# Patient Record
Sex: Female | Born: 1975
Health system: Southern US, Community
[De-identification: ages and names within clinical notes are randomized; demographics above are authoritative.]

## PROBLEM LIST (undated history)

## (undated) DIAGNOSIS — G7100 Muscular dystrophy, unspecified: Secondary | ICD-10-CM

## (undated) DIAGNOSIS — F329 Major depressive disorder, single episode, unspecified: Secondary | ICD-10-CM

## (undated) DIAGNOSIS — F419 Anxiety disorder, unspecified: Secondary | ICD-10-CM

## (undated) DIAGNOSIS — S82899A Other fracture of unspecified lower leg, initial encounter for closed fracture: Secondary | ICD-10-CM

## (undated) DIAGNOSIS — E559 Vitamin D deficiency, unspecified: Secondary | ICD-10-CM

## (undated) DIAGNOSIS — R079 Chest pain, unspecified: Secondary | ICD-10-CM

## (undated) DIAGNOSIS — K589 Irritable bowel syndrome without diarrhea: Secondary | ICD-10-CM

## (undated) DIAGNOSIS — E7800 Pure hypercholesterolemia, unspecified: Secondary | ICD-10-CM

## (undated) DIAGNOSIS — F32A Depression, unspecified: Secondary | ICD-10-CM

## (undated) DIAGNOSIS — D219 Benign neoplasm of connective and other soft tissue, unspecified: Secondary | ICD-10-CM

## (undated) HISTORY — DX: Pure hypercholesterolemia, unspecified: E78.00

## (undated) HISTORY — DX: Chest pain, unspecified: R07.9

## (undated) HISTORY — PX: OTHER SURGICAL HISTORY: SHX169

## (undated) HISTORY — DX: Vitamin D deficiency, unspecified: E55.9

## (undated) HISTORY — DX: Muscular dystrophy, unspecified: G71.00

## (undated) HISTORY — DX: Anxiety disorder, unspecified: F41.9

## (undated) HISTORY — DX: Other fracture of unspecified lower leg, initial encounter for closed fracture: S82.899A

## (undated) HISTORY — DX: Irritable bowel syndrome, unspecified: K58.9

## (undated) HISTORY — DX: Benign neoplasm of connective and other soft tissue, unspecified: D21.9

## (undated) HISTORY — PX: TUBAL LIGATION: SHX77

## (undated) HISTORY — DX: Depression, unspecified: F32.A

## (undated) HISTORY — DX: Major depressive disorder, single episode, unspecified: F32.9

---

## 2012-04-20 LAB — HM PAP SMEAR

## 2012-10-14 ENCOUNTER — Ambulatory Visit: Payer: Self-pay | Admitting: Family Medicine

## 2012-10-16 ENCOUNTER — Encounter: Payer: Self-pay | Admitting: Neurology

## 2012-11-03 ENCOUNTER — Encounter: Payer: Self-pay | Admitting: Neurology

## 2013-01-24 ENCOUNTER — Ambulatory Visit: Payer: Self-pay | Admitting: Unknown Physician Specialty

## 2013-05-27 DIAGNOSIS — G7111 Myotonic muscular dystrophy: Secondary | ICD-10-CM | POA: Insufficient documentation

## 2013-06-17 ENCOUNTER — Ambulatory Visit: Payer: Self-pay | Admitting: Pain Medicine

## 2013-09-17 ENCOUNTER — Emergency Department: Payer: Self-pay | Admitting: Emergency Medicine

## 2013-09-17 LAB — BASIC METABOLIC PANEL
ANION GAP: 5 — AB (ref 7–16)
BUN: 12 mg/dL (ref 7–18)
CALCIUM: 9.4 mg/dL (ref 8.5–10.1)
Chloride: 103 mmol/L (ref 98–107)
Co2: 31 mmol/L (ref 21–32)
Creatinine: 0.63 mg/dL (ref 0.60–1.30)
Glucose: 108 mg/dL — ABNORMAL HIGH (ref 65–99)
Osmolality: 278 (ref 275–301)
Potassium: 3.9 mmol/L (ref 3.5–5.1)
SODIUM: 139 mmol/L (ref 136–145)

## 2013-09-17 LAB — CBC
HCT: 42.6 % (ref 35.0–47.0)
HGB: 14.3 g/dL (ref 12.0–16.0)
MCH: 31 pg (ref 26.0–34.0)
MCHC: 33.5 g/dL (ref 32.0–36.0)
MCV: 92 fL (ref 80–100)
PLATELETS: 218 10*3/uL (ref 150–440)
RBC: 4.61 10*6/uL (ref 3.80–5.20)
RDW: 13.5 % (ref 11.5–14.5)
WBC: 9.2 10*3/uL (ref 3.6–11.0)

## 2013-09-17 LAB — TROPONIN I

## 2013-09-17 LAB — PRO B NATRIURETIC PEPTIDE: B-TYPE NATIURETIC PEPTID: 59 pg/mL (ref 0–125)

## 2013-09-18 LAB — TROPONIN I

## 2013-09-18 LAB — URINALYSIS, COMPLETE
Bilirubin,UR: NEGATIVE
GLUCOSE, UR: NEGATIVE mg/dL (ref 0–75)
KETONE: NEGATIVE
NITRITE: NEGATIVE
PROTEIN: NEGATIVE
Ph: 6 (ref 4.5–8.0)
RBC,UR: 14 /HPF (ref 0–5)
SPECIFIC GRAVITY: 1.02 (ref 1.003–1.030)
Squamous Epithelial: 9
WBC UR: 4 /HPF (ref 0–5)

## 2013-10-16 ENCOUNTER — Emergency Department: Payer: Self-pay | Admitting: Emergency Medicine

## 2013-11-10 ENCOUNTER — Emergency Department: Payer: Self-pay | Admitting: Internal Medicine

## 2013-11-10 LAB — COMPREHENSIVE METABOLIC PANEL
ANION GAP: 4 — AB (ref 7–16)
Albumin: 2.8 g/dL — ABNORMAL LOW (ref 3.4–5.0)
Alkaline Phosphatase: 133 U/L — ABNORMAL HIGH
BILIRUBIN TOTAL: 0.4 mg/dL (ref 0.2–1.0)
BUN: 9 mg/dL (ref 7–18)
CO2: 30 mmol/L (ref 21–32)
Calcium, Total: 9.2 mg/dL (ref 8.5–10.1)
Chloride: 108 mmol/L — ABNORMAL HIGH (ref 98–107)
Creatinine: 0.85 mg/dL (ref 0.60–1.30)
EGFR (African American): >60
GLUCOSE: 108 mg/dL — AB (ref 65–99)
Osmolality: 282 (ref 275–301)
POTASSIUM: 4.2 mmol/L (ref 3.5–5.1)
SGOT(AST): 77 U/L — ABNORMAL HIGH (ref 15–37)
SGPT (ALT): 50 U/L
Sodium: 142 mmol/L (ref 136–145)
Total Protein: 6.4 g/dL (ref 6.4–8.2)

## 2013-11-10 LAB — CBC
HCT: 53.5 % — ABNORMAL HIGH (ref 35.0–47.0)
HGB: 17 g/dL — ABNORMAL HIGH (ref 12.0–16.0)
MCH: 29.4 pg (ref 26.0–34.0)
MCHC: 31.8 g/dL — AB (ref 32.0–36.0)
MCV: 93 fL (ref 80–100)
Platelet: 209 10*3/uL (ref 150–440)
RBC: 5.78 10*6/uL — AB (ref 3.80–5.20)
RDW: 13.5 % (ref 11.5–14.5)
WBC: 12 10*3/uL — ABNORMAL HIGH (ref 3.6–11.0)

## 2013-11-24 ENCOUNTER — Emergency Department: Payer: Self-pay | Admitting: Student

## 2013-11-24 LAB — COMPREHENSIVE METABOLIC PANEL
Albumin: 3 g/dL — ABNORMAL LOW (ref 3.4–5.0)
Alkaline Phosphatase: 98 U/L
Anion Gap: 4 — ABNORMAL LOW (ref 7–16)
BUN: 6 mg/dL — ABNORMAL LOW (ref 7–18)
Bilirubin,Total: 0.5 mg/dL (ref 0.2–1.0)
CO2: 30 mmol/L (ref 21–32)
Calcium, Total: 8.9 mg/dL (ref 8.5–10.1)
Chloride: 108 mmol/L — ABNORMAL HIGH (ref 98–107)
Creatinine: 0.7 mg/dL (ref 0.60–1.30)
Glucose: 88 mg/dL (ref 65–99)
Osmolality: 280 (ref 275–301)
Potassium: 3.9 mmol/L (ref 3.5–5.1)
SGOT(AST): 35 U/L (ref 15–37)
SGPT (ALT): 32 U/L
Sodium: 142 mmol/L (ref 136–145)
TOTAL PROTEIN: 6.3 g/dL — AB (ref 6.4–8.2)

## 2013-11-24 LAB — URINALYSIS, COMPLETE
BILIRUBIN, UR: NEGATIVE
Bacteria: NONE SEEN
Glucose,UR: NEGATIVE mg/dL (ref 0–75)
KETONE: NEGATIVE
NITRITE: NEGATIVE
Ph: 7 (ref 4.5–8.0)
Protein: NEGATIVE
RBC,UR: 1 /HPF (ref 0–5)
Specific Gravity: 1.01 (ref 1.003–1.030)

## 2013-11-24 LAB — CBC
HCT: 43.6 % (ref 35.0–47.0)
HGB: 13.9 g/dL (ref 12.0–16.0)
MCH: 29.2 pg (ref 26.0–34.0)
MCHC: 31.9 g/dL — ABNORMAL LOW (ref 32.0–36.0)
MCV: 92 fL (ref 80–100)
Platelet: 213 10*3/uL (ref 150–440)
RBC: 4.76 10*6/uL (ref 3.80–5.20)
RDW: 13.5 % (ref 11.5–14.5)
WBC: 8 10*3/uL (ref 3.6–11.0)

## 2013-11-24 LAB — LIPASE, BLOOD: Lipase: 93 U/L (ref 73–393)

## 2013-11-24 LAB — HCG, QUANTITATIVE, PREGNANCY: Beta Hcg, Quant.: <1 m[IU]/mL — ABNORMAL LOW

## 2013-11-25 LAB — URINE CULTURE

## 2013-12-20 ENCOUNTER — Emergency Department (HOSPITAL_COMMUNITY)
Admission: EM | Admit: 2013-12-20 | Discharge: 2013-12-20 | Disposition: A | Discharge status: Home / Self Care | Payer: Medicare Other | Attending: Emergency Medicine | Admitting: Emergency Medicine

## 2013-12-20 ENCOUNTER — Encounter (HOSPITAL_COMMUNITY): Payer: Self-pay | Admitting: Emergency Medicine

## 2013-12-20 DIAGNOSIS — Z9851 Tubal ligation status: Secondary | ICD-10-CM | POA: Insufficient documentation

## 2013-12-20 DIAGNOSIS — Z3202 Encounter for pregnancy test, result negative: Secondary | ICD-10-CM | POA: Insufficient documentation

## 2013-12-20 DIAGNOSIS — G8929 Other chronic pain: Secondary | ICD-10-CM | POA: Insufficient documentation

## 2013-12-20 DIAGNOSIS — N390 Urinary tract infection, site not specified: Secondary | ICD-10-CM

## 2013-12-20 DIAGNOSIS — R197 Diarrhea, unspecified: Secondary | ICD-10-CM | POA: Insufficient documentation

## 2013-12-20 HISTORY — DX: Muscular dystrophy, unspecified: G71.00

## 2013-12-20 LAB — CBC WITH DIFFERENTIAL/PLATELET
BASOS ABS: 0 10*3/uL (ref 0.0–0.1)
Basophils Relative: 0 % (ref 0–1)
EOS ABS: 0.1 10*3/uL (ref 0.0–0.7)
EOS PCT: 1 % (ref 0–5)
HCT: 40.7 % (ref 36.0–46.0)
Hemoglobin: 13.6 g/dL (ref 12.0–15.0)
Lymphocytes Relative: 37 % (ref 12–46)
Lymphs Abs: 3.3 10*3/uL (ref 0.7–4.0)
MCH: 30 pg (ref 26.0–34.0)
MCHC: 33.4 g/dL (ref 30.0–36.0)
MCV: 89.8 fL (ref 78.0–100.0)
MONOS PCT: 6 % (ref 3–12)
Monocytes Absolute: 0.5 10*3/uL (ref 0.1–1.0)
NEUTROS PCT: 56 % (ref 43–77)
Neutro Abs: 4.9 10*3/uL (ref 1.7–7.7)
PLATELETS: 215 10*3/uL (ref 150–400)
RBC: 4.53 MIL/uL (ref 3.87–5.11)
RDW: 13.4 % (ref 11.5–15.5)
WBC: 8.7 10*3/uL (ref 4.0–10.5)

## 2013-12-20 LAB — URINALYSIS, ROUTINE W REFLEX MICROSCOPIC
BILIRUBIN URINE: NEGATIVE
GLUCOSE, UA: NEGATIVE mg/dL
KETONES UR: NEGATIVE mg/dL
Leukocytes, UA: NEGATIVE
Nitrite: NEGATIVE
Protein, ur: NEGATIVE mg/dL
Specific Gravity, Urine: 1.025 (ref 1.005–1.030)
Urobilinogen, UA: 0.2 mg/dL (ref 0.0–1.0)
pH: 6 (ref 5.0–8.0)

## 2013-12-20 LAB — URINE MICROSCOPIC-ADD ON

## 2013-12-20 LAB — COMPREHENSIVE METABOLIC PANEL
ALT: 17 U/L (ref 0–35)
AST: 18 U/L (ref 0–37)
Albumin: 3 g/dL — ABNORMAL LOW (ref 3.5–5.2)
Alkaline Phosphatase: 76 U/L (ref 39–117)
Anion gap: 5 (ref 5–15)
BILIRUBIN TOTAL: 0.2 mg/dL — AB (ref 0.3–1.2)
BUN: 8 mg/dL (ref 6–23)
CO2: 31 mEq/L (ref 19–32)
CREATININE: 0.77 mg/dL (ref 0.50–1.10)
Calcium: 8.8 mg/dL (ref 8.4–10.5)
Chloride: 105 mEq/L (ref 96–112)
GFR calc Af Amer: >90 mL/min (ref >90)
Glucose, Bld: 96 mg/dL (ref 70–99)
Potassium: 3.4 mEq/L — ABNORMAL LOW (ref 3.7–5.3)
Sodium: 141 mEq/L (ref 137–147)
Total Protein: 5.6 g/dL — ABNORMAL LOW (ref 6.0–8.3)

## 2013-12-20 LAB — LIPASE, BLOOD: Lipase: 29 U/L (ref 11–59)

## 2013-12-20 LAB — POC URINE PREG, ED: PREG TEST UR: NEGATIVE

## 2013-12-20 MED ORDER — ONDANSETRON HCL 4 MG/2ML IJ SOLN
4.0000 mg | Freq: Once | INTRAMUSCULAR | Status: AC
  Administered 2013-12-20: 4 mg via INTRAVENOUS
  Filled 2013-12-20: qty 2

## 2013-12-20 MED ORDER — SODIUM CHLORIDE 0.9 % IV SOLN
Freq: Once | INTRAVENOUS | Status: AC
  Administered 2013-12-20: 13:00:00 via INTRAVENOUS

## 2013-12-20 MED ORDER — HYDROCODONE-ACETAMINOPHEN 5-325 MG PO TABS: ORAL_TABLET | ORAL | Status: DC

## 2013-12-20 MED ORDER — MORPHINE SULFATE 4 MG/ML IJ SOLN
4.0000 mg | Freq: Once | INTRAMUSCULAR | Status: AC
  Administered 2013-12-20: 4 mg via INTRAVENOUS
  Filled 2013-12-20: qty 1

## 2013-12-20 MED ORDER — KETOROLAC TROMETHAMINE 30 MG/ML IJ SOLN
30.0000 mg | Freq: Once | INTRAMUSCULAR | Status: AC
  Administered 2013-12-20: 30 mg via INTRAVENOUS
  Filled 2013-12-20: qty 1

## 2013-12-20 MED ORDER — CEPHALEXIN 500 MG PO CAPS: 500.0000 mg | ORAL_CAPSULE | Freq: Four times a day (QID) | ORAL | Status: DC

## 2013-12-20 NOTE — Discharge Instructions (Signed)
Chronic Pain Chronic pain can be defined as pain that is off and on and lasts for 3-6 months or longer. Many things cause chronic pain, which can make it difficult to make a diagnosis. There are many treatment options available for chronic pain. However, finding a treatment that works well for you may require trying various approaches until the right one is found. Many people benefit from a combination of two or more types of treatment to control their pain. SYMPTOMS  Chronic pain can occur anywhere in the body and can range from mild to very severe. Some types of chronic pain include:  Headache.  Low back pain.  Cancer pain.  Arthritis pain.  Neurogenic pain. This is pain resulting from damage to nerves. People with chronic pain may also have other symptoms such as:  Depression.  Anger.  Insomnia.  Anxiety. DIAGNOSIS  Your health care provider will help diagnose your condition over time. In many cases, the initial focus will be on excluding possible conditions that could be causing the pain. Depending on your symptoms, your health care provider may order tests to diagnose your condition. Some of these tests may include:   Blood tests.   CT scan.   MRI.   X-rays.   Ultrasounds.   Nerve conduction studies.  You may need to see a specialist.  TREATMENT  Finding treatment that works well may take time. You may be referred to a pain specialist. He or she may prescribe medicine or therapies, such as:   Mindful meditation or yoga.  Shots (injections) of numbing or pain-relieving medicines into the spine or area of pain.  Local electrical stimulation.  Acupuncture.   Massage therapy.   Aroma, color, light, or sound therapy.   Biofeedback.   Working with a physical therapist to keep from getting stiff.   Regular, gentle exercise.   Cognitive or behavioral therapy.   Group support.  Sometimes, surgery may be recommended.  HOME CARE INSTRUCTIONS    Take all medicines as directed by your health care provider.   Lessen stress in your life by relaxing and doing things such as listening to calming music.   Exercise or be active as directed by your health care provider.   Eat a healthy diet and include things such as vegetables, fruits, fish, and lean meats in your diet.   Keep all follow-up appointments with your health care provider.   Attend a support group with others suffering from chronic pain. SEEK MEDICAL CARE IF:   Your pain gets worse.   You develop a new pain that was not there before.   You cannot tolerate medicines given to you by your health care provider.   You have new symptoms since your last visit with your health care provider.  SEEK IMMEDIATE MEDICAL CARE IF:   You feel weak.   You have decreased sensation or numbness.   You lose control of bowel or bladder function.   Your pain suddenly gets much worse.   You develop shaking.  You develop chills.  You develop confusion.  You develop chest pain.  You develop shortness of breath.  MAKE SURE YOU:  Understand these instructions.  Will watch your condition.  Will get help right away if you are not doing well or get worse. Document Released: 11/11/2001 Document Revised: 10/22/2012 Document Reviewed: 08/15/2012 [facility] Patient Information 2015 [place], [place]. This information is not intended to replace advice given to you by your health care provider. Make sure you discuss any  questions you have with your health care provider.  [facility] Primary Care Doctor List    Sinda Du MD. Specialty: Pulmonary Disease Contact information: [place]  [place] [address]  [phone]   Tula Nakayama, MD. Specialty: [facility] Medicine Contact information: [address] [place] [address]  [phone]   Sallee Lange, MD. Specialty: Family Medicine Contact information: [address]   [place] [address]  [phone]   Rosita Fire, MD Specialty: Internal Medicine Contact information: [place] [address]  [phone]   Delphina Cahill, MD. Specialty: Internal Medicine Contact information: [place] [address]  [phone]   Marjean Donna, MD. Specialty: Family Medicine Contact information: [place] [address]  [phone]   Leslie Andrea, MD. Specialty: Family Medicine Contact information: [address]  [place] [address]  [phone]   Asencion Noble, MD. Specialty: Internal Medicine Contact information: [address]  [place] [address]  [phone]    Emergency Department Resource Guide 1) Find a Doctor and Pay Out of Pocket Although you won't have to find out who is covered by your insurance plan, it is a good idea to ask around and get recommendations. You will then need to call the office and see if the doctor you have chosen will accept you as a new patient and what types of options they offer for patients who are self-pay. Some doctors offer discounts or will set up payment plans for their patients who do not have insurance, but you will need to ask so you aren't surprised when you get to your appointment.  2) Contact Your Local Health Department Not all health departments have doctors that can see patients for sick visits, but many do, so it is worth a call to see if yours does. If you don't know where your local health department is, you can check in your phone book. The CDC also has a tool to help you locate your state's health department, and many state websites also have listings of all of their local health departments.  3) Find a [facility] If your illness is not likely to be very severe or complicated, you may want to try a walk in clinic. These are popping up all over the country in pharmacies, drugstores, and shopping centers.  They're usually staffed by nurse practitioners or physician assistants that have been trained to treat common illnesses and complaints. They're usually fairly quick and inexpensive. However, if you have serious medical issues or chronic medical problems, these are probably not your best option.  No Primary Care Doctor: - Call Health Connect at  [phone] - they can help you locate a primary care doctor that  accepts your insurance, provides certain services, etc. - Physician Referral Service- [phone]  Chronic Pain Problems: Organization         Address  Phone   Notes  [facility]  [phone] Patients need to be referred by their primary care doctor.   Medication Assistance: Organization         Address  Phone   Notes  [facility] Medication [facility] [address] [place], [place] [address] [phone] --Must be a resident of [facility] -- Must have NO insurance coverage whatsoever (no Medicaid/ Medicare, etc.) -- The pt. MUST have a primary  care doctor that directs their care regularly and follows them in the community   MedAssist  [phone]   [employer]  [phone]    Agencies that provide inexpensive medical care: Organization         Address  Phone   Notes  [place]  [phone]   [facility]    [phone]   [facility] [place], [place] [address] [phone]   [place] [address] [place] [phone]   Planned Parenthood    [phone]   [facility]    [phone]   [place] and [place] [address], [place] Phone:  [phone], Fax:  [phone] Hours of Operation:  9 am - 6 pm, M-F.  Also accepts Medicaid/Medicare and self-pay.  [facility] for [place], [address], [place] Phone: [phone], Fax: [phone]. Hours of Operation:  8:30 am - 5:30 pm, M-F.  Also accepts Medicaid and self-pay.  [facility] [place] [address] [place] Phone: [phone]   [place], [place], [place] [phone], Ext. [phone] Mondays & Thursdays: 7-9 AM.  First 15 patients are seen on a first come, first serve basis.    [place] Providers:  Organization         Address  Phone   Notes  [facility] [address], [place] [phone] Also accepts self-pay patients.  [facility] [address], [place]  [phone]   [place], [address], [place] [phone]   [facility] [address] [place] [phone]   Lucianne Lei [address], [place]   [phone] Only accepts [place] Access [place] patients after they have their name applied to their card.   Self-Pay (no insurance) in [facility]:  Organization         Address  Phone   Notes  Sickle Cell Patients, [facility] [place] [phone]   [facility] [place] [phone]   [facility] [place]  [place], [place], [place] [phone]   [facility]/Dr. Osei-Bonsu  [address], [place] or [address], [place] [phone] Phone number for both [place] and [place] locations is the same.  Urgent Medical and [facility] [address] [place] [phone]   [facility] [address] [address], [place] or [address] [phone]   [facility] [address] [place] [phone], phone; [phone], fax Sees patients 1st and 3rd Saturday of every month.  Must not qualify for public or private insurance (i.e.  Medicaid, Medicare, [place] Health Choice, Veterans' Benefits)  Household income should be no more than 200% of the poverty level The clinic cannot treat you if you are pregnant or think you are pregnant  Sexually transmitted diseases are not treated at the clinic.    Dental Care: Organization         Address  Phone  Notes  [facility] Department of [facility] [address] [place], [place] [phone]  Accepts children up to age 22 who are enrolled in Medicaid or [place] Health Choice; pregnant women with a Medicaid card; and children who have applied for Medicaid or [place] Health Choice, but were declined, whose parents can pay a reduced fee at time of service.  [facility] Department of [facility]  [address], [place] [phone] Accepts children up to age 73 who are enrolled in [place] or [place]; pregnant women with a Medicaid card; and children who have applied for Medicaid or  Health Choice, but were declined, whose parents can pay a reduced fee at time of service.  [place] Adult Dental Access PROGRAM  [place] [phone] Patients are seen by appointment only. Walk-ins are not accepted. [place] will see patients 55 years of age and older. Monday - Tuesday (8am-5pm) Most Wednesdays (8:30-5pm) $30 per visit, cash only  [facility] Adult Dental Access PROGRAM  [address], [facility] [phone] Patients are seen by appointment only. Walk-ins are not accepted. [facility] will see patients 43 years of age and older. One Wednesday Evening (Monthly: Volunteer Based).  $30 per visit, cash only  [place]  [phone] for adults; Children under age 64, call Graduate Pediatric Dentistry at [phone]. Children aged 39-14, please call [phone] to request a pediatric application.  Dental services are provided in all areas of dental care including fillings,  crowns and bridges, complete and partial dentures, implants, gum treatment, root canals, and extractions. Preventive care is also provided. Treatment is provided to both adults and children. Patients are selected via a lottery and there is often a waiting list.   [facility] [address] [place]  [phone] [email or web address]   [facility] [address] [place], [place] [phone], Ext. [phone] Second and Fourth Thursday of each month, opens at 6:30 AM; Clinic ends at 9 AM.  Patients are seen on a first-come first-served basis, and a limited number are seen during each clinic.   [facility]  [address] Hillard Danker [place], [place] [phone]   Eligibility Requirements You must have lived in [place], [place], or [place] counties for at least the last three months.   You cannot be eligible for state or federal sponsored [employer], including [employer], [place], or [employer].   You generally cannot be eligible for healthcare insurance through your employer.    How to apply: Eligibility screenings are held every Tuesday and Wednesday afternoon from 1:00 pm until 4:00 pm. You do not need an appointment for the interview!  [facility] [address] [place], [place]   [place]  [place] Department  [place]  [phone]    Behavioral Health Resources in the Community: Intensive Outpatient Programs Organization         Address  Phone  Notes  [place]. [address] [place], [place] [phone]   [facility] Outpatient [address] [place], [place]   ADS: Alcohol & Drug Svcs [address], [place], [place]   [place] [address],  [place], [place] or [phone]   Substance Abuse  Resources Organization         Address  Phone  Notes  Alcohol and Drug Services  [phone]  [place]  [phone]   The [place]   Chinita Pester  [phone]   Residential & Outpatient Substance Abuse Program  [phone]   Psychological Services Organization         Address  Phone  Notes  [facility] [place]  [place]  [phone]   [place] [address], [place] or [phone]    Mobile Crisis Teams Organization         Address  Phone  Notes  [facility], Mobile Crisis Care Unit  [phone]   [facility]  [address] [place], [place]   [place] Levels [address], [place] [phone]    Self-Help/Support Groups Organization         Address  Phone             Notes  [place]. of [place] - variety of support groups  [place] Call for more information  Narcotics Anonymous (NA), [facility] [address], [employer] [place]  2 meetings at this location   Special educational needs teacher         Address  Phone  Notes  [facility] Residential Treatment [place],    [place]  [phone]   [facility]  [address], [place] [phone], [place], [place]   [place], [place] [phone] Admissions: 8am-3pm M-F  Incentives Substance [place] [address]    [place], [place] [phone]   [facility] [address] [place], [place], [place]   [facility] [address],  [place], [place]   Insight Programs - Intensive Outpatient [address], [place], [place]   [facility]edic Surgery ([address]ng[address]  [place], [place] [phone] or [phone]   [facility]idential Treatment Services (RTS) [address], [place], [place] Accepts Medicaid  [facility] [place] [address],  [place] [phone] Substance Abuse/Addiction Treatment   [facility] Organization         Address  Phone  Notes  [facility]  [phone]   Domenic Schwab, PhD [address] [place], [place]   [phone] or [phone]   [place], [place] [phone]   [facility] [address], [place], [place] [phone] Insurance/Medicaid/sponsorship through [facility] Families [address], [address] [place]                                    [place], [place] [phone] [place] [address], [place] [phone]    Dr. Adele Schilder  [phone]   [facility] Dept. 1) [address] [place] 2) [place] 3)  [place], [place] [phone]  [phone]   [place] [phone] or [phone] (After Hours)

## 2013-12-20 NOTE — ED Notes (Signed)
Pt reports has muscular dystrophy and has had worsening pain this past year.  Reports in the past pain has been controlled by ibuprofen 800mg  but says it is not helping.

## 2013-12-20 NOTE — ED Provider Notes (Signed)
CSN: [id number]     Arrival date & time 12/20/13  1140 History  This chart was scribed for non-physician practitioner, Kem Parkinson, PA-C,working with Nat Christen, MD by Marlowe Kays, ED Scribe. This patient was seen in room APA11/APA11 and the patient's care was started at 12:40 PM.  Chief Complaint  Patient presents with  . generalized pain    The history is provided by the patient. No language interpreter was used.   HPI Comments:  Sheryl Barker is a 38 y.o. female with PMHx of Muscular Dystrophy and hyperlipidemia who presents to the Emergency Department complaining of generalized body aches onset one week. Pt states she also has had intermittent lower abdominal pain  for the past two weeks and left-sided otalgia onset one month ago. She reports having her ears flushed at that time and has had the ear ache since. She reports associated intermittent diarrhea. She has taken Ibuprofen 800 mg with no significant relief of the pain. She denies vomiting, HA, numbness or weakness to the extremities,fever or chest pain,  frequency in urination or malodorous urine. Pt states she does not have a PCP at this time secondary to insurance issues. Pt reports she is ambulatory without assistance for short periods of time. She is currently menstruating.   Past Medical History  Diagnosis Date  . Muscular dystrophy    Past Surgical History  Procedure Laterality Date  . Tubal ligation     No family history on file. History  Substance Use Topics  . Smoking status: Never Smoker   . Smokeless tobacco: Not on file  . Alcohol Use: No   OB History   Grav Para Term Preterm Abortions TAB SAB Ect Mult Living                 Review of Systems  Constitutional: Negative for fever, chills and fatigue.  HENT: Positive for ear pain. Negative for sore throat and trouble swallowing.   Respiratory: Negative for cough, chest tightness and shortness of breath.   Cardiovascular: Negative for chest pain and  palpitations.  Gastrointestinal: Positive for abdominal pain and diarrhea. Negative for nausea, vomiting, blood in stool and abdominal distention.  Genitourinary: Negative for dysuria, frequency, hematuria, flank pain, vaginal discharge and menstrual problem.  Musculoskeletal: Positive for myalgias (generalized). Negative for arthralgias, back pain, neck pain and neck stiffness.  Skin: Negative for rash.  Neurological: Negative for dizziness, syncope, weakness, numbness and headaches.  Hematological: Does not bruise/bleed easily.  All other systems reviewed and are negative.   Allergies  Ultram  Home Medications   Prior to Admission medications   Medication Sig Start Date End Date Taking? Authorizing Provider  ibuprofen (ADVIL,MOTRIN) 200 MG tablet Take 800 mg by mouth every 8 (eight) hours as needed for moderate pain.   Yes Historical Provider, MD   Triage Vitals: BP 105/60  Pulse 69  Temp(Src) 98.4 F (36.9 C) (Oral)  Resp 18  Ht 5\' 6"  (1.676 m)  Wt 194 lb (87.998 kg)  BMI 31.33 kg/m2  SpO2 98%  LMP 12/15/2013 Physical Exam  Nursing note and vitals reviewed. Constitutional: She is oriented to person, place, and time. She appears well-developed and well-nourished.  HENT:  Head: Normocephalic and atraumatic.  Right Ear: Tympanic membrane normal.  Left Ear: Tympanic membrane normal.  Mouth/Throat: Uvula is midline, oropharynx is clear and moist and mucous membranes are normal.  Eyes: Conjunctivae and EOM are normal. Pupils are equal, round, and reactive to light.  Neck: Normal range of  motion.  Cardiovascular: Normal rate, regular rhythm, normal heart sounds and intact distal pulses.  Exam reveals no gallop and no friction rub.   No murmur heard. DP pulses equal and bilateral.  Pulmonary/Chest: Effort normal and breath sounds normal. No respiratory distress. She has no wheezes. She has no rales. She exhibits no tenderness.  Abdominal: Soft. She exhibits no distension. There  is tenderness. There is no rebound and no guarding.  Suprapubic tenderness. Mild RLQ tenderness. No guarding or rebound tenderness.    Musculoskeletal: Normal range of motion. She exhibits no edema.  Pt has full ROM of all extremities, mild tenderness to palpation of the upper arms and thighs.  No erythema or edema.  Compartments of the extremities are soft.    Lymphadenopathy:    She has no cervical adenopathy.  Neurological: She is alert and oriented to person, place, and time. She exhibits normal muscle tone. Coordination normal.  5/5 strength against resistance.  Skin: Skin is warm and dry.  Psychiatric: She has a normal mood and affect. Her behavior is normal.    ED Course  Procedures (including critical care time) DIAGNOSTIC STUDIES: Oxygen Saturation is 98% on RA, normal by my interpretation.   COORDINATION OF CARE: 12:50 PM- Will order lab work, IV and urinalysis. Pt verbalizes understanding and agrees to plan.  Medications  0.9 %  sodium chloride infusion ( Intravenous New Bag/Given 12/20/13 1307)  ketorolac (TORADOL) 30 MG/ML injection 30 mg (30 mg Intravenous Given 12/20/13 1308)  ondansetron (ZOFRAN) injection 4 mg (4 mg Intravenous Given 12/20/13 1308)  morphine 4 MG/ML injection 4 mg (4 mg Intravenous Given 12/20/13 1355)    Labs Review Labs Reviewed  COMPREHENSIVE METABOLIC PANEL - Abnormal; Notable for the following:    Potassium 3.4 (*)    Total Protein 5.6 (*)    Albumin 3.0 (*)    Total Bilirubin 0.2 (*)    All other components within normal limits  URINALYSIS, ROUTINE W REFLEX MICROSCOPIC - Abnormal; Notable for the following:    APPearance HAZY (*)    Hgb urine dipstick LARGE (*)    All other components within normal limits  URINE MICROSCOPIC-ADD ON - Abnormal; Notable for the following:    Squamous Epithelial / LPF FEW (*)    Bacteria, UA MANY (*)    All other components within normal limits  CBC WITH DIFFERENTIAL  LIPASE, BLOOD  POC URINE PREG, ED     Imaging Review No results found.   EKG Interpretation None     Urine culture pending  MDM   Final diagnoses:  Chronic pain  Urinary tract infection without complication    Pt is well appearing, non-toxic.  Labs are unremarkable except likely UTI. No concerning sx's for acute abdomen.   Pt has diffuse pain which is chronic and reported to be related to muscular dystrophy.  Pt has recently moved to area so referral for PMD was given.  I have discussed pt hx and exam findings with Dr. Lacinda Axon.  Pt is feeling better and requesting d/c.  She agrees to arrange f/u with a PMD.  Rx for vicodin and keflex.  I personally performed the services described in this documentation, which was scribed in my presence. The recorded information has been reviewed and is accurate.    Koltin Wehmeyer L. Vanessa Price, PA-C 12/22/13 2159

## 2013-12-20 NOTE — ED Notes (Signed)
Discharge instructions and prescriptions given and reviewed with patient.  Patient verbalized understanding to complete all antibiotic and sedating effects of pain medicine.  Patient ambulatory; discharged home in good condition.  Patient's significant other accompanied to drive home.

## 2013-12-22 LAB — URINE CULTURE

## 2013-12-25 NOTE — ED Provider Notes (Signed)
Medical screening examination/treatment/procedure(s) were performed by non-physician practitioner and as supervising physician I was immediately available for consultation/collaboration.   EKG Interpretation None       Nat Christen, MD 12/25/13 1844

## 2014-05-04 DIAGNOSIS — F329 Major depressive disorder, single episode, unspecified: Secondary | ICD-10-CM | POA: Insufficient documentation

## 2014-05-04 DIAGNOSIS — F32A Depression, unspecified: Secondary | ICD-10-CM | POA: Insufficient documentation

## 2014-05-04 DIAGNOSIS — Z8659 Personal history of other mental and behavioral disorders: Secondary | ICD-10-CM | POA: Insufficient documentation

## 2014-05-04 DIAGNOSIS — R0681 Apnea, not elsewhere classified: Secondary | ICD-10-CM | POA: Insufficient documentation

## 2014-05-04 DIAGNOSIS — E785 Hyperlipidemia, unspecified: Secondary | ICD-10-CM | POA: Insufficient documentation

## 2014-05-04 DIAGNOSIS — R079 Chest pain, unspecified: Secondary | ICD-10-CM

## 2014-05-04 DIAGNOSIS — E782 Mixed hyperlipidemia: Secondary | ICD-10-CM | POA: Insufficient documentation

## 2014-05-04 HISTORY — DX: Chest pain, unspecified: R07.9

## 2014-05-05 DIAGNOSIS — E669 Obesity, unspecified: Secondary | ICD-10-CM | POA: Insufficient documentation

## 2014-05-05 DIAGNOSIS — E538 Deficiency of other specified B group vitamins: Secondary | ICD-10-CM | POA: Insufficient documentation

## 2014-05-05 DIAGNOSIS — G723 Periodic paralysis: Secondary | ICD-10-CM | POA: Insufficient documentation

## 2014-06-19 ENCOUNTER — Emergency Department
Admit: 2014-06-19 | Disposition: A | Discharge status: Home / Self Care | Payer: Self-pay | Admitting: Emergency Medicine

## 2014-06-19 LAB — URINALYSIS, COMPLETE
BILIRUBIN, UR: NEGATIVE
GLUCOSE, UR: NEGATIVE mg/dL (ref 0–75)
Ketone: NEGATIVE
Leukocyte Esterase: NEGATIVE
NITRITE: NEGATIVE
PH: 6 (ref 4.5–8.0)
PROTEIN: NEGATIVE
SPECIFIC GRAVITY: 1.021 (ref 1.003–1.030)

## 2014-06-19 LAB — COMPREHENSIVE METABOLIC PANEL
ALBUMIN: 3.7 g/dL
Alkaline Phosphatase: 108 U/L
Anion Gap: 5 — ABNORMAL LOW (ref 7–16)
BUN: 20 mg/dL
Bilirubin,Total: 0.5 mg/dL
Calcium, Total: 9.2 mg/dL
Chloride: 107 mmol/L
Co2: 26 mmol/L
Creatinine: 0.63 mg/dL
Glucose: 108 mg/dL — ABNORMAL HIGH
Potassium: 3.9 mmol/L
SGOT(AST): 53 U/L — ABNORMAL HIGH
SGPT (ALT): 46 U/L
SODIUM: 138 mmol/L
TOTAL PROTEIN: 7.1 g/dL

## 2014-06-19 LAB — CBC
HCT: 42.6 % (ref 35.0–47.0)
HGB: 14.1 g/dL (ref 12.0–16.0)
MCH: 29.3 pg (ref 26.0–34.0)
MCHC: 33.1 g/dL (ref 32.0–36.0)
MCV: 89 fL (ref 80–100)
Platelet: 214 10*3/uL (ref 150–440)
RBC: 4.81 10*6/uL (ref 3.80–5.20)
RDW: 14.3 % (ref 11.5–14.5)
WBC: 11.7 10*3/uL — ABNORMAL HIGH (ref 3.6–11.0)

## 2014-06-19 LAB — TROPONIN I: Troponin-I: <0.03 ng/mL

## 2014-08-10 ENCOUNTER — Other Ambulatory Visit: Payer: Self-pay | Admitting: Neurology

## 2014-08-10 DIAGNOSIS — M79605 Pain in left leg: Secondary | ICD-10-CM

## 2014-08-13 ENCOUNTER — Ambulatory Visit
Admission: RE | Admit: 2014-08-13 | Discharge: 2014-08-13 | Disposition: A | Discharge status: Home / Self Care | Payer: BLUE CROSS/BLUE SHIELD | Source: Ambulatory Visit | Attending: Neurology | Admitting: Neurology

## 2014-08-13 DIAGNOSIS — M5126 Other intervertebral disc displacement, lumbar region: Secondary | ICD-10-CM | POA: Insufficient documentation

## 2014-08-13 DIAGNOSIS — M5136 Other intervertebral disc degeneration, lumbar region: Secondary | ICD-10-CM | POA: Insufficient documentation

## 2014-08-13 DIAGNOSIS — M79605 Pain in left leg: Secondary | ICD-10-CM

## 2014-08-26 ENCOUNTER — Telehealth: Payer: Self-pay | Admitting: Family Medicine

## 2014-08-26 NOTE — Telephone Encounter (Signed)
Please ask her to try Metamucil, Miralax, Docusate Sodium (one of these not all at once) or Enema kit all found OTC. Can try each of these remedies but not all in one day.

## 2014-08-26 NOTE — Telephone Encounter (Signed)
PT SAID THAT SHE IS TAKING AN ANTI INFLAMMATORY  DRUG( [place]) AND WANTS TO KNOW IF SHE CAN TAKE A OVER THE COUNTER DRUG FOR CONSTIPATION. SAID THAT SHE IS REALLY BAD.

## 2014-08-27 NOTE — Telephone Encounter (Signed)
I tried to contact this patient to discuss Dr. Allie Dimmer recommendations but there was no answer. A basic message was left encouraging her to try the otc meds listed below and to increase her water intake, but that she could give [place] a call if she had any additional questions.

## 2014-09-15 ENCOUNTER — Encounter: Payer: Self-pay | Admitting: Family Medicine

## 2014-09-28 ENCOUNTER — Ambulatory Visit: Payer: Self-pay | Admitting: Family Medicine

## 2014-09-29 ENCOUNTER — Encounter: Payer: Self-pay | Admitting: Family Medicine

## 2014-11-10 DIAGNOSIS — D259 Leiomyoma of uterus, unspecified: Secondary | ICD-10-CM | POA: Insufficient documentation

## 2015-01-06 ENCOUNTER — Ambulatory Visit: Payer: BLUE CROSS/BLUE SHIELD | Admitting: Pain Medicine

## 2015-01-07 ENCOUNTER — Ambulatory Visit: Payer: BLUE CROSS/BLUE SHIELD | Attending: Pain Medicine | Admitting: Pain Medicine

## 2015-01-07 ENCOUNTER — Other Ambulatory Visit: Payer: Self-pay | Admitting: Pain Medicine

## 2015-01-07 ENCOUNTER — Other Ambulatory Visit
Admission: RE | Admit: 2015-01-07 | Discharge: 2015-01-07 | Disposition: A | Discharge status: Home / Self Care | Payer: Medicare Other | Source: Ambulatory Visit | Attending: Pain Medicine | Admitting: Pain Medicine

## 2015-01-07 ENCOUNTER — Encounter: Payer: Self-pay | Admitting: Pain Medicine

## 2015-01-07 VITALS — BP 104/73 | HR 71 | Temp 98.0°F | Resp 18 | Ht 66.0 in | Wt 190.0 lb

## 2015-01-07 DIAGNOSIS — M549 Dorsalgia, unspecified: Secondary | ICD-10-CM | POA: Insufficient documentation

## 2015-01-07 DIAGNOSIS — G7111 Myotonic muscular dystrophy: Secondary | ICD-10-CM | POA: Insufficient documentation

## 2015-01-07 DIAGNOSIS — M545 Low back pain, unspecified: Secondary | ICD-10-CM

## 2015-01-07 DIAGNOSIS — M797 Fibromyalgia: Secondary | ICD-10-CM

## 2015-01-07 DIAGNOSIS — M79603 Pain in arm, unspecified: Secondary | ICD-10-CM

## 2015-01-07 DIAGNOSIS — E669 Obesity, unspecified: Secondary | ICD-10-CM | POA: Insufficient documentation

## 2015-01-07 DIAGNOSIS — Z87440 Personal history of urinary (tract) infections: Secondary | ICD-10-CM | POA: Insufficient documentation

## 2015-01-07 DIAGNOSIS — E781 Pure hyperglyceridemia: Secondary | ICD-10-CM | POA: Insufficient documentation

## 2015-01-07 DIAGNOSIS — M7918 Myalgia, other site: Secondary | ICD-10-CM | POA: Insufficient documentation

## 2015-01-07 DIAGNOSIS — F1190 Opioid use, unspecified, uncomplicated: Secondary | ICD-10-CM | POA: Diagnosis not present

## 2015-01-07 DIAGNOSIS — F1120 Opioid dependence, uncomplicated: Secondary | ICD-10-CM

## 2015-01-07 DIAGNOSIS — Z79891 Long term (current) use of opiate analgesic: Secondary | ICD-10-CM | POA: Insufficient documentation

## 2015-01-07 DIAGNOSIS — E559 Vitamin D deficiency, unspecified: Secondary | ICD-10-CM

## 2015-01-07 DIAGNOSIS — M47896 Other spondylosis, lumbar region: Secondary | ICD-10-CM

## 2015-01-07 DIAGNOSIS — M5416 Radiculopathy, lumbar region: Secondary | ICD-10-CM

## 2015-01-07 DIAGNOSIS — E538 Deficiency of other specified B group vitamins: Secondary | ICD-10-CM | POA: Insufficient documentation

## 2015-01-07 DIAGNOSIS — Z79899 Other long term (current) drug therapy: Secondary | ICD-10-CM | POA: Diagnosis not present

## 2015-01-07 DIAGNOSIS — M4316 Spondylolisthesis, lumbar region: Secondary | ICD-10-CM | POA: Insufficient documentation

## 2015-01-07 DIAGNOSIS — M47816 Spondylosis without myelopathy or radiculopathy, lumbar region: Secondary | ICD-10-CM | POA: Insufficient documentation

## 2015-01-07 DIAGNOSIS — M79605 Pain in left leg: Secondary | ICD-10-CM | POA: Insufficient documentation

## 2015-01-07 DIAGNOSIS — M79602 Pain in left arm: Secondary | ICD-10-CM

## 2015-01-07 DIAGNOSIS — M4726 Other spondylosis with radiculopathy, lumbar region: Secondary | ICD-10-CM

## 2015-01-07 DIAGNOSIS — G8929 Other chronic pain: Secondary | ICD-10-CM | POA: Diagnosis not present

## 2015-01-07 DIAGNOSIS — F419 Anxiety disorder, unspecified: Secondary | ICD-10-CM | POA: Insufficient documentation

## 2015-01-07 DIAGNOSIS — M4310 Spondylolisthesis, site unspecified: Secondary | ICD-10-CM | POA: Insufficient documentation

## 2015-01-07 DIAGNOSIS — M489 Spondylopathy, unspecified: Secondary | ICD-10-CM

## 2015-01-07 DIAGNOSIS — Z5181 Encounter for therapeutic drug level monitoring: Secondary | ICD-10-CM

## 2015-01-07 DIAGNOSIS — M79601 Pain in right arm: Secondary | ICD-10-CM

## 2015-01-07 DIAGNOSIS — E785 Hyperlipidemia, unspecified: Secondary | ICD-10-CM | POA: Insufficient documentation

## 2015-01-07 DIAGNOSIS — F411 Generalized anxiety disorder: Secondary | ICD-10-CM | POA: Insufficient documentation

## 2015-01-07 HISTORY — DX: Vitamin D deficiency, unspecified: E55.9

## 2015-01-07 LAB — C-REACTIVE PROTEIN: CRP: 0.7 mg/dL (ref <1.0)

## 2015-01-07 LAB — COMPREHENSIVE METABOLIC PANEL
ALBUMIN: 3.7 g/dL (ref 3.5–5.0)
ALT: 35 U/L (ref 14–54)
ANION GAP: 3 — AB (ref 5–15)
AST: 27 U/L (ref 15–41)
Alkaline Phosphatase: 103 U/L (ref 38–126)
BILIRUBIN TOTAL: 0.6 mg/dL (ref 0.3–1.2)
BUN: 10 mg/dL (ref 6–20)
CO2: 28 mmol/L (ref 22–32)
Calcium: 9.4 mg/dL (ref 8.9–10.3)
Chloride: 108 mmol/L (ref 101–111)
Creatinine, Ser: 0.67 mg/dL (ref 0.44–1.00)
GFR calc Af Amer: >60 mL/min (ref >60)
GFR calc non Af Amer: >60 mL/min (ref >60)
GLUCOSE: 90 mg/dL (ref 65–99)
POTASSIUM: 3.7 mmol/L (ref 3.5–5.1)
SODIUM: 139 mmol/L (ref 135–145)
TOTAL PROTEIN: 7 g/dL (ref 6.5–8.1)

## 2015-01-07 LAB — MAGNESIUM: Magnesium: 2 mg/dL (ref 1.7–2.4)

## 2015-01-07 LAB — SEDIMENTATION RATE: Sed Rate: 12 mm/hr (ref 0–20)

## 2015-01-07 MED ORDER — OXYCODONE HCL 5 MG PO CAPS: 5.0000 mg | ORAL_CAPSULE | Freq: Three times a day (TID) | ORAL | Status: DC | PRN

## 2015-01-07 MED ORDER — GABAPENTIN 100 MG PO CAPS: 300.0000 mg | ORAL_CAPSULE | Freq: Every day | ORAL | Status: DC

## 2015-01-07 NOTE — Progress Notes (Signed)
Ran out of oxycodone 5-6 days ago and ran out of gabapentin a week ago

## 2015-01-07 NOTE — Patient Instructions (Signed)
GENERAL RISKS AND COMPLICATIONS  What are the risk, side effects and possible complications? Generally speaking, most procedures are safe.  However, with any procedure there are risks, side effects, and the possibility of complications.  The risks and complications are dependent upon the sites that are lesioned, or the type of nerve Mcman[place] to be performed.  The closer the procedure is to the spine, the more serious the risks are.  Great care is taken when placing the radio frequency needles, [place]aelsen needles or lesioning probes, but sometimes complications can occur. 1. Infection: Any time there is an injection through the skin, there is a risk of infection.  This is why sterile conditions are used for these blocks.  There are four possible types of infection. 1. Localized skin infection. 2. Central Nervous System Infection-This can be in the form of Meningitis, which can be deadly. 3. Epidural Infections-This can be in the form of an epidural abscess, which can cause pressure inside of the spine, causing compression of the spinal cord with subsequent paralysis. This would require an emergency surgery to decompress, and there are no guarantees that the patient would recover from the paralysis. 4. Discitis-This is an infection of the intervertebral discs.  It occurs in about 1% of discography procedures.  It is difficult to treat and it may lead to surgery.        2. Pain: the needles have to go through skin and soft tissues, will cause soreness.       3. Damage to internal structures:  The nerves to be lesioned may be near blood vessels or    other nerves which can be potentially damaged.       4. Bleeding: Bleeding is more common if the patient is taking blood thinners such as  aspirin, Coumadin, Ticiid, Plavix, etc., or if he/she have some genetic predisposition  such as hemophilia. Bleeding into the spinal canal can cause compression of the spinal  cord with subsequent paralysis.  This would require an  emergency surgery to  decompress and there are no guarantees that the patient would recover from the  paralysis.       5. Pneumothorax:  Puncturing of a lung is a possibility, every time a needle is introduced in  the area of the chest or upper back.  Pneumothorax refers to free air around the  collapsed lung(s), inside of the thoracic cavity (chest cavity).  Another two possible  complications related to a similar event would include: Hemothorax and Chylothorax.   These are variations of the Pneumothorax, where instead of air around the collapsed  lung(s), you may have blood or chyle, respectively.       6. Spinal headaches: They may occur with any procedures in the area of the spine.       7. Persistent CSF (Cerebro-Spinal Fluid) leakage: This is a rare problem, but may occur  with prolonged intrathecal or epidural catheters either due to the formation of a fistulous  track or a dural tear.       8. Nerve damage: By working so close to the spinal cord, there is always a possibility of  nerve damage, which could be as serious as a permanent spinal cord injury with  paralysis.       9. Death:  Although rare, severe deadly allergic reactions known as "Anaphylactic  reaction" can occur to any of the medications used.      10. Worsening of the symptoms:  We can always make thing worse.    What are the chances of something like this happening? Chances of any of this occuring are extremely low.  By statistics, you have more of a chance of getting killed in a motor vehicle accident: while driving to the hospital than any of the above occurring .  Nevertheless, you should be aware that they are possibilities.  In general, it is similar to taking a shower.  Everybody knows that you can slip, hit your head and get killed.  Does that mean that you should not shower again?  Nevertheless always keep in mind that statistics do not mean anything if you happen to be on the wrong side of them.  Even if a procedure has a 1  (one) in a 1,000,000 (million) chance of going wrong, it you happen to be that one..Also, keep in mind that by statistics, you have more of a chance of having something go wrong when taking medications.  Who should not have this procedure? If you are on a blood thinning medication (e.g. Coumadin, Plavix, see list of "Blood Thinners"), or if you have an active infection going on, you should not have the procedure.  If you are taking any blood thinners, please inform your physician.  How should I prepare for this procedure?  Do not eat or drink anything at least six hours prior to the procedure.  Bring a driver with you .  It cannot be a taxi.  Come accompanied by an adult that can drive you back, and that is strong enough to help you if your legs get weak or numb from the local anesthetic.  Take all of your medicines the morning of the procedure with just enough water to swallow them.  If you have diabetes, make sure that you are scheduled to have your procedure done first thing in the morning, whenever possible.  If you have diabetes, take only half of your insulin dose and notify our nurse that you have done so as soon as you arrive at the clinic.  If you are diabetic, but only take blood sugar pills (oral hypoglycemic), then do not take them on the morning of your procedure.  You may take them after you have had the procedure.  Do not take aspirin or any aspirin-containing medications, at least eleven (11) days prior to the procedure.  They may prolong bleeding.  Wear loose fitting clothing that may be easy to take off and that you would not mind if it got stained with Betadine or blood.  Do not wear any jewelry or perfume  Remove any nail coloring.  It will interfere with some of our monitoring equipment.  NOTE: Remember that this is not meant to be interpreted as a complete list of all possible complications.  Unforeseen problems may occur.  BLOOD THINNERS The following drugs  contain aspirin or other products, which can cause increased bleeding during surgery and should not be taken for 2 weeks prior to and 1 week after surgery.  If you should need take something for relief of minor pain, you may take acetaminophen which is found in Tylenol,m Datril, Anacin-3 and Panadol. It is not blood thinner. The products listed below are.  Do not take any of the products listed below in addition to any listed on your instruction sheet.  A.P.C or A.P.C with Codeine Codeine Phosphate Capsules #3 Ibuprofen Ridaura  ABC compound Congesprin Imuran rimadil  Advil Cope Indocin Robaxisal  Alka-Seltzer Effervescent Pain Reliever and Antacid Coricidin or Coricidin-D  Indomethacin Rufen    Alka-Seltzer plus Cold Medicine Cosprin Ketoprofen S-A-C Tablets  Anacin Analgesic Tablets or Capsules Coumadin Korlgesic Salflex  Anacin Extra Strength Analgesic tablets or capsules CP-2 Tablets Lanoril Salicylate  Anaprox Cuprimine Capsules Levenox Salocol  Anexsia-D Dalteparin Magan Salsalate  Anodynos Darvon compound Magnesium Salicylate Sine-off  Ansaid Dasin Capsules Magsal Sodium Salicylate  Anturane Depen Capsules Marnal Soma  APF Arthritis pain formula Dewitt's Pills Measurin Stanback  Argesic Dia-Gesic Meclofenamic Sulfinpyrazone  Arthritis Bayer Timed Release Aspirin Diclofenac Meclomen Sulindac  Arthritis pain formula Anacin Dicumarol Medipren Supac  Analgesic (Safety coated) Arthralgen Diffunasal Mefanamic Suprofen  Arthritis Strength Bufferin Dihydrocodeine Mepro Compound Suprol  Arthropan liquid Dopirydamole Methcarbomol with Aspirin Synalgos  ASA tablets/Enseals Disalcid Micrainin Tagament  Ascriptin Doan's Midol Talwin  Ascriptin A/D Dolene Mobidin Tanderil  Ascriptin Extra Strength Dolobid Moblgesic Ticlid  Ascriptin with Codeine Doloprin or Doloprin with Codeine Momentum Tolectin  Asperbuf Duoprin Mono-gesic Trendar  Aspergum Duradyne Motrin or Motrin IB Triminicin  Aspirin  plain, buffered or enteric coated Durasal Myochrisine Trigesic  Aspirin Suppositories Easprin Nalfon Trillsate  Aspirin with Codeine Ecotrin Regular or Extra Strength Naprosyn Uracel  Atromid-S Efficin Naproxen Ursinus  Auranofin Capsules Elmiron Neocylate Vanquish  Axotal Emagrin Norgesic Verin  Azathioprine Empirin or Empirin with Codeine Normiflo Vitamin E  Azolid Emprazil Nuprin Voltaren  Bayer Aspirin plain, buffered or children's or timed BC Tablets or powders Encaprin Orgaran Warfarin Sodium  Buff-a-Comp Enoxaparin Orudis Zorpin  Buff-a-Comp with Codeine Equegesic Os-Cal-Gesic   Buffaprin Excedrin plain, buffered or Extra Strength Oxalid   Bufferin Arthritis Strength Feldene Oxphenbutazone   Bufferin plain or Extra Strength Feldene Capsules Oxycodone with Aspirin   Bufferin with Codeine Fenoprofen Fenoprofen Pabalate or Pabalate-SF   Buffets II Flogesic Panagesic   Buffinol plain or Extra Strength Florinal or Florinal with Codeine Panwarfarin   Buf-Tabs Flurbiprofen Penicillamine   Butalbital Compound Four-way cold tablets Penicillin   Butazolidin Fragmin Pepto-Bismol   Carbenicillin Geminisyn Percodan   Carna Arthritis Reliever Geopen Persantine   Carprofen Gold's salt Persistin   Chloramphenicol Goody's Phenylbutazone   Chloromycetin Haltrain Piroxlcam   Clmetidine heparin Plaquenil   Cllnoril Hyco-pap Ponstel   Clofibrate Hydroxy chloroquine Propoxyphen         Before stopping any of these medications, be sure to consult the physician who ordered them.  Some, such as Coumadin (Warfarin) are ordered to prevent or treat serious conditions such as "deep thrombosis", "pumonary embolisms", and other heart problems.  The amount of time that you may need off of the medication may also vary with the medication and the reason for which you were taking it.  If you are taking any of these medications, please make sure you notify your pain physician before you undergo any  procedures.         Epidural Steroid Injection Patient Information  Description: The epidural space surrounds the nerves as they exit the spinal cord.  In some patients, the nerves can be compressed and inflamed by a bulging disc or a tight spinal canal (spinal stenosis).  By injecting steroids into the epidural space, we can bring irritated nerves into direct contact with a potentially helpful medication.  These steroids act directly on the irritated nerves and can reduce swelling and inflammation which often leads to decreased pain.  Epidural steroids may be injected anywhere along the spine and from the neck to the low back depending upon the location of your pain.   After numbing the skin with local anesthetic (like Novocaine), a small needle is passed   into the epidural space slowly.  You may experience a sensation of pressure while this is being done.  The entire Spade usually last less than 10 minutes.  Conditions which may be treated by epidural steroids:   Low back and leg pain  Neck and arm pain  Spinal stenosis  Post-laminectomy syndrome  Herpes zoster (shingles) pain  Pain from compression fractures  Preparation for the injection:  1. Do not eat any solid food or dairy products within 6 hours of your appointment.  2. You may drink clear liquids up to 2 hours before appointment.  Clear liquids include water, black coffee, juice or soda.  No milk or cream please. 3. You may take your regular medication, including pain medications, with a sip of water before your appointment  Diabetics should hold regular insulin (if taken separately) and take 1/2 normal NPH dos the morning of the procedure.  Carry some sugar containing items with you to your appointment. 4. A driver must accompany you and be prepared to drive you home after your procedure.  5. Bring all your current medications with your. 6. An IV may be inserted and sedation may be given at the discretion of the  physician.   7. A blood pressure cuff, EKG and other monitors will often be applied during the procedure.  Some patients may need to have extra oxygen administered for a short period. 8. You will be asked to provide medical information, including your allergies, prior to the procedure.  We must know immediately if you are taking blood thinners (like Coumadin/Warfarin)  Or if you are allergic to IV iodine contrast (dye). We must know if you could possible be pregnant.  Possible side-effects:  Bleeding from needle site  Infection (rare, may require surgery)  Nerve injury (rare)  Numbness & tingling (temporary)  Difficulty urinating (rare, temporary)  Spinal headache ( a headache worse with upright posture)  Light -headedness (temporary)  Pain at injection site (several days)  Decreased blood pressure (temporary)  Weakness in arm/leg (temporary)  Pressure sensation in back/neck (temporary)  Call if you experience:  Fever/chills associated with headache or increased back/neck pain.  Headache worsened by an upright position.  New onset weakness or numbness of an extremity below the injection site  Hives or difficulty breathing (go to the emergency room)  Inflammation or drainage at the infection site  Severe back/neck pain  Any new symptoms which are concerning to you  Please note:  Although the local anesthetic injected can often make your back or neck feel good for several hours after the injection, the pain will likely return.  It takes 3-7 days for steroids to work in the epidural space.  You may not notice any pain relief for at least that one week.  If effective, we will often do a series of three injections spaced 3-6 weeks apart to maximally decrease your pain.  After the initial series, we generally will wait several months before considering a repeat injection of the same type.  If you have any questions, please call [phone] [facility] 

## 2015-01-07 NOTE — Progress Notes (Signed)
Safety precautions to be maintained throughout the outpatient stay will include: orient to surroundings, keep bed in low position, maintain call bell within reach at all times, provide assistance with transfer out of bed and ambulation. Oxycodone pill count # 0

## 2015-01-07 NOTE — Progress Notes (Signed)
Patient's Name: Sheryl Barker MRN: [id number] DOB: [birth year] DOS: 01/07/2015  Primary Reason(s) for Visit: Encounter for Medication Management. CC: Back Pain and Leg Pain   HPI:   Sheryl Barker is a 39 y.o. year old, female patient, who returns today as an established patient. 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[place]); Avitaminosis D; Adynamia; Chest pain; Long term current use of opiate analgesic; Long term prescription opiate use; Opiate use; Opiate dependence ([place]); Encounter for therapeutic drug level monitoring; Chronic low back pain (L>R); Lumbar spondylosis (L>R); Chronic pain of left lower extremity; Chronic lumbar radicular pain (left side) (L5 Dermatome); Myotonic dystrophy, 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 Her primarily concern today is the Back Pain and Leg Pain    Today we have taken the time to review her complaints and condition. The patient indicates that her primary pain is the lower extremity pain with the left side being worse than the right. In the case of the right lower extremity the pain goes to the anterior portion of her thigh but it does not go any further down from there. Case of the left lower extremity the pain also goes to the frontal aspect of the thigh and groin area but it extends down in a dermatomal distribution to the top of the foot in what seems to be an L5 dermatomal pain. Her second pain is the lower back pain where she describes it to be worse on the right than the left. Complicating matters is  the fact that the patient has a myotonic dystrophy type II which also causes musculoskeletal pain and this part is being managed by Sheryl Barker [facility]). Today's Pain Score: 8  Pain Type: Chronic pain Pain Location: Back (left leg and left buttock) Pain Orientation: Left Pain Descriptors / Indicators: Sharp Pain Frequency: Intermittent  Date of Last Visit: Date of Last Visit: 09/16/14 Service Provided on Last Visit: Service Provided on Last Visit: Med Refill  Pharmacotherapy Review:   Side-effects or Adverse reactions: None reported. Effectiveness: Described as relatively effective, allowing for increase in activities of daily living (ADL). Onset of action: Within expected pharmacological parameters. Duration of action: Within normal limits for medication. Peak effect: Timing and results are as within normal expected parameters. [place] PMP: Compliant with practice rules and regulations. DST: Compliant with practice rules and regulations. Lab work: No new labs ordered by our practice. Treatment compliance: Compliant. Substance Use Disorder (SUD) Risk Level: Low Planned course of action: Continue therapy as is.  Post-Procedure Assessment:  Procedure done on last visit: Left L4-5 lumbar epidural steroid injection #1 under fluoroscopic guidance, without sedation on 09/08/2014. Side-effects or Adverse reactions: No significant issues reported. Sedation: No sedation used during procedure.  Results: Ultra-Short Term Relief (First 1 hour after procedure): 80 % Short Term Relief (Initial 4-6 hrs after procedure): 90 % Long Term Relief : 60 %  Current Relief (Now):  90 % Interpretation of Results: Short-term relief confirms injected site as etiology of pain. Long term relief is possibly due to sympathetic blockade, or the effects of steroids, if administered during procedure. Persistent relief would suggest effective anti-inflammatory effects from steroids. Based on the  response of  this patient to the lumbar epidural steroid injection and after having reviewed her MRI, I would agree that she had a multilevel lumbar radiculitis on the left side. Because she did so well with the lumbar epidural steroid injection, we have given her the option of repeating this injection when necessary.  Allergies: Sheryl Barker is allergic to ultram.  Meds: The patient has a current medication list which includes the following prescription(s): vitamin d3, diclofenac, duloxetine, fenofibrate, gabapentin, mexiletine, oxycodone, cephalexin, oxycodone, and oxycodone. Requested Prescriptions   Signed Prescriptions Disp Refills  . oxycodone (OXY-IR) 5 MG capsule 90 capsule 0    Sig: Take 1 capsule (5 mg total) by mouth every 8 (eight) hours as needed.  . gabapentin (NEURONTIN) 100 MG capsule 90 capsule 2    Sig: Take 3 capsules (300 mg total) by mouth at bedtime.  Marland Kitchen oxycodone (OXY-IR) 5 MG capsule 90 capsule 0    Sig: Take 1 capsule (5 mg total) by mouth every 8 (eight) hours as needed.  Marland Kitchen oxycodone (OXY-IR) 5 MG capsule 90 capsule 0    Sig: Take 1 capsule (5 mg total) by mouth every 8 (eight) hours as needed.    ROS: Constitutional: Afebrile, no chills, well hydrated and well nourished Gastrointestinal: negative Musculoskeletal:negative Neurological: negative Behavioral/Psych: negative  PFSH: Medical:  Sheryl Barker  has a past medical history of Muscular dystrophy ([place]); MD (muscular dystrophy) ([place]); Hypercholesteremia; IBS (irritable bowel syndrome); Depression; Anxiety; and Chest pain (05/04/2014). Family: family history includes Hypertension in her father; Muscular dystrophy in her mother; Stroke in her mother. Surgical:  has past surgical history that includes Tubal ligation. Tobacco:  reports that she has never smoked. She does not have any smokeless tobacco history on file. Alcohol:  reports that she does not drink alcohol. Drug:  reports that she does not use illicit  drugs.  Physical Exam: Vitals:  Today's Vitals   01/07/15 0811 01/07/15 0813  BP:  104/73  Pulse: 71   Temp: 98 F (36.7 C)   Resp: 18   Height: 5\' 6"  (1.676 m)   Weight: 190 lb (86.183 kg)   SpO2: 100%   PainSc: 8  8   PainLoc: Back   Calculated BMI: Body mass index is 30.68 kg/(m^2). General appearance: alert, cooperative, appears stated age, no distress and moderately obese Eyes: conjunctivae/corneas clear. PERRL, EOM's intact. Fundi benign. Lungs: No evidence respiratory distress, no audible rales or ronchi and no use of accessory muscles of respiration Neck: no adenopathy, no carotid bruit, no JVD, supple, symmetrical, trachea midline and thyroid not enlarged, symmetric, no tenderness/mass/nodules Back: symmetric, no curvature. ROM normal. No CVA tenderness. Extremities: extremities normal, atraumatic, no cyanosis or edema Pulses: 2+ and symmetric Skin: Skin color, texture, turgor normal. No rashes or lesions Neurologic: Grossly normal    Assessment: Encounter Diagnosis:  Primary Diagnosis: Chronic pain [G89.29]  Plan: Aubrianna was seen today for back pain and leg pain.  Diagnoses and all orders for this visit:  Chronic pain -     COMPLETE METABOLIC PANEL WITH GFR; Future -     C-reactive protein; Future -     Magnesium; Future -     Sedimentation rate; Future -     Vitamin D2,D3 Panel; Future -     oxycodone (OXY-IR) 5 MG capsule; Take 1 capsule (5 mg total) by mouth every 8 (eight) hours as needed. -     gabapentin (NEURONTIN) 100 MG capsule; Take 3 capsules (300 mg total) by mouth at bedtime. -  oxycodone (OXY-IR) 5 MG capsule; Take 1 capsule (5 mg total) by mouth every 8 (eight) hours as needed. -     oxycodone (OXY-IR) 5 MG capsule; Take 1 capsule (5 mg total) by mouth every 8 (eight) hours as needed.  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 Generally speaking, most procedures are safe.  However, with any procedure there are risks, side effects, and the possibility of complications.  The risks and complications are dependent upon the sites that are lesioned, or the type of nerve Loyal to be performed.  The closer the procedure is to the spine, the more serious the risks are.  Great care is taken when placing the radio frequency needles, Aker needles or lesioning probes, but sometimes complications can occur. 1. Infection: Any time there is an injection through the skin, there is a risk of infection.  This is why sterile conditions are used for these blocks.  There are four possible types of infection. 1. Localized skin infection. 2. Central Nervous System Infection-This can be in the form of Meningitis, which can be deadly. 3. Epidural Infections-This can be in the form of an epidural abscess, which can cause pressure inside of the spine, causing compression of the spinal cord with subsequent paralysis. This would require an emergency surgery to decompress, and there are no guarantees that the patient would recover from the paralysis. 4. Discitis-This is an infection of the intervertebral discs.  It occurs in  about 1% of discography procedures.  It is difficult to treat and it may lead to surgery.        2. Pain: the needles have to go through skin and soft tissues, will cause soreness.       3. Damage to internal structures:  The nerves to be lesioned may be near blood vessels or    other nerves which can be potentially damaged.       4. Bleeding: Bleeding is more common if the patient is taking blood thinners such as  aspirin, Coumadin, Ticiid, Plavix, etc., or if he/she have some genetic predisposition  such as hemophilia. Bleeding into the spinal canal can cause compression of the spinal  cord with subsequent paralysis.  This would require an emergency surgery to  decompress and there are no guarantees that the patient would recover from the  paralysis.       5. Pneumothorax:  Puncturing of a lung is a possibility, every time a needle is introduced in  the area of the chest or upper back.  Pneumothorax refers to free air around the  collapsed lung(s), inside of the thoracic cavity (chest cavity).  Another two possible  complications related to a similar  event would include: Hemothorax and Chylothorax.   These are variations of the Pneumothorax, where instead of air around the collapsed  lung(s), you may have blood or chyle, respectively.       6. Spinal headaches: They may occur with any procedures in the area of the spine.       7. Persistent CSF (Cerebro-Spinal Fluid) leakage: This is a rare problem, but may occur  with prolonged intrathecal or epidural catheters either due to the formation of a fistulous  track or a dural tear.       8. Nerve damage: By working so close to the spinal cord, there is always a possibility of  nerve damage, which could be as serious as a permanent spinal cord injury with  paralysis.       9. Death:  Although rare, severe deadly allergic reactions known as "Anaphylactic  reaction" can occur to any of the medications used.      10. Worsening of the symptoms:  We can always  make thing worse.  What are the chances of something like this happening? Chances of any of this occuring are extremely low.  By statistics, you have more of a chance of getting killed in a motor vehicle accident: while driving to the hospital than any of the above occurring .  Nevertheless, you should be aware that they are possibilities.  In general, it is similar to taking a shower.  Everybody knows that you can slip, hit your head and get killed.  Does that mean that you should not shower again?  Nevertheless always keep in mind that statistics do not mean anything if you happen to be on the wrong side of them.  Even if a procedure has a 1 (one) in a 1,000,000 (million) chance of going wrong, it you happen to be that one..Also, keep in mind that by statistics, you have more of a chance of having something go wrong when taking medications.  Who should not have this procedure? If you are on a blood thinning medication (e.g. Coumadin, Plavix, see list of "Blood Thinners"), or if you have an active infection going on, you should not have the procedure.  If you are taking any blood thinners, please inform your physician.  How should I prepare for this procedure?  Do not eat or drink anything at least six hours prior to the procedure.  Bring a driver with you .  It cannot be a taxi.  Come accompanied by an adult that can drive you back, and that is strong enough to help you if your legs get weak or numb from the local anesthetic.  Take all of your medicines the morning of the procedure with just enough water to swallow them.  If you have diabetes, make sure that you are scheduled to have your procedure done first thing in the morning, whenever possible.  If you have diabetes, take only half of your insulin dose and notify our nurse that you have done so as soon as you arrive at the clinic.  If you are diabetic, but only take blood sugar pills (oral hypoglycemic), then do not take them on the  morning of your procedure.  You may take them after you have had the procedure.  Do not take aspirin or any aspirin-containing medications, at least eleven (11) days prior to the procedure.  They may prolong bleeding.  Wear loose fitting clothing that may be easy to take off and that you would not mind if  it got stained with Betadine or blood.  Do not wear any jewelry or perfume  Remove any nail coloring.  It will interfere with some of our monitoring equipment.  NOTE: Remember that this is not meant to be interpreted as a complete list of all possible complications.  Unforeseen problems may occur.  BLOOD THINNERS The following drugs contain aspirin or other products, which can cause increased bleeding during surgery and should not be taken for 2 weeks prior to and 1 week after surgery.  If you should need take something for relief of minor pain, you may take acetaminophen which is found in Tylenol,m Datril, Anacin-3 and Panadol. It is not blood thinner. The products listed below are.  Do not take any of the products listed below in addition to any listed on your instruction sheet.  A.P.C or A.P.C with Codeine Codeine Phosphate Capsules #3 Ibuprofen Ridaura  ABC compound Congesprin Imuran rimadil  Advil Cope Indocin Robaxisal  Alka-Seltzer Effervescent Pain Reliever and Antacid Coricidin or Coricidin-D  Indomethacin Rufen  Alka-Seltzer plus Cold Medicine Cosprin Ketoprofen S-A-C Tablets  Anacin Analgesic Tablets or Capsules Coumadin Korlgesic Salflex  Anacin Extra Strength Analgesic tablets or capsules CP-2 Tablets Lanoril Salicylate  Anaprox Cuprimine Capsules Levenox Salocol  Anexsia-D Dalteparin Magan Salsalate  Anodynos Darvon compound Magnesium Salicylate Sine-off  Ansaid Dasin Capsules Magsal Sodium Salicylate  Anturane Depen Capsules Marnal Soma  APF Arthritis pain formula Dewitt's Pills Measurin Stanback  Argesic Dia-Gesic Meclofenamic Sulfinpyrazone  Arthritis Bayer Timed  Release Aspirin Diclofenac Meclomen Sulindac  Arthritis pain formula Anacin Dicumarol Medipren Supac  Analgesic (Safety coated) Arthralgen Diffunasal Mefanamic Suprofen  Arthritis Strength Bufferin Dihydrocodeine Mepro Compound Suprol  Arthropan liquid Dopirydamole Methcarbomol with Aspirin Synalgos  ASA tablets/Enseals Disalcid Micrainin Tagament  Ascriptin Doan's Midol Talwin  Ascriptin A/D Dolene Mobidin Tanderil  Ascriptin Extra Strength Dolobid Moblgesic Ticlid  Ascriptin with Codeine Doloprin or Doloprin with Codeine Momentum Tolectin  Asperbuf Duoprin Mono-gesic Trendar  Aspergum Duradyne Motrin or Motrin IB Triminicin  Aspirin plain, buffered or enteric coated Durasal Myochrisine Trigesic  Aspirin Suppositories Easprin Nalfon Trillsate  Aspirin with Codeine Ecotrin Regular or Extra Strength Naprosyn Uracel  Atromid-S Efficin Naproxen Ursinus  Auranofin Capsules Elmiron Neocylate Vanquish  Axotal Emagrin Norgesic Verin  Azathioprine Empirin or Empirin with Codeine Normiflo Vitamin E  Azolid Emprazil Nuprin Voltaren  Bayer Aspirin plain, buffered or children's or timed BC Tablets or powders Encaprin Orgaran Warfarin Sodium  Buff-a-Comp Enoxaparin Orudis Zorpin  Buff-a-Comp with Codeine Equegesic Os-Cal-Gesic   Buffaprin Excedrin plain, buffered or Extra Strength Oxalid   Bufferin Arthritis Strength Feldene Oxphenbutazone   Bufferin plain or Extra Strength Feldene Capsules Oxycodone with Aspirin   Bufferin with Codeine Fenoprofen Fenoprofen Pabalate or Pabalate-SF   Buffets II Flogesic Panagesic   Buffinol plain or Extra Strength Florinal or Florinal with Codeine Panwarfarin   Buf-Tabs Flurbiprofen Penicillamine   Butalbital Compound Four-way cold tablets Penicillin   Butazolidin Fragmin Pepto-Bismol   Carbenicillin Geminisyn Percodan   Carna Arthritis Reliever Geopen Persantine   Carprofen Gold's salt Persistin   Chloramphenicol Goody's Phenylbutazone   Chloromycetin  [place] Piroxlcam   Clmetidine heparin Plaquenil   Cllnoril Hyco-pap Ponstel   Clofibrate Hydroxy chloroquine Propoxyphen         Before stopping any of these medications, be sure to consult the physician who ordered them.  Some, such as Coumadin (Warfarin) are ordered to prevent or treat serious conditions such as "deep thrombosis", "pumonary embolisms", and other heart problems.  The amount of  time that you may need off of the medication may also vary with the medication and the reason for which you were taking it.  If you are taking any of these medications, please make sure you notify your pain physician before you undergo any procedures.         Epidural Steroid Injection Patient Information  Description: The epidural space surrounds the nerves as they exit the spinal cord.  In some patients, the nerves can be compressed and inflamed by a bulging disc or a tight spinal canal (spinal stenosis).  By injecting steroids into the epidural space, we can bring irritated nerves into direct contact with a potentially helpful medication.  These steroids act directly on the irritated nerves and can reduce swelling and inflammation which often leads to decreased pain.  Epidural steroids may be injected anywhere along the spine and from the neck to the low back depending upon the location of your pain.   After numbing the skin with local anesthetic (like Novocaine), a small needle is passed into the epidural space slowly.  You may experience a sensation of pressure while this is being done.  The entire [place] usually last less than 10 minutes.  Conditions which may be treated by epidural steroids:   Low back and leg pain  Neck and arm pain  Spinal stenosis  Post-laminectomy syndrome  Herpes zoster (shingles) pain  Pain from compression fractures  Preparation for the injection:  1. Do not eat any solid food or dairy products within 6 hours of your appointment.  2. You may drink clear  liquids up to 2 hours before appointment.  Clear liquids include water, black coffee, juice or soda.  No milk or cream please. 3. You may take your regular medication, including pain medications, with a sip of water before your appointment  Diabetics should hold regular insulin (if taken separately) and take 1/2 normal NPH dos the morning of the procedure.  Carry some sugar containing items with you to your appointment. 4. A driver must accompany you and be prepared to drive you home after your procedure.  5. Bring all your current medications with your. 6. An IV may be inserted and sedation may be given at the discretion of the physician.   7. A blood pressure cuff, EKG and other monitors will often be applied during the procedure.  Some patients may need to have extra oxygen administered for a short period. 8. You will be asked to provide medical information, including your allergies, prior to the procedure.  We must know immediately if you are taking blood thinners (like Coumadin/Warfarin)  Or if you are allergic to IV iodine contrast (dye). We must know if you could possible be pregnant.  Possible side-effects:  Bleeding from needle site  Infection (rare, may require surgery)  Nerve injury (rare)  Numbness & tingling (temporary)  Difficulty urinating (rare, temporary)  Spinal headache ( a headache worse with upright posture)  Light -headedness (temporary)  Pain at injection site (several days)  Decreased blood pressure (temporary)  Weakness in arm/leg (temporary)  Pressure sensation in back/neck (temporary)  Call if you experience:  Fever/chills associated with headache or increased back/neck pain.  Headache worsened by an upright position.  New onset weakness or numbness of an extremity below the injection site  Hives or difficulty breathing (go to the emergency room)  Inflammation or drainage at the infection site  Severe back/neck pain  Any new symptoms which are  concerning to you  Please  note:  Although the local anesthetic injected can often make your back or neck feel good for several hours after the injection, the pain will likely return.  It takes 3-7 days for steroids to work in the epidural space.  You may not notice any pain relief for at least that one week.  If effective, we will often do a series of three injections spaced 3-6 weeks apart to maximally decrease your pain.  After the initial series, we generally will wait several months before considering a repeat injection of the same type.  If you have any questions, please call [phone] [facility]   Medications discontinued today:  Medications Discontinued During This Encounter  Medication Reason  . HYDROcodone-acetaminophen (NORCO/VICODIN) 5-325 MG per tablet Error  . ibuprofen (ADVIL,MOTRIN) 200 MG tablet Error  . oxycodone (OXY-IR) 5 MG capsule Reorder  . gabapentin (NEURONTIN) 100 MG capsule Reorder   Medications administered today:  Ms. Friley had no medications administered during this visit.  Primary Care Physician: Bobetta Lime, MD Location: [facility] Note by: Kathlen Brunswick. Dossie Arbour, M.D, DABA, DABAPM, DABPM, DABIPP, FIPP

## 2015-01-13 LAB — 25-HYDROXYVITAMIN D LCMS D2+D3
25-HYDROXY, VITAMIN D-2: 3.2 ng/mL
25-HYDROXY, VITAMIN D-3: 5 ng/mL
25-HYDROXY, VITAMIN D: 8.2 ng/mL — AB

## 2015-01-15 LAB — TOXASSURE SELECT 13 (MW), URINE: PDF: 0

## 2015-01-17 ENCOUNTER — Encounter: Payer: Self-pay | Admitting: Pain Medicine

## 2015-01-17 ENCOUNTER — Other Ambulatory Visit: Payer: Self-pay | Admitting: Pain Medicine

## 2015-01-17 DIAGNOSIS — E559 Vitamin D deficiency, unspecified: Secondary | ICD-10-CM | POA: Insufficient documentation

## 2015-01-17 MED ORDER — VITAMIN D (ERGOCALCIFEROL) 1.25 MG (50000 UNIT) PO CAPS: 50000.0000 [IU] | ORAL_CAPSULE | ORAL | Status: DC

## 2015-01-17 MED ORDER — VITAMIN D3 50 MCG (2000 UT) PO CAPS: 2000.0000 [IU] | ORAL_CAPSULE | Freq: Every day | ORAL | Status: DC

## 2015-01-17 NOTE — Progress Notes (Signed)
Quick Note:  Normal levels of Vitamin D for our Lab are between 29 and 80 ng/mL. The results of this test indicate that this patient has low levels of Vitamin D, compatible with a deficiency (<20 ng/ml), and/or insufficiency (20-30 ng/ml). Common causes include: dietary insufficiency; inadequate sun exposure; inability to absorb vitamin D from the intestines; or inability to process it due to kidney or liver disease. Associated complications may include hypocalcemia, hypophosphatemia, and reduced bone density. In addition, it is associated with fatigue, weakness, bone pain, joint pain, and muscle pain. ______

## 2015-01-17 NOTE — Progress Notes (Signed)
Quick Note:  The results of this test were unexpected. No therapeutic levels of the prescribed medication were found. Results will need to be reviewed with patient. Possible causes include: PRN use; increased intake with early depletion of medication; or opioid deveation. We will be paying attention to pill counts to assist with diferential. ______ 

## 2015-02-08 ENCOUNTER — Other Ambulatory Visit: Payer: Self-pay | Admitting: Pain Medicine

## 2015-04-04 ENCOUNTER — Encounter: Payer: Self-pay | Admitting: Pain Medicine

## 2015-04-04 ENCOUNTER — Ambulatory Visit: Payer: Self-pay | Attending: Pain Medicine | Admitting: Pain Medicine

## 2015-04-04 ENCOUNTER — Other Ambulatory Visit: Payer: Self-pay | Admitting: Pain Medicine

## 2015-04-04 VITALS — BP 111/77 | HR 83 | Temp 98.3°F | Resp 18 | Ht 66.5 in | Wt 175.0 lb

## 2015-04-04 DIAGNOSIS — M47896 Other spondylosis, lumbar region: Secondary | ICD-10-CM

## 2015-04-04 DIAGNOSIS — E781 Pure hyperglyceridemia: Secondary | ICD-10-CM | POA: Insufficient documentation

## 2015-04-04 DIAGNOSIS — M47816 Spondylosis without myelopathy or radiculopathy, lumbar region: Secondary | ICD-10-CM

## 2015-04-04 DIAGNOSIS — M489 Spondylopathy, unspecified: Secondary | ICD-10-CM

## 2015-04-04 DIAGNOSIS — M4806 Spinal stenosis, lumbar region: Secondary | ICD-10-CM | POA: Insufficient documentation

## 2015-04-04 DIAGNOSIS — G8929 Other chronic pain: Secondary | ICD-10-CM

## 2015-04-04 DIAGNOSIS — M4316 Spondylolisthesis, lumbar region: Secondary | ICD-10-CM | POA: Insufficient documentation

## 2015-04-04 DIAGNOSIS — F1190 Opioid use, unspecified, uncomplicated: Secondary | ICD-10-CM | POA: Insufficient documentation

## 2015-04-04 DIAGNOSIS — M5416 Radiculopathy, lumbar region: Secondary | ICD-10-CM | POA: Insufficient documentation

## 2015-04-04 DIAGNOSIS — Z5181 Encounter for therapeutic drug level monitoring: Secondary | ICD-10-CM

## 2015-04-04 DIAGNOSIS — E785 Hyperlipidemia, unspecified: Secondary | ICD-10-CM | POA: Insufficient documentation

## 2015-04-04 DIAGNOSIS — F329 Major depressive disorder, single episode, unspecified: Secondary | ICD-10-CM | POA: Insufficient documentation

## 2015-04-04 DIAGNOSIS — F411 Generalized anxiety disorder: Secondary | ICD-10-CM | POA: Insufficient documentation

## 2015-04-04 DIAGNOSIS — Z79891 Long term (current) use of opiate analgesic: Secondary | ICD-10-CM

## 2015-04-04 DIAGNOSIS — Z87440 Personal history of urinary (tract) infections: Secondary | ICD-10-CM | POA: Insufficient documentation

## 2015-04-04 DIAGNOSIS — G7111 Myotonic muscular dystrophy: Secondary | ICD-10-CM | POA: Insufficient documentation

## 2015-04-04 DIAGNOSIS — M4310 Spondylolisthesis, site unspecified: Secondary | ICD-10-CM

## 2015-04-04 DIAGNOSIS — E538 Deficiency of other specified B group vitamins: Secondary | ICD-10-CM | POA: Insufficient documentation

## 2015-04-04 DIAGNOSIS — M545 Low back pain: Secondary | ICD-10-CM | POA: Insufficient documentation

## 2015-04-04 DIAGNOSIS — E559 Vitamin D deficiency, unspecified: Secondary | ICD-10-CM

## 2015-04-04 MED ORDER — OXYCODONE HCL 5 MG PO CAPS: 5.0000 mg | ORAL_CAPSULE | Freq: Four times a day (QID) | ORAL | Status: DC | PRN

## 2015-04-04 MED ORDER — GABAPENTIN 100 MG PO CAPS: 300.0000 mg | ORAL_CAPSULE | Freq: Every day | ORAL | Status: DC

## 2015-04-04 NOTE — Assessment & Plan Note (Signed)
According to the patient's last MRI she has facet hypertrophy at several levels, but the grade 1 retrolisthesis of L5 over S1 is likely to be put in the muscles strain on those facet joints.

## 2015-04-04 NOTE — Assessment & Plan Note (Signed)
Date of birth release TCC is likely to be contributing to the facet pain at the L5-S1 level, as well as the radicular symptoms at the level of the L5 nerve root secondary to the foraminal stenosis caused by the retrolisthesis. This pathology can also put pressure on the L5-S1 disc causing discogenic pain in the center of the lower back.

## 2015-04-04 NOTE — Progress Notes (Signed)
Patient's Name: Sheryl Barker MRN: [id number] DOB: 02-07-1976 DOS: 04/04/2015  Primary Reason(s) for Visit: Encounter for Medication Management CC: Back Pain; Shoulder Pain; and Hand Pain   HPI  Sheryl Barker is a 40 y.o. year old, female patient, who returns today as an established patient. 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[place]); [place]; Chest pain; Long term current use of opiate analgesic; Long term prescription opiate use; Opiate use; Opiate dependence ([place]); Encounter for therapeutic drug level monitoring; Chronic low back pain  (Location of Primary Source of Pain) (Bilateral) (L>R); Lumbar spondylosis (L>R); Chronic lower extremity pain (Location of Secondary source of pain) (Left); Chronic lumbar radicular pain (left side) (L5 Dermatome); Myotonic dystrophy, 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 Her primarily concern today is the Back Pain; Shoulder Pain; and Hand Pain   The patient returns to the clinics today for pharmacological management of her chronic pain. She indicates that the primary pain is in the lower back followed by the leg pain. The low back pain is described to be bilateral but with the left side being worst on the right. The leg pain is described to be bilateral, but the left side is worse than it goes all the way to the top of the foot in what seems to be an L5 dermatomal distribution. On the right leg goes down to the knee over the area of the thigh,  anteriorly. She recently broke her ankle around November and she has had a little bit more pain than usual because of this. She comes in today indicating that her oxycodone is working to relieve her pain but not quite as much as it used to. This triggered for [place] to talk about her tolerance and how to deal with it through "drug holidays". The patient was provided with some information with regards to that today.  She indicates that the oxycodone is lasting only 2 hours. Today we will add 1 more pill per day. In addition, I have offered her to do lumbar epidural steroid injections for her leg pain and lumbar facet blocks for her low back pain. Today I have explained to her what both toes are and I have scheduled them as PRN procedures.  Reported Pain Score: 8 , clinically she looks like a 2/10. Reported level is inconsistent with clinical obrservations. Pain Type: Chronic pain Pain Location: Back Pain Orientation: Left Pain Descriptors / Indicators: Constant, Aching, Sharp, Radiating, Discomfort Pain Frequency: Constant  Date of Last Visit: 01/14/15 Service Provided on Last Visit: Med Refill  Pharmacotherapy  Medication(s): She is currently using oxycodone 5 mg 1 tablet every 8 hours when necessary for pain. Onset of action: Within expected pharmacological parameters Time to Peak effect: Timing and results are as within normal expected parameters Analgesic Effect: More than 50% Activity Facilitation: Medication(s) allow patient to sit, stand, walk, and do the basic ADLs Perceived Effectiveness: Described as relatively effective, allowing for increase in activities of daily living (ADL) Side-effects or Adverse reactions: None reported Duration of action: She indicates that it is only lasting about 2 hours. [place] PMP: Compliant with practice rules and regulations UDS Results: Last  UDS done on 01/07/2015 came back within normal limits for this patient and with no unexpected results. She remains  compliant. UDS Interpretation: Patient appears to be compliant with practice rules and regulations Medication Assessment Form: Reviewed. Patient indicates being compliant with therapy Treatment compliance: Compliant Substance Use Disorder (SUD) Risk Level: Low Pharmacologic Plan: Continue therapy as is  Lab Work: Illicit Drugs No results found for: THCU, COCAINSCRNUR, PCPSCRNUR, MDMA, AMPHETMU, METHADONE, ETOH  Inflammation Markers Lab Results  Component Value Date   ESRSEDRATE 12 01/07/2015   CRP 0.7 01/07/2015    Renal Function Lab Results  Component Value Date   BUN 10 01/07/2015   CREATININE 0.67 01/07/2015   GFRAA >60 01/07/2015   GFRNONAA >60 01/07/2015    Hepatic Function Lab Results  Component Value Date   AST 27 01/07/2015   ALT 35 01/07/2015   ALBUMIN 3.7 01/07/2015    Electrolytes Lab Results  Component Value Date   NA 139 01/07/2015   K 3.7 01/07/2015   CL 108 01/07/2015   CALCIUM 9.4 01/07/2015   MG 2.0 01/07/2015    Allergies  Sheryl Barker is allergic to ultram.  Meds  The patient has a current medication list which includes the following prescription(s): duloxetine, fenofibrate, gabapentin, mexiletine, oxycodone, oxycodone, oxycodone, and vitamin d (ergocalciferol).  Current Outpatient Prescriptions on File Prior to Visit  Medication Sig  . DULoxetine (CYMBALTA) 20 MG capsule Take 20 mg by mouth 2 (two) times daily.  . fenofibrate (TRICOR) 145 MG tablet Take 145 mg by mouth daily.  Marland Kitchen mexiletine (MEXITIL) 150 MG capsule Take 150 mg by mouth 3 (three) times daily.  . Vitamin D, Ergocalciferol, (DRISDOL) 50000 UNITS CAPS capsule Take 1 capsule (50,000 Units total) by mouth 2 (two) times a week. X 6 weeks.   No current facility-administered medications on file prior to visit.    ROS  Constitutional: Afebrile, no chills, well hydrated and well nourished Gastrointestinal: negative Musculoskeletal:negative Neurological: negative Behavioral/Psych:  negative  PFSH  Medical:  Sheryl Barker  has a past medical history of Muscular dystrophy ([place]); MD (muscular dystrophy) ([place]); Hypercholesteremia; IBS (irritable bowel syndrome); Depression; Anxiety; Chest pain (05/04/2014); Broken ankle; and Vitamin D insufficiency (01/07/2015). Family: family history includes Hypertension in her father; Muscular dystrophy in her mother; Stroke in her mother. Surgical:  has past surgical history that includes Tubal ligation. Tobacco:  reports that she has never smoked. She does not have any smokeless tobacco history on file. Alcohol:  reports that she does not drink alcohol. Drug:  reports that she does not use illicit drugs.  Physical Exam  Vitals:  Today's Vitals   04/04/15 1138 04/04/15 1148  BP: 111/77   Pulse: 83   Temp: 98.3 F (36.8 C)   TempSrc: Oral   Resp: 18   Height: 5' 6.5" (1.689 m)   Weight: 175 lb (79.379 kg)   SpO2: 96%   PainSc:  8     Calculated BMI: Body mass index is 27.83 kg/(m^2).  General appearance: alert, cooperative, appears stated age, no distress and mildly obese Eyes: PERLA Respiratory: No evidence respiratory distress, no audible rales or ronchi and no use of accessory muscles of respiration  Cervical Spine Inspection: Normal anatomy Alignment: Symetrical ROM: Adequate  Upper Extremities Inspection: No gross anomalies detected ROM: Adequate Sensory: Normal Motor: Unremarkable  Thoracic Spine Inspection: No gross anomalies detected Alignment: Symetrical ROM: Adequate Palpation: WNL  Lumbar Spine Inspection: No gross anomalies detected Alignment: Symetrical ROM: Decreased Palpation: WNL Provocative Tests:  Lumbar Hyperextension and rotation test:  Positive bilaterally Patrick's Maneuver: Negative Gait: Antalgic (limping). She currently is using a cane to ambulate.  Lower Extremities Inspection: No gross anomalies detected ROM: Adequate Sensory:  Normal Motor: Guarding  Toe walk (S1): WNL  Heal  walk (L5): Difficult on the left side.  Assessment & Plan  Primary Diagnosis & Pertinent Problem List: The primary encounter diagnosis was Chronic pain. Diagnoses of Chronic low back pain (L>R), Vitamin D deficiency, Long term current use of opiate analgesic, Encounter for therapeutic drug level monitoring, Chronic lumbar radicular pain (left side) (L5 Dermatome), Lumbar facet hypertrophy (Bilateral) (L>R), and Grade 1 Retrolisthesis of L5 over S1 were also pertinent to this visit.  Visit Diagnosis: 1. Chronic pain   2. Chronic low back pain (L>R)   3. Vitamin D deficiency   4. Long term current use of opiate analgesic   5. Encounter for therapeutic drug level monitoring   6. Chronic lumbar radicular pain (left side) (L5 Dermatome)   7. Lumbar facet hypertrophy (Bilateral) (L>R)   8. Grade 1 Retrolisthesis of L5 over S1     Assessment: Grade 1 Retrolisthesis of L5 over S1 Date of birth release TCC is likely to be contributing to the facet pain at the L5-S1 level, as well as the radicular symptoms at the level of the L5 nerve root secondary to the foraminal stenosis caused by the retrolisthesis. This pathology can also put pressure on the L5-S1 disc causing discogenic pain in the center of the lower back.  Lumbar facet hypertrophy (Bilateral) (L>R) According to the patient's last MRI she has facet hypertrophy at several levels, but the grade 1 retrolisthesis of L5 over S1 is likely to be put in the muscles strain on those facet joints.    Plan of Care  Pharmacotherapy (Medications Ordered): Meds ordered this encounter  Medications  . oxycodone (OXY-IR) 5 MG capsule    Sig: Take 1 capsule (5 mg total) by mouth every 6 (six) hours as needed for pain.    Dispense:  120 capsule    Refill:  0    Do not place this medication, or any other prescription from our practice, on "Automatic Refill". Patient may have prescription filled one day early if pharmacy is closed on scheduled refill  date. Do not fill until: 04/07/15 To last until: 05/07/15  . oxycodone (OXY-IR) 5 MG capsule    Sig: Take 1 capsule (5 mg total) by mouth every 6 (six) hours as needed for pain.    Dispense:  120 capsule    Refill:  0    Do not place this medication, or any other prescription from our practice, on "Automatic Refill". Patient may have prescription filled one day early if pharmacy is closed on scheduled refill date. Do not fill until: 05/07/15 To last until: 06/06/15  . DISCONTD: oxycodone (OXY-IR) 5 MG capsule    Sig: Take 1 capsule (5 mg total) by mouth every 6 (six) hours as needed for pain.    Dispense:  90 capsule    Refill:  0    Do not place this medication, or any other prescription from our practice, on "Automatic Refill". Patient may have prescription filled one day early if pharmacy is closed on scheduled refill date. Do not fill until: 06/06/15 To last until: 07/05/15  . gabapentin (NEURONTIN) 100 MG capsule    Sig: Take 3 capsules (300 mg total) by mouth at bedtime.    Dispense:  90 capsule  Refill:  2    Do not place this medication, or any other prescription from our practice, on "Automatic Refill". Patient may have prescription filled one day early if pharmacy is closed on scheduled refill date.  Marland Kitchen oxycodone (OXY-IR) 5 MG capsule    Sig: Take 1 capsule (5 mg total) by mouth every 6 (six) hours as needed for pain.    Dispense:  120 capsule    Refill:  0    Do not place this medication, or any other prescription from our practice, on "Automatic Refill". Patient may have prescription filled one day early if pharmacy is closed on scheduled refill date. Do not fill until: 06/06/15 To last until: 07/05/15    [facility] & Procedure Ordered: Orders Placed This Encounter  Procedures  . LUMBAR EPIDURAL STEROID INJECTION    Standing Status: Standing     Number of Occurrences: 1     Standing Expiration Date: 04/03/2016    Scheduling Instructions:     Side: Left-sided (L5-S1)      Sedation: No Sedation.     Timeframe: PRN Procedure. Patient will call to schedule.    Order Specific Question:  Where will this procedure be performed?    Answer:  [facility]  . LUMBAR FACET(MEDIAL BRANCH NERVE [place]) MBNB    Standing Status: Standing     Number of Occurrences: 1     Standing Expiration Date: 04/03/2016    Scheduling Instructions:     Side: Bilateral (diagnostic)     Level: L2, L3, L4, L5, & S1 Medial Branch Nerve     Sedation: With Sedation.     Timeframe: PRN Procedure. Patient will call to schedule.    Order Specific Question:  Where will this procedure be performed?    Answer:  [facility]  . Drugs of abuse screen w/o alc, rtn urine-sln    Volume: 10 ml(s). Minimum 3 ml of urine is needed. Document temperature of fresh sample. Indications: Long term (current) use of opiate analgesic (Z79.891) Test#: [id number] (Tox[employer] Select-13)    Imaging Ordered: None  Interventional Therapies: Scheduled: None at this time. PRN Procedures:  1. For the lower extremity pain, L5-S1 lumbar epidural steroid injection under fluoroscopic guidance, no sedation.  2. For the low back pain, diagnostic bilateral lumbar facet H[place] under fluoroscopic guidance and IV sedation.    Referral(s) or Consult(s): None at this time.  Medications administered during this visit: Sheryl Barker had no medications administered during this visit.  Future Appointments Date Time Provider [place]  06/29/2015 10:40 AM Milinda Pointer, MD [facility] None    Primary Care Physician: Bobetta Lime, MD Location: [facility] Note by: Kathlen Brunswick. Dossie Arbour, M.D, DABA, DABAPM, DABPM, DABIPP, FIPP

## 2015-04-04 NOTE — Patient Instructions (Signed)
GENERAL RISKS AND COMPLICATIONS  What are the risk, side effects and possible complications? Generally speaking, most procedures are safe.  However, with any procedure there are risks, side effects, and the possibility of complications.  The risks and complications are dependent upon the sites that are lesioned, or the type of nerve Khachatryan to be performed.  The closer the procedure is to the spine, the more serious the risks are.  Great care is taken when placing the radio frequency needles, Boom needles or lesioning probes, but sometimes complications can occur. 1. Infection: Any time there is an injection through the skin, there is a risk of infection.  This is why sterile conditions are used for these blocks.  There are four possible types of infection. 1. Localized skin infection. 2. Central Nervous System Infection-This can be in the form of Meningitis, which can be deadly. 3. Epidural Infections-This can be in the form of an epidural abscess, which can cause pressure inside of the spine, causing compression of the spinal cord with subsequent paralysis. This would require an emergency surgery to decompress, and there are no guarantees that the patient would recover from the paralysis. 4. Discitis-This is an infection of the intervertebral discs.  It occurs in about 1% of discography procedures.  It is difficult to treat and it may lead to surgery.        2. Pain: the needles have to go through skin and soft tissues, will cause soreness.       3. Damage to internal structures:  The nerves to be lesioned may be near blood vessels or    other nerves which can be potentially damaged.       4. Bleeding: Bleeding is more common if the patient is taking blood thinners such as  aspirin, Coumadin, Ticiid, Plavix, etc., or if he/she have some genetic predisposition  such as hemophilia. Bleeding into the spinal canal can cause compression of the spinal  cord with subsequent paralysis.  This would require an  emergency surgery to  decompress and there are no guarantees that the patient would recover from the  paralysis.       5. Pneumothorax:  Puncturing of a lung is a possibility, every time a needle is introduced in  the area of the chest or upper back.  Pneumothorax refers to free air around the  collapsed lung(s), inside of the thoracic cavity (chest cavity).  Another two possible  complications related to a similar event would include: Hemothorax and Chylothorax.   These are variations of the Pneumothorax, where instead of air around the collapsed  lung(s), you may have blood or chyle, respectively.       6. Spinal headaches: They may occur with any procedures in the area of the spine.       7. Persistent CSF (Cerebro-Spinal Fluid) leakage: This is a rare problem, but may occur  with prolonged intrathecal or epidural catheters either due to the formation of a fistulous  track or a dural tear.       8. Nerve damage: By working so close to the spinal cord, there is always a possibility of  nerve damage, which could be as serious as a permanent spinal cord injury with  paralysis.       9. Death:  Although rare, severe deadly allergic reactions known as "Anaphylactic  reaction" can occur to any of the medications used.      10. Worsening of the symptoms:  We can always make thing worse.    What are the chances of something like this happening? Chances of any of this occuring are extremely low.  By statistics, you have more of a chance of getting killed in a motor vehicle accident: while driving to the hospital than any of the above occurring .  Nevertheless, you should be aware that they are possibilities.  In general, it is similar to taking a shower.  Everybody knows that you can slip, hit your head and get killed.  Does that mean that you should not shower again?  Nevertheless always keep in mind that statistics do not mean anything if you happen to be on the wrong side of them.  Even if a procedure has a 1  (one) in a 1,000,000 (million) chance of going wrong, it you happen to be that one..Also, keep in mind that by statistics, you have more of a chance of having something go wrong when taking medications.  Who should not have this procedure? If you are on a blood thinning medication (e.g. Coumadin, Plavix, see list of "Blood Thinners"), or if you have an active infection going on, you should not have the procedure.  If you are taking any blood thinners, please inform your physician.  How should I prepare for this procedure?  Do not eat or drink anything at least six hours prior to the procedure.  Bring a driver with you .  It cannot be a taxi.  Come accompanied by an adult that can drive you back, and that is strong enough to help you if your legs get weak or numb from the local anesthetic.  Take all of your medicines the morning of the procedure with just enough water to swallow them.  If you have diabetes, make sure that you are scheduled to have your procedure done first thing in the morning, whenever possible.  If you have diabetes, take only half of your insulin dose and notify our nurse that you have done so as soon as you arrive at the clinic.  If you are diabetic, but only take blood sugar pills (oral hypoglycemic), then do not take them on the morning of your procedure.  You may take them after you have had the procedure.  Do not take aspirin or any aspirin-containing medications, at least eleven (11) days prior to the procedure.  They may prolong bleeding.  Wear loose fitting clothing that may be easy to take off and that you would not mind if it got stained with Betadine or blood.  Do not wear any jewelry or perfume  Remove any nail coloring.  It will interfere with some of our monitoring equipment.  NOTE: Remember that this is not meant to be interpreted as a complete list of all possible complications.  Unforeseen problems may occur.  BLOOD THINNERS The following drugs  contain aspirin or other products, which can cause increased bleeding during surgery and should not be taken for 2 weeks prior to and 1 week after surgery.  If you should need take something for relief of minor pain, you may take acetaminophen which is found in Tylenol,m Datril, Anacin-3 and Panadol. It is not blood thinner. The products listed below are.  Do not take any of the products listed below in addition to any listed on your instruction sheet.  A.P.C or A.P.C with Codeine Codeine Phosphate Capsules #3 Ibuprofen Ridaura  ABC compound Congesprin Imuran rimadil  Advil Cope Indocin Robaxisal  Alka-Seltzer Effervescent Pain Reliever and Antacid Coricidin or Coricidin-D  Indomethacin Rufen    Alka-Seltzer plus Cold Medicine Cosprin Ketoprofen S-A-C Tablets  Anacin Analgesic Tablets or Capsules Coumadin Korlgesic Salflex  Anacin Extra Strength Analgesic tablets or capsules CP-2 Tablets Lanoril Salicylate  Anaprox Cuprimine Capsules Levenox Salocol  Anexsia-D Dalteparin Magan Salsalate  Anodynos Darvon compound Magnesium Salicylate Sine-off  Ansaid Dasin Capsules Magsal Sodium Salicylate  Anturane Depen Capsules Marnal Soma  APF Arthritis pain formula Dewitt's Pills Measurin Stanback  Argesic Dia-Gesic Meclofenamic Sulfinpyrazone  Arthritis Bayer Timed Release Aspirin Diclofenac Meclomen Sulindac  Arthritis pain formula Anacin Dicumarol Medipren Supac  Analgesic (Safety coated) Arthralgen Diffunasal Mefanamic Suprofen  Arthritis Strength Bufferin Dihydrocodeine Mepro Compound Suprol  Arthropan liquid Dopirydamole Methcarbomol with Aspirin Synalgos  ASA tablets/Enseals Disalcid Micrainin Tagament  Ascriptin Doan's Midol Talwin  Ascriptin A/D Dolene Mobidin Tanderil  Ascriptin Extra Strength Dolobid Moblgesic Ticlid  Ascriptin with Codeine Doloprin or Doloprin with Codeine Momentum Tolectin  Asperbuf Duoprin Mono-gesic Trendar  Aspergum Duradyne Motrin or Motrin IB Triminicin  Aspirin  plain, buffered or enteric coated Durasal Myochrisine Trigesic  Aspirin Suppositories Easprin Nalfon Trillsate  Aspirin with Codeine Ecotrin Regular or Extra Strength Naprosyn Uracel  Atromid-S Efficin Naproxen Ursinus  Auranofin Capsules Elmiron Neocylate Vanquish  Axotal Emagrin Norgesic Verin  Azathioprine Empirin or Empirin with Codeine Normiflo Vitamin E  Azolid Emprazil Nuprin Voltaren  Bayer Aspirin plain, buffered or children's or timed BC Tablets or powders Encaprin Orgaran Warfarin Sodium  Buff-a-Comp Enoxaparin Orudis Zorpin  Buff-a-Comp with Codeine Equegesic Os-Cal-Gesic   Buffaprin Excedrin plain, buffered or Extra Strength Oxalid   Bufferin Arthritis Strength Feldene Oxphenbutazone   Bufferin plain or Extra Strength Feldene Capsules Oxycodone with Aspirin   Bufferin with Codeine Fenoprofen Fenoprofen Pabalate or Pabalate-SF   Buffets II Flogesic Panagesic   Buffinol plain or Extra Strength Florinal or Florinal with Codeine Panwarfarin   Buf-Tabs Flurbiprofen Penicillamine   Butalbital Compound Four-way cold tablets Penicillin   Butazolidin Fragmin Pepto-Bismol   Carbenicillin Geminisyn Percodan   Carna Arthritis Reliever Geopen Persantine   Carprofen Gold's salt Persistin   Chloramphenicol Goody's Phenylbutazone   Chloromycetin Haltrain Piroxlcam   Clmetidine heparin Plaquenil   Cllnoril Hyco-pap Ponstel   Clofibrate Hydroxy chloroquine Propoxyphen         Before stopping any of these medications, be sure to consult the physician who ordered them.  Some, such as Coumadin (Warfarin) are ordered to prevent or treat serious conditions such as "deep thrombosis", "pumonary embolisms", and other heart problems.  The amount of time that you may need off of the medication may also vary with the medication and the reason for which you were taking it.  If you are taking any of these medications, please make sure you notify your pain physician before you undergo any  procedures.         Facet Blocks Patient Information  Description: The facets are joints in the spine between the vertebrae.  Like any joints in the body, facets can become irritated and painful.  Arthritis can also effect the facets.  By injecting steroids and local anesthetic in and around these joints, we can temporarily Gehlhausen the nerve supply to them.  Steroids act directly on irritated nerves and tissues to reduce selling and inflammation which often leads to decreased pain.  Facet blocks may be done anywhere along the spine from the neck to the low back depending upon the location of your pain.   After numbing the skin with local anesthetic (like Novocaine), a small needle is passed onto the facet joints under x-ray guidance.    You may experience a sensation of pressure while this is being done.  The entire Jankovich usually lasts about 15-25 minutes.   Conditions which may be treated by facet blocks:   Low back/buttock pain  Neck/shoulder pain  Certain types of headaches  Preparation for the injection:  1. Do not eat any solid food or dairy products within 6 hours of your appointment. 2. You may drink clear liquid up to 2 hours before appointment.  Clear liquids include water, black coffee, juice or soda.  No milk or cream please. 3. You may take your regular medication, including pain medications, with a sip of water before your appointment.  Diabetics should hold regular insulin (if taken separately) and take 1/2 normal NPH dose the morning of the procedure.  Carry some sugar containing items with you to your appointment. 4. A driver must accompany you and be prepared to drive you home after your procedure. 5. Bring all your current medications with you. 6. An IV may be inserted and sedation may be given at the discretion of the physician. 7. A blood pressure cuff, EKG and other monitors will often be applied during the procedure.  Some patients may need to have extra oxygen  administered for a short period. 8. You will be asked to provide medical information, including your allergies and medications, prior to the procedure.  We must know immediately if you are taking blood thinners (like Coumadin/Warfarin) or if you are allergic to IV iodine contrast (dye).  We must know if you could possible be pregnant.  Possible side-effects:   Bleeding from needle site  Infection (rare, may require surgery)  Nerve injury (rare)  Numbness & tingling (temporary)  Difficulty urinating (rare, temporary)  Spinal headache (a headache worse with upright posture)  Light-headedness (temporary)  Pain at injection site (serveral days)  Decreased blood pressure (rare, temporary)  Weakness in arm/leg (temporary)  Pressure sensation in back/neck (temporary)   Call if you experience:   Fever/chills associated with headache or increased back/neck pain  Headache worsened by an upright position  New onset, weakness or numbness of an extremity below the injection site  Hives or difficulty breathing (go to the emergency room)  Inflammation or drainage at the injection site(s)  Severe back/neck pain greater than usual  New symptoms which are concerning to you  Please note:  Although the local anesthetic injected can often make your back or neck feel good for several hours after the injection, the pain will likely return. It takes 3-7 days for steroids to work.  You may not notice any pain relief for at least one week.  If effective, we will often do a series of 2-3 injections spaced 3-6 weeks apart to maximally decrease your pain.  After the initial series, you may be a candidate for a more permanent nerve Striplin of the facets.  If you have any questions, please call #[phone]) [facility] Patient Information  Description: The epidural space surrounds the nerves as they exit the spinal cord.  In some  patients, the nerves can be compressed and inflamed by a bulging disc or a tight spinal canal (spinal stenosis).  By injecting steroids into the epidural space, we can bring irritated nerves into direct contact with a potentially helpful medication.  These steroids act directly on the irritated nerves and can reduce swelling and inflammation which often leads to decreased pain.  Epidural steroids may be injected anywhere along the spine and  from the neck to the low back depending upon the location of your pain.   After numbing the skin with local anesthetic (like Novocaine), a small needle is passed into the epidural space slowly.  You may experience a sensation of pressure while this is being done.  The entire Basulto usually last less than 10 minutes.  Conditions which may be treated by epidural steroids:   Low back and leg pain  Neck and arm pain  Spinal stenosis  Post-laminectomy syndrome  Herpes zoster (shingles) pain  Pain from compression fractures  Preparation for the injection:  1. Do not eat any solid food or dairy products within 6 hours of your appointment.  2. You may drink clear liquids up to 2 hours before appointment.  Clear liquids include water, black coffee, juice or soda.  No milk or cream please. 3. You may take your regular medication, including pain medications, with a sip of water before your appointment  Diabetics should hold regular insulin (if taken separately) and take 1/2 normal NPH dos the morning of the procedure.  Carry some sugar containing items with you to your appointment. 4. A driver must accompany you and be prepared to drive you home after your procedure.  5. Bring all your current medications with your. 6. An IV may be inserted and sedation may be given at the discretion of the physician.   7. A blood pressure cuff, EKG and other monitors will often be applied during the procedure.  Some patients may need to have extra oxygen administered for a short  period. 8. You will be asked to provide medical information, including your allergies, prior to the procedure.  We must know immediately if you are taking blood thinners (like Coumadin/Warfarin)  Or if you are allergic to IV iodine contrast (dye). We must know if you could possible be pregnant.  Possible side-effects:  Bleeding from needle site  Infection (rare, may require surgery)  Nerve injury (rare)  Numbness & tingling (temporary)  Difficulty urinating (rare, temporary)  Spinal headache ( a headache worse with upright posture)  Light -headedness (temporary)  Pain at injection site (several days)  Decreased blood pressure (temporary)  Weakness in arm/leg (temporary)  Pressure sensation in back/neck (temporary)  Call if you experience:  Fever/chills associated with headache or increased back/neck pain.  Headache worsened by an upright position.  New onset weakness or numbness of an extremity below the injection site  Hives or difficulty breathing (go to the emergency room)  Inflammation or drainage at the infection site  Severe back/neck pain  Any new symptoms which are concerning to you  Please note:  Although the local anesthetic injected can often make your back or neck feel good for several hours after the injection, the pain will likely return.  It takes 3-7 days for steroids to work in the epidural space.  You may not notice any pain relief for at least that one week.  If effective, we will often do a series of three injections spaced 3-6 weeks apart to maximally decrease your pain.  After the initial series, we generally will wait several months before considering a repeat injection of the same type.  If you have any questions, please call [phone] [facility]

## 2015-04-04 NOTE — Progress Notes (Signed)
Safety precautions to be maintained throughout the outpatient stay will include: orient to surroundings, keep bed in low position, maintain call bell within reach at all times, provide assistance with transfer out of bed and ambulation.  Pt here for med refill- Would like to talk to you about increase related to increase pain- not lasting as long  Broke right ankle since last visit Pills remaining 9/90 Oxycodone 5mg  filled 03-08-15

## 2015-04-05 ENCOUNTER — Other Ambulatory Visit: Payer: Self-pay

## 2015-04-09 LAB — TOXASSURE SELECT 13 (MW), URINE: PDF: 0

## 2015-04-16 NOTE — Progress Notes (Signed)
Quick Note:  The findings of this UDT were reported as abnormal due to inconsistencies with expected results. An expected substance was not present in the sample. Expectations were based on the medication history provided by the patient at the time of sample collection. These results are concerning due to the possibility of opioid diversion, or non-compliance with instructions on how to take the medication, including increase intake of medication early in the prescription refill, possibly running out towards the end of the prescribed period. ______ 

## 2015-06-29 ENCOUNTER — Ambulatory Visit: Payer: Self-pay | Attending: Pain Medicine | Admitting: Pain Medicine

## 2015-06-29 ENCOUNTER — Encounter: Payer: Self-pay | Admitting: Pain Medicine

## 2015-06-29 VITALS — BP 99/67 | HR 66 | Temp 98.3°F | Resp 16 | Ht 66.0 in | Wt 175.0 lb

## 2015-06-29 DIAGNOSIS — F419 Anxiety disorder, unspecified: Secondary | ICD-10-CM | POA: Insufficient documentation

## 2015-06-29 DIAGNOSIS — E669 Obesity, unspecified: Secondary | ICD-10-CM | POA: Insufficient documentation

## 2015-06-29 DIAGNOSIS — G710 Muscular dystrophy: Secondary | ICD-10-CM | POA: Insufficient documentation

## 2015-06-29 DIAGNOSIS — D219 Benign neoplasm of connective and other soft tissue, unspecified: Secondary | ICD-10-CM | POA: Insufficient documentation

## 2015-06-29 DIAGNOSIS — M792 Neuralgia and neuritis, unspecified: Secondary | ICD-10-CM

## 2015-06-29 DIAGNOSIS — E7800 Pure hypercholesterolemia, unspecified: Secondary | ICD-10-CM | POA: Insufficient documentation

## 2015-06-29 DIAGNOSIS — E538 Deficiency of other specified B group vitamins: Secondary | ICD-10-CM | POA: Insufficient documentation

## 2015-06-29 DIAGNOSIS — F1190 Opioid use, unspecified, uncomplicated: Secondary | ICD-10-CM

## 2015-06-29 DIAGNOSIS — Z79891 Long term (current) use of opiate analgesic: Secondary | ICD-10-CM

## 2015-06-29 DIAGNOSIS — M4316 Spondylolisthesis, lumbar region: Secondary | ICD-10-CM | POA: Insufficient documentation

## 2015-06-29 DIAGNOSIS — K589 Irritable bowel syndrome without diarrhea: Secondary | ICD-10-CM | POA: Insufficient documentation

## 2015-06-29 DIAGNOSIS — Z5181 Encounter for therapeutic drug level monitoring: Secondary | ICD-10-CM

## 2015-06-29 DIAGNOSIS — M545 Low back pain: Secondary | ICD-10-CM | POA: Insufficient documentation

## 2015-06-29 DIAGNOSIS — E559 Vitamin D deficiency, unspecified: Secondary | ICD-10-CM

## 2015-06-29 DIAGNOSIS — F329 Major depressive disorder, single episode, unspecified: Secondary | ICD-10-CM | POA: Insufficient documentation

## 2015-06-29 DIAGNOSIS — G8929 Other chronic pain: Secondary | ICD-10-CM

## 2015-06-29 DIAGNOSIS — G7111 Myotonic muscular dystrophy: Secondary | ICD-10-CM | POA: Insufficient documentation

## 2015-06-29 DIAGNOSIS — E785 Hyperlipidemia, unspecified: Secondary | ICD-10-CM | POA: Insufficient documentation

## 2015-06-29 MED ORDER — OXYCODONE HCL 5 MG PO TABS: 5.0000 mg | ORAL_TABLET | Freq: Four times a day (QID) | ORAL | Status: DC | PRN

## 2015-06-29 MED ORDER — GABAPENTIN 100 MG PO CAPS: 100.0000 mg | ORAL_CAPSULE | Freq: Four times a day (QID) | ORAL | Status: DC

## 2015-06-29 NOTE — Patient Instructions (Signed)
Facet Blocks Patient Information  Description: The facets are joints in the spine between the vertebrae.  Like any joints in the body, facets can become irritated and painful.  Arthritis can also effect the facets.  By injecting steroids and local anesthetic in and around these joints, we can temporarily Bink the nerve supply to them.  Steroids act directly on irritated nerves and tissues to reduce selling and inflammation which often leads to decreased pain.  Facet blocks may be done anywhere along the spine from the neck to the low back depending upon the location of your pain.   After numbing the skin with local anesthetic (like Novocaine), a small needle is passed onto the facet joints under x-ray guidance.  You may experience a sensation of pressure while this is being done.  The entire Diosdado usually lasts about 15-25 minutes.   Conditions which may be treated by facet blocks:   Low back/buttock pain  Neck/shoulder pain  Certain types of headaches  Preparation for the injection:  1. Do not eat any solid food or dairy products within 8 hours of your appointment. 2. You may drink clear liquid up to 3 hours before appointment.  Clear liquids include water, black coffee, juice or soda.  No milk or cream please. 3. You may take your regular medication, including pain medications, with a sip of water before your appointment.  Diabetics should hold regular insulin (if taken separately) and take 1/2 normal NPH dose the morning of the procedure.  Carry some sugar containing items with you to your appointment. 4. A driver must accompany you and be prepared to drive you home after your procedure. 5. Bring all your current medications with you. 6. An IV may be inserted and sedation may be given at the discretion of the physician. 7. A blood pressure cuff, EKG and other monitors will often be applied during the procedure.  Some patients may need to have extra oxygen administered for a short  period. 8. You will be asked to provide medical information, including your allergies and medications, prior to the procedure.  We must know immediately if you are taking blood thinners (like Coumadin/Warfarin) or if you are allergic to IV iodine contrast (dye).  We must know if you could possible be pregnant.  Possible side-effects:   Bleeding from needle site  Infection (rare, may require surgery)  Nerve injury (rare)  Numbness & tingling (temporary)  Difficulty urinating (rare, temporary)  Spinal headache (a headache worse with upright posture)  Light-headedness (temporary)  Pain at injection site (serveral days)  Decreased blood pressure (rare, temporary)  Weakness in arm/leg (temporary)  Pressure sensation in back/neck (temporary)   Call if you experience:   Fever/chills associated with headache or increased back/neck pain  Headache worsened by an upright position  New onset, weakness or numbness of an extremity below the injection site  Hives or difficulty breathing (go to the emergency room)  Inflammation or drainage at the injection site(s)  Severe back/neck pain greater than usual  New symptoms which are concerning to you  Please note:  Although the local anesthetic injected can often make your back or neck feel good for several hours after the injection, the pain will likely return. It takes 3-7 days for steroids to work.  You may not notice any pain relief for at least one week.  If effective, we will often do a series of 2-3 injections spaced 3-6 weeks apart to maximally decrease your pain.  After the initial   series, you may be a candidate for a more permanent nerve Shutes of the facets.  If you have any questions, please call [phone] [facility]  Epidural Steroid Injection Patient Information  Description: The epidural space surrounds the nerves as they exit the spinal cord.  In some patients, the nerves can  be compressed and inflamed by a bulging disc or a tight spinal canal (spinal stenosis).  By injecting steroids into the epidural space, we can bring irritated nerves into direct contact with a potentially helpful medication.  These steroids act directly on the irritated nerves and can reduce swelling and inflammation which often leads to decreased pain.  Epidural steroids may be injected anywhere along the spine and from the neck to the low back depending upon the location of your pain.   After numbing the skin with local anesthetic (like Novocaine), a small needle is passed into the epidural space slowly.  You may experience a sensation of pressure while this is being done.  The entire Rudin usually last less than 10 minutes.  Conditions which may be treated by epidural steroids:   Low back and leg pain  Neck and arm pain  Spinal stenosis  Post-laminectomy syndrome  Herpes zoster (shingles) pain  Pain from compression fractures  Preparation for the injection:  1. Do not eat any solid food or dairy products within 8 hours of your appointment.  2. You may drink clear liquids up to 3 hours before appointment.  Clear liquids include water, black coffee, juice or soda.  No milk or cream please. 3. You may take your regular medication, including pain medications, with a sip of water before your appointment  Diabetics should hold regular insulin (if taken separately) and take 1/2 normal NPH dos the morning of the procedure.  Carry some sugar containing items with you to your appointment. 4. A driver must accompany you and be prepared to drive you home after your procedure.  5. Bring all your current medications with your. 6. An IV may be inserted and sedation may be given at the discretion of the physician.   7. A blood pressure cuff, EKG and other monitors will often be applied during the procedure.  Some patients may need to have extra oxygen administered for a short period. 8. You will be  asked to provide medical information, including your allergies, prior to the procedure.  We must know immediately if you are taking blood thinners (like Coumadin/Warfarin)  Or if you are allergic to IV iodine contrast (dye). We must know if you could possible be pregnant.  Possible side-effects:  Bleeding from needle site  Infection (rare, may require surgery)  Nerve injury (rare)  Numbness & tingling (temporary)  Difficulty urinating (rare, temporary)  Spinal headache ( a headache worse with upright posture)  Light -headedness (temporary)  Pain at injection site (several days)  Decreased blood pressure (temporary)  Weakness in arm/leg (temporary)  Pressure sensation in back/neck (temporary)  Call if you experience:  Fever/chills associated with headache or increased back/neck pain.  Headache worsened by an upright position.  New onset weakness or numbness of an extremity below the injection site  Hives or difficulty breathing (go to the emergency room)  Inflammation or drainage at the infection site  Severe back/neck pain  Any new symptoms which are concerning to you  Please note:  Although the local anesthetic injected can often make your back or neck feel good for several hours after the injection,   the pain will likely return.  It takes 3-7 days for steroids to work in the epidural space.  You may not notice any pain relief for at least that one week.  If effective, we will often do a series of three injections spaced 3-6 weeks apart to maximally decrease your pain.  After the initial series, we generally will wait several months before considering a repeat injection of the same type.  If you have any questions, please call [phone] [facility] 

## 2015-06-29 NOTE — Progress Notes (Signed)
Safety precautions to be maintained throughout the outpatient stay will include: orient to surroundings, keep bed in low position, maintain call bell within reach at all times, provide assistance with transfer out of bed and ambulation.   #28 out of 120 Oxycodone  5 mg remaining. Filled 06-06-15.

## 2015-06-29 NOTE — Progress Notes (Signed)
Patient's Name: Sheryl Barker  Patient type: Established  MRN: [id number]  Service setting: Ambulatory outpatient  DOB: [birth year]  Location: [facility]  DOS: 06/29/2015  Primary Care Physician: Bobetta Lime, MD  Note by: Kathlen Brunswick. Dossie Arbour, M.D, DABA, DABAPM, DABPM, DABIPP, FIPP  Referring Physician: Bobetta Lime, MD  Specialty: Board-Certified Interventional Pain Management     Primary Reason(s) for Visit: Encounter for prescription drug management (Level of risk: moderate) CC: Back Pain; Foot Pain; Shoulder Pain; and Muscular Dystrophy    HPI  Sheryl Barker is a 40 y.o. year old, female patient, who returns today as an established patient. 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[place]); Adynamia; Chest pain; Long term current use of opiate analgesic; Long term prescription opiate use; Opiate use (30 MME/Day); Opiate dependence ([place]); Encounter for therapeutic drug level monitoring; Chronic low back pain  (Location of Primary Source of Pain) (Bilateral) (L>R); Lumbar spondylosis (L>R); Chronic lower extremity pain (Location of Secondary source of pain) (Left); Chronic lumbar radicular pain (left side) (L5 Dermatome); Myotonic dystrophy, 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 Her primarily concern today is the Back Pain; Foot Pain; Shoulder Pain; and Muscular Dystrophy    Pain Assessment: Self-Reported Pain Score: 2  Reported  level is compatible with observation Pain Type: Chronic pain Pain Location: Back Pain Orientation: Lower Pain Descriptors / Indicators: Aching, Sharp Pain Frequency: Constant  The patient comes into the clinics today for pharmacological management of her chronic pain.  Date of Last Visit: 04/04/15 Service Provided on Last Visit: Med Refill  Controlled Substance Pharmacotherapy Assessment & REMS (Risk Evaluation and Mitigation Strategy)  Analgesic: Oxycodone IR 5 mg every 6 hours (20 mg per day) Pill Count: #28 out of 120 Oxycodone 5 mg remaining. Filled 06-06-15. MME/day: 30 mg/day Pharmacokinetics: Onset of action (Liberation/Absorption): Within expected pharmacological parameters Time to Peak effect (Distribution): Timing and results are as within normal expected parameters Duration of action (Metabolism/Excretion): Within normal limits for medication Pharmacodynamics: Analgesic Effect: More than 50% Activity Facilitation: Medication(s) allow patient to sit, stand, walk, and do the basic ADLs Perceived Effectiveness: Described as relatively effective, allowing for increase in activities of daily living (ADL) Side-effects or Adverse reactions: None reported Monitoring: [place] PMP: Online review of the past 25-month period conducted. Compliant with practice rules and regulations UDS Results/interpretation: The patient's last UDS was done on 04/04/2015 and it came back with unexpected results as the code on was not present. Today we discussed this results unable to appear that by the time she came back to the clinics she had already ran out of medication. Another possibility would be that she is not taking the medications as instructed. Medication Assessment Form: Reviewed. Patient indicates being compliant with therapy Treatment compliance: Compliant Risk Assessment: Aberrant Behavior: None observed today Substance Use Disorder (SUD) Risk Level: No change since last visit Risk of opioid  abuse or dependence: 0.7-3.0% with doses ? 36 MME/day and 6.1-26% with doses ? 120 MME/day. Opioid Risk Tool (ORT) Score: Total Score: 5 Moderate Risk for SUD (Score between 4-7) Depression Scale Score: PHQ-2: PHQ-2 Total Score: 0 No  depression (0) PHQ-9: PHQ-9 Total Score: 0 No depression (0-4)  Pharmacologic Plan: No change in therapy, at this time  Laboratory Chemistry  Inflammation Markers Lab Results  Component Value Date   ESRSEDRATE 12 01/07/2015   CRP 0.7 01/07/2015    Renal Function Lab Results  Component Value Date   BUN 10 01/07/2015   CREATININE 0.67 01/07/2015   GFRAA >60 01/07/2015   GFRNONAA >60 01/07/2015    Hepatic Function Lab Results  Component Value Date   AST 27 01/07/2015   ALT 35 01/07/2015   ALBUMIN 3.7 01/07/2015    Electrolytes Lab Results  Component Value Date   NA 139 01/07/2015   K 3.7 01/07/2015   CL 108 01/07/2015   CALCIUM 9.4 01/07/2015   MG 2.0 01/07/2015    Pain Modulating Vitamins No results found for: [place], VD125OH2TOT, [id number], [id number], VITAMINB12  Coagulation Parameters No results found for: INR, LABPROT  Note: I personally reviewed the above data. Results shared with patient.  Meds  The patient has a current medication list which includes the following prescription(s): duloxetine, fenofibrate, gabapentin, oxycodone, oxycodone, oxycodone, vitamin d (ergocalciferol), oxycodone, oxycodone, and oxycodone.  Current Outpatient Prescriptions on File Prior to Visit  Medication Sig  . DULoxetine (CYMBALTA) 20 MG capsule Take 20 mg by mouth 2 (two) times daily.  . fenofibrate (TRICOR) 145 MG tablet Take 145 mg by mouth daily.  Marland Kitchen gabapentin (NEURONTIN) 100 MG capsule Take 3 capsules (300 mg total) by mouth at bedtime.  Marland Kitchen oxycodone (OXY-IR) 5 MG capsule Take 1 capsule (5 mg total) by mouth every 6 (six) hours as needed for pain.  Marland Kitchen oxycodone (OXY-IR) 5 MG capsule Take 1 capsule (5 mg total) by mouth every 6 (six) hours as  needed for pain.  Marland Kitchen oxycodone (OXY-IR) 5 MG capsule Take 1 capsule (5 mg total) by mouth every 6 (six) hours as needed for pain.  . Vitamin D, Ergocalciferol, (DRISDOL) 50000 UNITS CAPS capsule Take 1 capsule (50,000 Units total) by mouth 2 (two) times a week. X 6 weeks.   No current facility-administered medications on file prior to visit.    ROS  Constitutional: Afebrile, no chills, well hydrated and well nourished Gastrointestinal: No upper or lower GI bleeding, no nausea, no vomiting and no acute GI distress Musculoskeletal: No acute joint swelling or redness, no acute loss of range of motion and no acute onset weakness Neurological: Denies any acute onset apraxia, no episodes of paralysis, no acute loss of coordination, no acute loss of consciousness and no acute onset aphasia, dysarthria, agnosia, or amnesia  Allergies  Sheryl Barker is allergic to ultram.  PFSH  Medical:  Sheryl Barker  has a past medical history of Muscular dystrophy [facility] MD (muscular dystrophy) ([place]); Hypercholesteremia; IBS (irritable bowel syndrome); Depression; Anxiety; Chest pain (05/04/2014); Broken ankle; and Vitamin D insufficiency (01/07/2015). Family: family history includes Hypertension in her father; Muscular dystrophy in her mother; Stroke in her mother. Surgical:  has past surgical history that includes Tubal ligation. Tobacco:  reports that she has never smoked. She does not have any smokeless tobacco history on file. Alcohol:  reports that she does not drink alcohol. Drug:  reports that she does not use illicit drugs.  Physical Examination  Constitutional Vitals: Blood pressure 99/67, pulse 66, temperature 98.3 F (36.8 C), temperature source Oral, resp. rate 16, height 5\' 6"  (1.676 m), weight 175 lb (79.379 kg), last menstrual period 06/06/2015, SpO2 98 %. Calculated BMI: Body mass index is 28.26 kg/(m^2). (25-29.9  kg/m2) Overweight - 20% higher incidence of chronic pain General appearance: Alert,  cooperative, oriented x 3, in no acute distress, well nourished, well developed, well hydrated Eyes: PERLA Respiratory: No evidence respiratory distress, no audible rales or ronchi and no use of accessory muscles of respiration Psych: Alert, oriented to person, oriented to place and oriented to time  Cervical Spine Exam  Inspection: Normal anatomy, no anomalies observed Cervical Lordosis: Normal Alignment: Symetrical Functional ROM: Within functional limits (WFL) AROM: WFL Sensory: No sensory anomalies reported or detected  Upper Extremity Exam    Right  Left  Inspection: No gross anomalies detected  Inspection: No gross anomalies detected  Functional ROM: Adequate  Functional ROM: Adequate  AROM: Adequate  AROM: Adequate  Sensory: No sensory anomalies reported or detected  Sensory: No sensory anomalies reported or detected  Motor: Unremarkable  Motor: Unremarkable  Vascular: Normal skin color, temperature, and hair growth. No peripheral edema or cyanosis  Vascular: Normal skin color, temperature, and hair growth. No peripheral edema or cyanosis   Thoracic Spine  Inspection: No gross anomalies detected Alignment: Symetrical Functional ROM: Within functional limits [facility]) AROM: Adequate Palpation: WNL  Lumbar Spine  Inspection: No gross anomalies detected Alignment: Symetrical Functional ROM: Within functional limits [facility]) AROM: Decreased Sensory: No sensory anomalies reported or detected Palpation: Tender Provocative Tests: Lumbar Hyperextension and rotation test: Positive for bilateral lumbar facet pain. Patrick's Maneuver: deferred  Gait Assessment  Gait: WNL  Lower Extremities    Right  Left  Inspection: No gross anomalies detected  Inspection: No gross anomalies detected  Functional ROM: Within functional limits [facility])  Functional ROM: Within functional limits (WFL)  AROM: Adequate  AROM: Adequate  Sensory: No sensory anomalies reported or detected  Sensory: No  sensory anomalies reported or detected  Motor: Unremarkable  Motor: Unremarkable  Toe walk (S1): WNL  Toe walk (S1): WNL  Heal walk (L5): WNL  Heal walk (L5): WNL   Assessment & Plan  Primary Diagnosis & Pertinent Problem List: The primary encounter diagnosis was Chronic pain. Diagnoses of Encounter for therapeutic drug level monitoring, Long term current use of opiate analgesic, Vitamin D deficiency, Neurogenic pain, and Opiate use (30 MME/Day) were also pertinent to this visit.  Visit Diagnosis: 1. Chronic pain   2. Encounter for therapeutic drug level monitoring   3. Long term current use of opiate analgesic   4. Vitamin D deficiency   5. Neurogenic pain   6. Opiate use (30 MME/Day)     Problems updated and reviewed during this visit: Problem  Neurogenic Pain  Myotonic Dystrophy (Hcc)  Opiate use (30 MME/Day)  Clinical Depression  Hld (Hyperlipidemia)    Problem-specific Plan(s): No problem-specific assessment & plan notes found for this encounter.  No new assessment & plan notes have been filed under this hospital service since the last note was generated. Service: Pain Management   Plan of Care   Problem List Items Addressed This Visit      High   Chronic pain - Primary (Chronic)   Relevant Medications   oxyCODONE (OXY IR/ROXICODONE) 5 MG immediate release tablet   oxyCODONE (OXY IR/ROXICODONE) 5 MG immediate release tablet   oxyCODONE (OXY IR/ROXICODONE) 5 MG immediate release tablet   Neurogenic pain (Chronic)   Vitamin D deficiency     Medium   Encounter for therapeutic drug level monitoring   Long term current use of opiate analgesic (Chronic)   Relevant Orders   ToxASSURE Select 13 (MW), Urine   Opiate  use (30 MME/Day) (Chronic)       Pharmacotherapy (Medications Ordered): Meds ordered this encounter  Medications  . oxyCODONE (OXY IR/ROXICODONE) 5 MG immediate release tablet    Sig: Take 1 tablet (5 mg total) by mouth every 6 (six) hours as needed  for severe pain.    Dispense:  120 tablet    Refill:  0    Do not add this medication to the electronic "Automatic Refill" notification system. Patient may have prescription filled one day early if pharmacy is closed on scheduled refill date. Do not fill until: 07/05/15 To last until: 08/04/15  . oxyCODONE (OXY IR/ROXICODONE) 5 MG immediate release tablet    Sig: Take 1 tablet (5 mg total) by mouth every 6 (six) hours as needed for severe pain.    Dispense:  120 tablet    Refill:  0    Do not add this medication to the electronic "Automatic Refill" notification system. Patient may have prescription filled one day early if pharmacy is closed on scheduled refill date. Do not fill until: 08/04/15 To last until: 09/03/15  . oxyCODONE (OXY IR/ROXICODONE) 5 MG immediate release tablet    Sig: Take 1 tablet (5 mg total) by mouth every 6 (six) hours as needed for severe pain.    Dispense:  120 tablet    Refill:  0    Do not add this medication to the electronic "Automatic Refill" notification system. Patient may have prescription filled one day early if pharmacy is closed on scheduled refill date. Do not fill until: 09/03/15 To last until: 10/03/15    [facility] & Procedure Ordered: Orders Placed This Encounter  Procedures  . ToxASSURE Select 13 (MW), Urine    Imaging Ordered: None  Interventional Therapies: Scheduled:  None at this time.    Considering:  Possible diagnostic lumbar facet Schutt with or without follow up with radiofrequency ablation. Possible palliative lumbar epidural steroid injections.    PRN Procedures:   1. Left L5-S1 lumbar epidural steroid injection under fluoroscopic guidance and without sedation.  2. Diagnostic bilateral lumbar facet Bourbon under fluoroscopic guidance and IV sedation.    Referral(s) or Consult(s): None at this time.  New Prescriptions   OXYCODONE (OXY IR/ROXICODONE) 5 MG IMMEDIATE RELEASE TABLET    Take 1 tablet (5 mg total) by mouth every 6  (six) hours as needed for severe pain.   OXYCODONE (OXY IR/ROXICODONE) 5 MG IMMEDIATE RELEASE TABLET    Take 1 tablet (5 mg total) by mouth every 6 (six) hours as needed for severe pain.   OXYCODONE (OXY IR/ROXICODONE) 5 MG IMMEDIATE RELEASE TABLET    Take 1 tablet (5 mg total) by mouth every 6 (six) hours as needed for severe pain.    Medications administered during this visit: Sheryl Barker had no medications administered during this visit.  No future appointments.  Primary Care Physician: Bobetta Lime, MD Location: [facility] Note by: Kathlen Brunswick. Dossie Arbour, M.D, DABA, DABAPM, DABPM, DABIPP, FIPP  Pain Score Disclaimer: We use the NRS-11 scale. This is a self-reported, subjective measurement of pain severity with only modest accuracy. It is used primarily to identify changes within a particular patient. It must be understood that outpatient pain scales are significantly less accurate that those used for research, where they can be applied under ideal controlled circumstances with minimal exposure to variables. In reality, the score is likely to be a combination of pain intensity and pain affect, where pain affect describes the degree of  emotional arousal or changes in action readiness caused by the sensory experience of pain. Factors such as social and work situation, setting, emotional state, anxiety levels, expectation, and prior pain experience may influence pain perception and show large inter-individual differences that may also be affected by time variables.

## 2015-07-05 LAB — TOXASSURE SELECT 13 (MW), URINE: PDF: 0

## 2015-09-19 ENCOUNTER — Ambulatory Visit (INDEPENDENT_AMBULATORY_CARE_PROVIDER_SITE_OTHER): Payer: PPO | Admitting: Unknown Physician Specialty

## 2015-09-19 ENCOUNTER — Encounter: Payer: Self-pay | Admitting: Unknown Physician Specialty

## 2015-09-19 VITALS — BP 122/83 | HR 86 | Temp 98.3°F | Ht 67.0 in | Wt 202.6 lb

## 2015-09-19 DIAGNOSIS — K5903 Drug induced constipation: Secondary | ICD-10-CM | POA: Diagnosis not present

## 2015-09-19 DIAGNOSIS — T402X5A Adverse effect of other opioids, initial encounter: Secondary | ICD-10-CM | POA: Insufficient documentation

## 2015-09-19 DIAGNOSIS — Z79891 Long term (current) use of opiate analgesic: Secondary | ICD-10-CM

## 2015-09-19 DIAGNOSIS — N939 Abnormal uterine and vaginal bleeding, unspecified: Secondary | ICD-10-CM | POA: Diagnosis not present

## 2015-09-19 DIAGNOSIS — K224 Dyskinesia of esophagus: Secondary | ICD-10-CM | POA: Diagnosis not present

## 2015-09-19 DIAGNOSIS — G7111 Myotonic muscular dystrophy: Secondary | ICD-10-CM | POA: Diagnosis not present

## 2015-09-19 DIAGNOSIS — E781 Pure hyperglyceridemia: Secondary | ICD-10-CM

## 2015-09-19 MED ORDER — DULOXETINE HCL 20 MG PO CPEP: 20.0000 mg | ORAL_CAPSULE | Freq: Two times a day (BID) | ORAL | Status: DC

## 2015-09-19 MED ORDER — DIAZEPAM 2 MG PO TABS: 2.0000 mg | ORAL_TABLET | Freq: Four times a day (QID) | ORAL | Status: DC | PRN

## 2015-09-19 MED ORDER — METHYLNALTREXONE BROMIDE 150 MG PO TABS: 3.0000 | ORAL_TABLET | Freq: Every day | ORAL | Status: DC

## 2015-09-19 NOTE — Assessment & Plan Note (Signed)
OK for very occasional use of valium

## 2015-09-19 NOTE — Assessment & Plan Note (Signed)
Does not want anything now.  It seems she requires Provera on occasion.  This was evaluated by gyn

## 2015-09-19 NOTE — Assessment & Plan Note (Addendum)
Need fasting Lipid panel.  Decide to restart meds after checking labs

## 2015-09-19 NOTE — Assessment & Plan Note (Signed)
Try Relestor indicated for opioid induced constipation

## 2015-09-19 NOTE — Progress Notes (Signed)
BP 122/83 mmHg  Pulse 86  Temp(Src) 98.3 F (36.8 C)  Ht 5\' 7"  (1.702 m)  Wt 202 lb 9.6 oz (91.899 kg)  BMI 31.72 kg/m2  SpO2 92%  LMP 09/10/2015 (Exact Date)   Subjective:    Patient ID: Sheryl Barker, female    DOB: 1975-06-30, 40 y.o.   MRN: [id number]  HPI: Sheryl Barker is a 40 y.o. female  Chief Complaint  Patient presents with  . Establish Care  . Medication Refill    pt states she needs duloxetine and fenofibrate refilled. Patient also would like a rx for valium, states she gets a pain in chest area and then she throws up. states they gave her the valium at the hospital and it helped  . Medication Problem    pt wnats to know of an OTC allergy medication and laxative that she could possibly take   She states she needs a new primary care doctor as her old one isn't on her plan.    She take Oxycodone 5 mg QID for chronic back pain caused by DDD, OA, and muscular dystrophy.    Pt states she gets a pain center of her chest.  When it starts hurting it makes her throw up which is constant lasting about 2 hours.  She was given Valium at the hospital which seemed to help.  She states the lowest dose twice a month would be sufficient.    She would like something for constipation due to opioids, IBS and MD  Not taking cholesterol meds for quite some time.  .    Abnormal uterine bleeding Pt states she often skips periods and now on a long period.  Does not want BCPs  Depression Doing well with cymbalta but doesn't want a strong dose Depression screen [facility] 2/9 09/19/2015 06/29/2015 04/04/2015 01/07/2015  Decreased Interest 0 0 0 0  Down, Depressed, Hopeless 0 0 0 0  PHQ - 2 Score 0 0 0 0  Altered sleeping 1 - - -  Tired, decreased energy 2 - - -  Change in appetite 0 - - -  Feeling bad or failure about yourself  1 - - -  Trouble concentrating 2 - - -  Moving slowly or fidgety/restless 1 - - -  Suicidal thoughts 0 - - -  PHQ-9 Score 7 - - -   Muscular dystrophy Pt states  she should have ankle braces.  Had been going to the [facility] clinic at [facility] but can't go now  Relevant past medical, surgical, family and social history reviewed and updated as indicated. Interim medical history since our last visit reviewed. Allergies and medications reviewed and updated.  Review of Systems  Per HPI unless specifically indicated above     Objective:    BP 122/83 mmHg  Pulse 86  Temp(Src) 98.3 F (36.8 C)  Ht 5\' 7"  (1.702 m)  Wt 202 lb 9.6 oz (91.899 kg)  BMI 31.72 kg/m2  SpO2 92%  LMP 09/10/2015 (Exact Date)  Wt Readings from Last 3 Encounters:  09/19/15 202 lb 9.6 oz (91.899 kg)  05/14/13 196 lb (88.905 kg)  06/29/15 175 lb (79.379 kg)    Physical Exam  Constitutional: She is oriented to person, place, and time. She appears well-developed and well-nourished. No distress.  HENT:  Head: Normocephalic and atraumatic.  Eyes: Conjunctivae and lids are normal. Right eye exhibits no discharge. Left eye exhibits no discharge. No scleral icterus.  Neck: Normal range of motion. Neck supple.  No JVD present. Carotid bruit is not present.  Cardiovascular: Normal rate, regular rhythm and normal heart sounds.   Pulmonary/Chest: Effort normal and breath sounds normal.  Abdominal: Normal appearance. There is no splenomegaly or hepatomegaly.  Musculoskeletal: Normal range of motion.  Neurological: She is alert and oriented to person, place, and time.  Skin: Skin is warm, dry and intact. No rash noted. No pallor.  Psychiatric: She has a normal mood and affect. Her behavior is normal. Judgment and thought content normal.    Results for orders placed or performed in visit on 09/12/15  HM PAP SMEAR  Result Value Ref Range   HM Pap smear per HDA       Assessment & Plan:   Problem List Items Addressed This Visit      Unprioritized   Abnormal uterine bleeding    Does not want anything now.  It seems she requires Provera on occasion.  This was evaluated by gyn       Esophagospasm    OK for very occasional use of valium      Hypertriglyceridemia    Need fasting Lipid panel.  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  Ask Dr. Melrose Nakayama about whether braces are advisable.      Therapeutic opioid induced constipation - Primary    Try Relestor indicated for opioid induced constipation          Follow up plan: Return in about 3 months (around 12/20/2015) for physical.

## 2015-09-19 NOTE — Assessment & Plan Note (Signed)
Per Pain clinic

## 2015-09-19 NOTE — Assessment & Plan Note (Signed)
Sees neurology.  Ask Dr. Melrose Nakayama about whether braces are advisable.

## 2015-09-21 ENCOUNTER — Encounter (INDEPENDENT_AMBULATORY_CARE_PROVIDER_SITE_OTHER): Payer: Self-pay

## 2015-09-21 ENCOUNTER — Encounter: Payer: Self-pay | Admitting: Pain Medicine

## 2015-09-21 ENCOUNTER — Other Ambulatory Visit
Admission: RE | Admit: 2015-09-21 | Discharge: 2015-09-21 | Disposition: A | Discharge status: Home / Self Care | Payer: PPO | Source: Ambulatory Visit | Attending: Pain Medicine | Admitting: Pain Medicine

## 2015-09-21 ENCOUNTER — Ambulatory Visit: Payer: PPO | Attending: Pain Medicine | Admitting: Pain Medicine

## 2015-09-21 ENCOUNTER — Telehealth: Payer: Self-pay | Admitting: Family Medicine

## 2015-09-21 VITALS — BP 111/78 | HR 87 | Temp 98.3°F | Resp 18 | Ht 66.0 in | Wt 198.0 lb

## 2015-09-21 DIAGNOSIS — M4316 Spondylolisthesis, lumbar region: Secondary | ICD-10-CM | POA: Insufficient documentation

## 2015-09-21 DIAGNOSIS — F411 Generalized anxiety disorder: Secondary | ICD-10-CM | POA: Diagnosis not present

## 2015-09-21 DIAGNOSIS — E781 Pure hyperglyceridemia: Secondary | ICD-10-CM | POA: Diagnosis not present

## 2015-09-21 DIAGNOSIS — G7111 Myotonic muscular dystrophy: Secondary | ICD-10-CM | POA: Diagnosis not present

## 2015-09-21 DIAGNOSIS — M47816 Spondylosis without myelopathy or radiculopathy, lumbar region: Secondary | ICD-10-CM | POA: Insufficient documentation

## 2015-09-21 DIAGNOSIS — T402X5A Adverse effect of other opioids, initial encounter: Secondary | ICD-10-CM | POA: Insufficient documentation

## 2015-09-21 DIAGNOSIS — N939 Abnormal uterine and vaginal bleeding, unspecified: Secondary | ICD-10-CM | POA: Insufficient documentation

## 2015-09-21 DIAGNOSIS — E559 Vitamin D deficiency, unspecified: Secondary | ICD-10-CM | POA: Insufficient documentation

## 2015-09-21 DIAGNOSIS — F3289 Other specified depressive episodes: Secondary | ICD-10-CM | POA: Insufficient documentation

## 2015-09-21 DIAGNOSIS — Z79891 Long term (current) use of opiate analgesic: Secondary | ICD-10-CM | POA: Insufficient documentation

## 2015-09-21 DIAGNOSIS — E669 Obesity, unspecified: Secondary | ICD-10-CM | POA: Insufficient documentation

## 2015-09-21 DIAGNOSIS — M792 Neuralgia and neuritis, unspecified: Secondary | ICD-10-CM

## 2015-09-21 DIAGNOSIS — M545 Low back pain: Secondary | ICD-10-CM | POA: Diagnosis not present

## 2015-09-21 DIAGNOSIS — G8929 Other chronic pain: Secondary | ICD-10-CM

## 2015-09-21 DIAGNOSIS — F1190 Opioid use, unspecified, uncomplicated: Secondary | ICD-10-CM | POA: Diagnosis not present

## 2015-09-21 DIAGNOSIS — Z6831 Body mass index (BMI) 31.0-31.9, adult: Secondary | ICD-10-CM | POA: Diagnosis not present

## 2015-09-21 DIAGNOSIS — E785 Hyperlipidemia, unspecified: Secondary | ICD-10-CM | POA: Diagnosis not present

## 2015-09-21 DIAGNOSIS — Z5181 Encounter for therapeutic drug level monitoring: Secondary | ICD-10-CM | POA: Diagnosis not present

## 2015-09-21 DIAGNOSIS — K5903 Drug induced constipation: Secondary | ICD-10-CM | POA: Diagnosis not present

## 2015-09-21 DIAGNOSIS — M549 Dorsalgia, unspecified: Secondary | ICD-10-CM | POA: Diagnosis not present

## 2015-09-21 DIAGNOSIS — E538 Deficiency of other specified B group vitamins: Secondary | ICD-10-CM | POA: Insufficient documentation

## 2015-09-21 DIAGNOSIS — M5117 Intervertebral disc disorders with radiculopathy, lumbosacral region: Secondary | ICD-10-CM | POA: Insufficient documentation

## 2015-09-21 DIAGNOSIS — K224 Dyskinesia of esophagus: Secondary | ICD-10-CM | POA: Insufficient documentation

## 2015-09-21 DIAGNOSIS — Z87440 Personal history of urinary (tract) infections: Secondary | ICD-10-CM | POA: Diagnosis not present

## 2015-09-21 LAB — COMPREHENSIVE METABOLIC PANEL
ALT: 70 U/L — ABNORMAL HIGH (ref 14–54)
AST: 68 U/L — ABNORMAL HIGH (ref 15–41)
Albumin: 3.6 g/dL (ref 3.5–5.0)
Alkaline Phosphatase: 132 U/L — ABNORMAL HIGH (ref 38–126)
Anion gap: 5 (ref 5–15)
BUN: 14 mg/dL (ref 6–20)
CHLORIDE: 105 mmol/L (ref 101–111)
CO2: 30 mmol/L (ref 22–32)
Calcium: 9.9 mg/dL (ref 8.9–10.3)
Creatinine, Ser: 0.65 mg/dL (ref 0.44–1.00)
Glucose, Bld: 130 mg/dL — ABNORMAL HIGH (ref 65–99)
POTASSIUM: 3.5 mmol/L (ref 3.5–5.1)
Sodium: 140 mmol/L (ref 135–145)
Total Bilirubin: 0.7 mg/dL (ref 0.3–1.2)
Total Protein: 7 g/dL (ref 6.5–8.1)

## 2015-09-21 LAB — MAGNESIUM: MAGNESIUM: 2 mg/dL (ref 1.7–2.4)

## 2015-09-21 LAB — C-REACTIVE PROTEIN: CRP: 4 mg/dL — AB (ref <1.0)

## 2015-09-21 LAB — VITAMIN B12: Vitamin B-12: 299 pg/mL (ref 180–914)

## 2015-09-21 LAB — SEDIMENTATION RATE: Sed Rate: 29 mm/hr — ABNORMAL HIGH (ref 0–20)

## 2015-09-21 MED ORDER — OXYCODONE HCL 5 MG PO TABS: 5.0000 mg | ORAL_TABLET | Freq: Four times a day (QID) | ORAL | Status: DC | PRN

## 2015-09-21 MED ORDER — BISACODYL 5 MG PO TBEC: 10.0000 mg | DELAYED_RELEASE_TABLET | Freq: Every evening | ORAL | Status: DC | PRN

## 2015-09-21 MED ORDER — BENEFIBER PO POWD: ORAL | Status: DC

## 2015-09-21 MED ORDER — GABAPENTIN 100 MG PO CAPS: 100.0000 mg | ORAL_CAPSULE | Freq: Four times a day (QID) | ORAL | Status: DC

## 2015-09-21 MED ORDER — DOCUSATE SODIUM 100 MG PO CAPS
200.0000 mg | ORAL_CAPSULE | Freq: Every evening | ORAL | Status: DC | PRN
Start: 2015-09-21 — End: 2015-11-16

## 2015-09-21 MED ORDER — IMIPRAMINE HCL 10 MG PO TABS: 10.0000 mg | ORAL_TABLET | Freq: Every day | ORAL | Status: DC | PRN

## 2015-09-21 NOTE — Telephone Encounter (Signed)
OK.  I will rx short acting Imiprimine in case of esophogeal spasm

## 2015-09-21 NOTE — Telephone Encounter (Addendum)
OK.  FYI, this is the wrong number.

## 2015-09-21 NOTE — Telephone Encounter (Signed)
Pt called stated her pain doctor stated the valium that was prescribed may counteract with one of her other medications. Wants to know if something else can be called in for her. Pharm is [facility] [address] is currently taking oxycodone as well which mixed with valium can cause breathing issues. Thanks.

## 2015-09-21 NOTE — Patient Instructions (Signed)
Instructed to [facility].  Script given for oxycodone x 3

## 2015-09-21 NOTE — Progress Notes (Signed)
Safety precautions to be maintained throughout the outpatient stay will include: orient to surroundings, keep bed in low position, maintain call bell within reach at all times, provide assistance with transfer out of bed and ambulation.   Oxycodone 5 mg # 49 out of 120 remaining. Filled 09-03-15.

## 2015-09-21 NOTE — Telephone Encounter (Signed)
Routing to provider  

## 2015-09-21 NOTE — Progress Notes (Signed)
Patient's Name: Sheryl Barker  Patient type: Established  MRN: [id number]  Service setting: Ambulatory outpatient  DOB: 09-12-1975  Location: [facility]  DOS: 09/21/2015  Primary Care Physician: Bobetta Lime, MD  Note by: Kathlen Brunswick. Dossie Arbour, M.D, DABA, [place], DABPM, Milagros Evener, FIPP  Referring Physician: Bobetta Lime, MD  Specialty: Board-Certified Interventional Pain Management  Last Visit to Pain Management: 06/29/2015   Primary Reason(s) for Visit: Encounter for prescription drug management (Level of risk: moderate) CC: Back Pain   HPI  Sheryl Barker is a 40 y.o. year old, female patient, who returns today as an established patient. 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[place]); [place]; Chest pain; Long term current use of opiate analgesic; Long term prescription opiate use; Opiate use (30 MME/Day); Opiate dependence ([place]); Encounter for therapeutic drug level monitoring; Chronic low back pain  (Location of Primary Source of Pain) (Bilateral) (L>R); Lumbar spondylosis (L>R); Chronic lower extremity pain (Location of Secondary source of pain) (Left); Chronic lumbar radicular pain (left side) (L5 Dermatome); Myotonic dystrophy, 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 Her primarily concern today is the Back  Pain   Pain Assessment: Self-Reported Pain Score: 6  Clinically the patient looks like a 2/10 Reported level is inconsistent with clinical obrservations Information on the proper use of the pain score provided to the patient today. Pain Type: Chronic pain Pain Location: Back Pain Orientation: Upper, Mid, Lower Pain Descriptors / Indicators: Aching Pain Frequency: Constant  The patient comes into the clinics today for pharmacological management of her chronic pain. I last saw this patient on 06/29/2015. The patient  reports that she does not use illicit drugs. Her body mass index is 31.97 kg/(m^2).  Date of Last Visit: 06/29/15 Service Provided on Last Visit: Med Refill  Controlled Substance Pharmacotherapy Assessment & REMS (Risk Evaluation and Mitigation Strategy)  Analgesic: Oxycodone IR 5 mg every 6 hours (20 mg per day) MME/day: 30 mg/day Pill Count: Oxycodone 5 mg # 49 out of 120 remaining. Filled 09-03-15. Pharmacokinetics: Onset of action (Liberation/Absorption): Within expected pharmacological parameters Time to Peak effect (Distribution): Timing and results are as within normal expected parameters Duration of action (Metabolism/Excretion): Within normal limits for medication Pharmacodynamics: Analgesic Effect: More than 50% Activity Facilitation: Medication(s) allow patient to sit, stand, walk, and do the basic ADLs Perceived Effectiveness: Described as relatively effective, allowing for increase in activities of daily living (ADL) Side-effects or Adverse reactions: None reported Monitoring: [place] PMP: Online review of the past 108-month period conducted. Compliant with practice rules and regulations Last UDS on record: TOXASSURE SELECT 13  Date Value Ref Range Status  06/29/2015 FINAL  Final    Comment:    ==================================================================== TOXASSURE SELECT 13 (MW) ==================================================================== Test                              Result       Flag       Units Drug Present and Declared for Prescription Verification   Oxycodone  697          EXPECTED   ng/mg creat   Oxymorphone                    951          EXPECTED   ng/mg creat   Noroxycodone                   912          EXPECTED   ng/mg creat   Noroxymorphone                 288          EXPECTED   ng/mg creat    Sources of oxycodone are scheduled prescription medications.    Oxymorphone, noroxycodone, and noroxymorphone are expected    metabolites of oxycodone. Oxymorphone is also available as a    scheduled prescription medication. ==================================================================== Test                      Result    Flag   Units      Ref Range   Creatinine              136              mg/dL      >=20 ==================================================================== Declared Medications:  The flagging and interpretation on this report are based on the  following declared medications.  Unexpected results may arise from  inaccuracies in the declared medications.  **Note: The testing scope of this panel includes these medications:  Oxycodone  Oxycodone (Oxy-IR)  **Note: The testing scope of this panel does not include following  reported medications:  Duloxetine  Fenofibrate (Tricor)  Gabapentin  Vitamin D2 (Ergocalciferol) ==================================================================== For clinical consultation, please call [phone]. ====================================================================    UDS interpretation: Compliant          Medication Assessment Form: Reviewed. Patient indicates being compliant with therapy Treatment compliance: Compliant Risk Assessment: Aberrant Behavior: None observed today Substance Use Disorder (SUD) Risk Level: Moderate-to-high Risk of opioid abuse or dependence: 0.7-3.0% with doses ? 36 MME/day and 6.1-26% with doses ? 120  MME/day. Opioid Risk Tool (ORT) Score: Total Score: 7 Moderate Risk for SUD (Score between 4-7) Depression Scale Score: PHQ-2: PHQ-2 Total Score: 0 No depression (0) PHQ-9: PHQ-9 Total Score: 0 No depression (0-4)  Pharmacologic Plan: No change in therapy, at this time  Laboratory Chemistry  Inflammation Markers Lab Results  Component Value Date   ESRSEDRATE 29* 09/21/2015   CRP 0.7 01/07/2015    Renal Function Lab Results  Component Value Date   BUN 14 09/21/2015   CREATININE 0.65 09/21/2015   GFRAA >60 09/21/2015   GFRNONAA >60 09/21/2015    Hepatic Function Lab Results  Component Value Date   AST 68* 09/21/2015   ALT 70* 09/21/2015   ALBUMIN 3.6 09/21/2015    Electrolytes Lab Results  Component Value Date   NA 140 09/21/2015   K 3.5 09/21/2015   CL 105 09/21/2015   CALCIUM 9.9 09/21/2015   MG 2.0 09/21/2015    Pain Modulating Vitamins Lab Results  Component Value Date   25OHVITD1 8.2* 01/07/2015   25OHVITD2 3.2 01/07/2015   25OHVITD3 5.0 01/07/2015    Coagulation Parameters Lab Results  Component Value Date   PLT 214 06/19/2014    Cardiovascular Lab Results  Component Value Date   BNP 59 09/17/2013   HGB 14.1 06/19/2014  HCT 42.6 06/19/2014    Note: Lab results reviewed.  Recent Diagnostic Imaging  Shoulder Imaging: Shoulder-L MR wo contrast:  Results for orders placed in visit on 01/24/13  MR Shoulder Left Wo Contrast   Narrative * PRIOR REPORT IMPORTED FROM AN EXTERNAL SYSTEM *   CLINICAL DATA:  Posterolateral shoulder pain increasing with motion  for 2 months. No acute injury or prior relevant surgery.   EXAM:  MRI OF THE LEFT SHOULDER WITHOUT CONTRAST   TECHNIQUE:  Multiplanar, multisequence MR imaging of the shoulder was performed.  No intravenous contrast was administered.   COMPARISON:  None.   FINDINGS:  Rotator cuff: Intact with mild supraspinatus tendinosis. There is no  evidence of rotator cuff tear. The  infraspinous, subscapularis and  teres minor tendons demonstrate no significant findings.   Muscles:  No focal muscular atrophy or edema.   Biceps long head:  Intact and normally positioned.   Acromioclavicular Joint: The acromion is type 2. There are mild  acromioclavicular degenerative changes. No significant fluid is  present in the subacromial - subdeltoid bursa.   Glenohumeral Joint: No significant shoulder joint effusion or  glenohumeral arthropathy. There is some joint capsular thickening in  the axillary recess with limited visualization of the humeral  attachment of the inferior glenohumeral ligament.   Labrum: Labral assessment is limited by motion and the lack of joint  fluid. No labral tear identified.   Bones: No significant extra-articular osseous findings. There is no  Hill-Sachs deformity.   IMPRESSION:  1. Joint capsular edema in the axillary recess may represent  adhesive capsulitis in the absence of trauma. There is no Hill-Sachs  deformity or given history of dislocation to suggest humeral  avulsion of the glenohumeral ligament.  2. Intact rotator cuff with mild supraspinatus tendinosis.  3. Mild acromioclavicular degenerative changes.    Electronically Signed    By: Camie Patience M.D.    On: 01/24/2013 17:25       Lumbosacral Imaging: Lumbar MR wo contrast:  Results for orders placed during the hospital encounter of 08/13/14  MR Lumbar Spine Wo Contrast   Narrative CLINICAL DATA:  40 year old female with muscular dystrophy. Left buttock pain since February which for 3 weeks has been radiating down the left lower extremity medially, and medially to the groin for 1 week. Initial encounter.  EXAM: MRI LUMBAR SPINE WITHOUT CONTRAST  TECHNIQUE: Multiplanar, multisequence MR imaging of the lumbar spine was performed. No intravenous contrast was administered.  COMPARISON:  Lumbar radiographs 10/16/2013. Chest radiographs 10/14/2012.  FINDINGS: 12  full size ribs demonstrated in 2014. Normal lumbar segmentation other than a small S1-S2 disc space, but the S1 level is otherwise sacralized.  Mild retrolisthesis of L5 on S1. Mild straightening of upper lumbar lordosis. Otherwise normal vertebral height and alignment. No marrow edema or evidence of acute osseous abnormality.  Visualized lower thoracic spinal cord is normal with conus medularis at L1-L2.  Visualized abdominal viscera and paraspinal soft tissues are within normal limits.  T11-T12:  Negative aside from mild facet hypertrophy.  T12-L1:  Negative.  L1-L2:  Negative.  L2-L3:  Negative.  L3-L4:  Negative.  L4-L5: Mild disc desiccation and circumferential disc bulge. Mild facet and ligament flavum hypertrophy greater on the left. Superimposed small broad-based left foraminal disc protrusion with annular fissure best seen on series 2, image 12 and series 5, image 29. This is in proximity to the exiting left L4 nerve. No significant stenosis.  L5-S1: Mild disc desiccation and circumferential  disc bulge. Subtle left subarticular disc protrusion with annular fissure best seen on series 2, image 11, also on series 5, image 35. No stenosis, but this finding is in proximity to the exiting left L5 and descending left S1 nerves. Superimposed mild facet hypertrophy.  IMPRESSION: 1. Mild L4-L5 and L5-S1 disc degeneration with small disc herniations at both levels which could be a source for left side L4 through S1 radiculitis as above. 2. No lumbar spinal stenosis, and other lumbar levels are negative.   Electronically Signed   By: Genevie Ann M.D.   On: 08/13/2014 15:53    Lumbar DG 2-3 views:  Results for orders placed in visit on 10/16/13  DG Lumbar Spine 2-3 Views   Narrative * PRIOR REPORT IMPORTED FROM AN EXTERNAL SYSTEM *   CLINICAL DATA:  Fall, back pain   EXAM:  LUMBAR SPINE - 2-3 VIEW   COMPARISON:  None.   FINDINGS:  Normal alignment of lumbar  vertebral bodies. No loss of vertebral  body height or disc height. No pars fracture. No subluxation.   IMPRESSION:  No acute osseous abnormality.    Electronically Signed    By: Suzy Bouchard M.D.    On: 10/16/2013 20:39       Note: Imaging results reviewed.  Meds  The patient has a current medication list which includes the following prescription(s): gabapentin, oxycodone, oxycodone, oxycodone, bisacodyl, docusate sodium, imipramine, and benefiber.  No current outpatient prescriptions on file prior to visit.   No current facility-administered medications on file prior to visit.    ROS  Constitutional: Denies any fever or chills Gastrointestinal: No reported hemesis, hematochezia, vomiting, or acute GI distress Musculoskeletal: Denies any acute onset joint swelling, redness, loss of ROM, or weakness Neurological: No reported episodes of acute onset apraxia, aphasia, dysarthria, agnosia, amnesia, paralysis, loss of coordination, or loss of consciousness  Allergies  Ms. Casler is allergic to morphine and related; other; and ultram.  PFSH  Medical:  Ms. Guzy  has a past medical history of Muscular dystrophy ([place]); MD (muscular dystrophy) ([place]); Hypercholesteremia; IBS (irritable bowel syndrome); Depression; Anxiety; Chest pain (05/04/2014); Broken ankle; Vitamin D insufficiency (01/07/2015); and Fibroid. Family: family history includes Arthritis in her mother; Diabetes in her mother and paternal grandmother; Drug abuse in her brother; Gout in her father; Hyperlipidemia in her mother; Hypertension in her father; Mental illness in her mother; Muscular dystrophy in her mother and sister; Seizures in her mother; Stroke in her mother. Surgical:  has past surgical history that includes Tubal ligation. Tobacco:  reports that she has never smoked. She has never used smokeless tobacco. Alcohol:  reports that she does not drink alcohol. Drug:  reports that she does not use illicit  drugs.  Constitutional Exam  Vitals: Blood pressure 111/78, pulse 87, temperature 98.3 F (36.8 C), temperature source Oral, resp. rate 18, height 5\' 6"  (1.676 m), weight 198 lb (89.812 kg), last menstrual period 09/10/2015, SpO2 93 %. General appearance: Well nourished, well developed, and well hydrated. In no acute distress Calculated BMI/Body habitus: Body mass index is 31.97 kg/(m^2). (30-34.9 kg/m2) Obese (Class I) - 68% higher incidence of chronic pain Psych/Mental status: Alert and oriented x 3 (person, place, & time) Eyes: PERLA Respiratory: No evidence of acute respiratory distress  Cervical Spine Exam  Inspection: No masses, redness, or swelling Alignment: Symmetrical ROM: Functional: ROM is within functional limits [facility]) Stability: No instability detected Muscle strength & Tone: Functionally intact Sensory: Unimpaired Palpation: No complaints of tenderness  Upper Extremity (UE) Exam    Side: Right upper extremity  Side: Left upper extremity  Inspection: No masses, redness, swelling, or asymmetry  Inspection: No masses, redness, swelling, or asymmetry  ROM:  ROM:  Functional: ROM is within functional limits [facility])        Functional: ROM is within functional limits [facility])        Muscle strength & Tone: Functionally intact  Muscle strength & Tone: Functionally intact  Sensory: Unimpaired  Sensory: Unimpaired  Palpation: No complaints of tenderness  Palpation: No complaints of tenderness   Thoracic Spine Exam  Inspection: No masses, redness, or swelling Alignment: Symmetrical ROM: Functional: ROM is within functional limits [facility]) Stability: No instability detected Sensory: Unimpaired Muscle strength & Tone: Functionally intact Palpation: No complaints of tenderness  Lumbar Spine Exam  Inspection: No masses, redness, or swelling Alignment: Symmetrical ROM: Functional: Reduced ROM Stability: No instability detected Muscle strength & Tone: Functionally intact Sensory:  Movement-associated discomfort Palpation: Area tender to palpation Provocative Tests: Lumbar Hyperextension and rotation test: Positive bilaterally for facet joint pain. Patrick's Maneuver: evaluation deferred today              Gait & Posture Assessment  Ambulation: Unassisted Gait: Antalgic Posture: WNL   Lower Extremity Exam    Side: Right lower extremity  Side: Left lower extremity  Inspection: No masses, redness, swelling, or asymmetry ROM:  Inspection: No masses, redness, swelling, or asymmetry ROM:  Functional: ROM is within functional limits [facility])        Functional: ROM is within functional limits [facility])        Muscle strength & Tone: Functionally intact  Muscle strength & Tone: Functionally intact  Sensory: Unimpaired  Sensory: Unimpaired  Palpation: No complaints of tenderness  Palpation: No complaints of tenderness   Assessment & Plan  Primary Diagnosis & Pertinent Problem List: The primary encounter diagnosis was Chronic pain. 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  Visit Diagnosis: 1. Chronic pain   2. Long term current use of opiate analgesic   3. Encounter for therapeutic drug level monitoring   4. Opiate use (30 MME/Day)   5. Chronic low back pain  (Location of Primary Source of Pain) (Bilateral) (L>R)   6. 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 Congenital myotonic dystrophy ([place])   8. Opioid-induced constipation (OIC)   9. Neurogenic pain     Problems updated and reviewed during this visit: Problem  Opioid-induced constipation (OIC)  Clinical Depression  Hld  (Hyperlipidemia)  Chest Pain    Problem-specific Plan(s): No problem-specific assessment & plan notes found for this encounter.  No new assessment & plan notes have been filed under this hospital service since the last note was generated. Service: Pain Management   Plan of Care   Problem List Items Addressed This Visit      High   Chronic low back pain  (Location of Primary Source of Pain) (Bilateral) (L>R) (Chronic)   Relevant Medications   oxyCODONE (OXY IR/ROXICODONE) 5 MG immediate release tablet   oxyCODONE (OXY IR/ROXICODONE) 5 MG immediate release tablet   oxyCODONE (OXY IR/ROXICODONE) 5 MG immediate release tablet   Chronic pain -  Primary (Chronic)   Relevant Medications   oxyCODONE (OXY IR/ROXICODONE) 5 MG immediate release tablet   oxyCODONE (OXY IR/ROXICODONE) 5 MG immediate release tablet   oxyCODONE (OXY IR/ROXICODONE) 5 MG immediate release tablet   gabapentin (NEURONTIN) 100 MG capsule   Other Relevant Orders   Comprehensive metabolic panel (Completed)   C-reactive protein   Magnesium (Completed)   Sedimentation rate (Completed)   Vitamin B12   25-Hydroxyvitamin D Lcms D2+D3   Congenital myotonic dystrophy (HCC) (Chronic)   Myotonic dystrophy, 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 oxyCODONE (OXY IR/ROXICODONE) 5 MG immediate release tablet    Sig: Take 1 tablet (5 mg  total) by mouth every 6 (six) hours as needed for severe pain.    Dispense:  120 tablet    Refill:  0    Do not add this medication to the electronic "Automatic Refill" notification system. Patient may have prescription filled one day early if pharmacy is closed on scheduled refill date. Do not fill until: 10/03/15 To last until: 11/02/15  . oxyCODONE (OXY IR/ROXICODONE) 5 MG immediate release tablet    Sig: Take 1 tablet (5 mg total) by mouth every 6 (six) hours as needed for severe pain.    Dispense:  120 tablet    Refill:  0    Do not add this medication to the electronic "Automatic Refill" notification system. Patient may have prescription filled one day early if pharmacy is closed on scheduled refill date. Do not fill until: 11/02/15 To last until: 12/02/15  . oxyCODONE (OXY IR/ROXICODONE) 5 MG immediate release tablet    Sig: Take 1 tablet (5 mg total) by mouth every 6 (six) hours as needed for severe pain.    Dispense:  120 tablet    Refill:  0    Do not add this medication to the electronic "Automatic Refill" notification system. Patient may have prescription filled one day early if pharmacy is closed on scheduled refill date. Do not fill until: 12/02/15 To last until: 01/01/16  . Wheat Dextrin (BENEFIBER) POWD    Sig: Stir 2 tsp. TID into 4-8 oz of any non-carbonated beverage or soft food (hot or cold)    Dispense:  500 g    Refill:  PRN    This is an OTC product. This prescription is to serve as a reminder to the patient as to our preference.  . docusate sodium (COLACE) 100 MG capsule    Sig: Take 2 capsules (200 mg total) by mouth at bedtime as needed for moderate constipation. Do not use longer than 7 days.    Dispense:  60 capsule    Refill:  PRN    Do not place this medication, or any other prescription from our practice, on "Automatic Refill". Patient may have prescription filled one day early if pharmacy is closed on scheduled refill date.  . bisacodyl (  DULCOLAX) 5 MG EC  tablet    Sig: Take 2 tablets (10 mg total) by mouth at bedtime as needed for moderate constipation ((Hold for loose stool)).    Dispense:  100 tablet    Refill:  PRN    Do not place this medication, or any other prescription from our practice, on "Automatic Refill". Patient may have prescription filled one day early if pharmacy is closed on scheduled refill date.  . gabapentin (NEURONTIN) 100 MG capsule    Sig: Take 1-3 capsules (100-300 mg total) by mouth 4 (four) times daily. Follow titration schedule.    Dispense:  360 capsule    Refill:  2    Do not place this medication, or any other prescription from our practice, on "Automatic Refill". Patient may have prescription filled one day early if pharmacy is closed on scheduled refill date.    Lab-work & Procedure Ordered: Orders Placed This Encounter  Procedures  . ToxASSURE Select 13 (MW), Urine  . Comprehensive metabolic panel  . C-reactive protein  . Magnesium  . Sedimentation rate  . Vitamin B12  . 25-Hydroxyvitamin D Lcms D2+D3    Imaging Ordered: None  Interventional Therapies: Scheduled: None at this time.    Considering: Possible diagnostic lumbar facet [place] with or without follow up with radiofrequency ablation. Possible palliative lumbar epidural steroid injections.    PRN Procedures:  1. Left L5-S1 lumbar epidural steroid injection under fluoroscopic guidance and without sedation.  2. Diagnostic bilateral lumbar facet [place] under fluoroscopic guidance and IV sedation.        Referral(s) or Consult(s): None at this time.  New Prescriptions   BISACODYL (DULCOLAX) 5 MG EC TABLET    Take 2 tablets (10 mg total) by mouth at bedtime as needed for moderate constipation ((Hold for loose stool)).   DOCUSATE SODIUM (COLACE) 100 MG CAPSULE    Take 2 capsules (200 mg total) by mouth at bedtime as needed for moderate constipation. Do not use longer than 7 days.   IMIPRAMINE (TOFRANIL) 10 MG TABLET    Take 1  tablet (10 mg total) by mouth daily as needed (In case of chest pain due to esphogeal spasm).   WHEAT DEXTRIN (BENEFIBER) POWD    Stir 2 tsp. TID into 4-8 oz of any non-carbonated beverage or soft food (hot or cold)    Medications administered during this visit: Ms. Bendell had no medications administered during this visit.  Requested PM Follow-up: Return in about 3 months (around 12/19/2015) for Med-Mgmt, (3-Mo).  Future Appointments Date Time Provider [place]  10/06/2015 12:30 PM Milinda Pointer, MD [facility] None  12/19/2015 10:00 AM Valerie Roys, DO [facility] None  12/19/2015 1:00 PM Milinda Pointer, MD [facility] None    Primary Care Physician: Bobetta Lime, MD Location: [facility] Note by: Kathlen Brunswick. Dossie Arbour, M.D, DABA, DABAPM, DABPM, DABIPP, FIPP  Pain Score Disclaimer: We use the NRS-11 scale. This is a self-reported, subjective measurement of pain severity with only modest accuracy. It is used primarily to identify changes within a particular patient. It must be understood that outpatient pain scales are significantly less accurate that those used for research, where they can be applied under ideal controlled circumstances with minimal exposure to variables. In reality, the score is likely to be a combination of pain intensity and pain affect, where pain affect describes the degree of emotional arousal or changes in action readiness caused by the sensory experience of pain. Factors such as social and  work situation, setting, emotional state, anxiety levels, expectation, and prior pain experience may influence pain perception and show large inter-individual differences that may also be affected by time variables.  Patient instructions provided during this appointment: Patient Instructions  In[facility]  Script given for oxycodone x 3

## 2015-09-22 NOTE — Telephone Encounter (Signed)
Called and left patient a voicemail letting her know that new medication was sent in.

## 2015-09-24 LAB — 25-HYDROXY VITAMIN D LCMS D2+D3: 25-Hydroxy, Vitamin D: 14 ng/mL — ABNORMAL LOW

## 2015-09-24 LAB — 25-HYDROXYVITAMIN D LCMS D2+D3
25-HYDROXY, VITAMIN D-2: 9.7 ng/mL
25-HYDROXY, VITAMIN D-3: 4.3 ng/mL

## 2015-09-27 ENCOUNTER — Other Ambulatory Visit: Payer: Self-pay | Admitting: Pain Medicine

## 2015-09-27 DIAGNOSIS — R748 Abnormal levels of other serum enzymes: Secondary | ICD-10-CM

## 2015-09-27 DIAGNOSIS — E559 Vitamin D deficiency, unspecified: Secondary | ICD-10-CM

## 2015-09-27 DIAGNOSIS — R7982 Elevated C-reactive protein (CRP): Secondary | ICD-10-CM | POA: Insufficient documentation

## 2015-09-27 DIAGNOSIS — R700 Elevated erythrocyte sedimentation rate: Secondary | ICD-10-CM

## 2015-09-27 MED ORDER — VITAMIN D (ERGOCALCIFEROL) 1.25 MG (50000 UNIT) PO CAPS: ORAL_CAPSULE | ORAL | 0 refills | Status: DC

## 2015-09-27 MED ORDER — VITAMIN D3 50 MCG (2000 UT) PO CAPS: ORAL_CAPSULE | ORAL | 99 refills | Status: DC

## 2015-09-27 NOTE — Progress Notes (Signed)
Normal levels of C-Reactive Protein for our Lab are less than 1.0 mg/L. C-reactive protein (CRP) is produced by the liver. The level of CRP rises when there is inflammation throughout the body. CRP goes up in response to inflammation. High levels suggests the presence of chronic inflammation but do not identify its location or cause. High levels have been observed in obese patients, individuals with bacterial infections, chronic inflammation, or flare-ups of inflammatory conditions. Drops of previously elevated levels suggest that the inflammation or infection is subsiding and/or responding to treatment.A normal sedimentation rate should be below 30 mm/hr. The sed rate is an acute phase reactant that indirectly measures the degree of inflammation present in the body. It can be acute, developing rapidly after trauma, injury or infection, for example, or can occur over an extended time (chronic) with conditions such as autoimmune diseases or cancer. The ESR is not diagnostic; it is a non-specific, screening test that may be elevated in a number of these different conditions. It provides general information about the presence or absence of an inflammatory condition.The combined elevation of the ESR & CRP, may be suggestive of an autoimmune disease. Should this be the case, we will inquire if the patient has had a rheumatologic evaluation looking at  the RF levels, ANA levels, and CBC.

## 2015-09-27 NOTE — Progress Notes (Signed)
Normal fasting (NPO x 8 hours) glucose levels are between 65-99 mg/dl, with 2 hour fasting, levels are usually less than 140 mg/dl. Any random blood glucose level greater than 200 mg/dl is considered to be Diabetes. Normal levels of AST are between 5 and 40 U/L. Pregnancy, a muscle injection, or even strenuous exercise may increase AST levels. Acute burns, surgery, and seizures may raise AST levels as well. Very high levels of AST (> 10 X normal) are usually due to acute hepatitis. Levels > 100 X normal can be seen with liver exposure to hepatotoxic substances. Moderate increases may be seen in other diseases of the liver, especially when the bile ducts are blocked, or with cirrhosis or certain cancers of the liver. AST may also increase after heart attacks and with muscle injury, usually to a much greater degree than ALT. In most types of liver disease, the ALT level is higher than AST and the AST/ALT ratio will be low (less than 1). With heart or muscle injury, AST is often much higher than ALT (often 3-5 times as high) and levels tend to stay higher than ALT for longer than with liver injury. The normal range for ALT (SGPT) values is about 7 to 56 units per liter. Muscle injections and/or strenuous exercise, may increase alanine aminotransferase (ALT) levels. Many drugs may raise levels by causing liver damage. Other causes of moderate increases include bile duct obstruction, cirrhosis, heart damage, alcohol abuse, and liver tumors. In most types of liver diseases, the ALT level is higher than AST, leading to a low AST/ALT ratio( >1). Exceptions include alcoholic or acute hepatitis, cirrhosis, as well as heart and/or muscle injury. Levels  (>10 X normal) may be seen with acute hepatitis while results (sometimes >100 X normal) may indicate liver exposure to toxic substances. Normal ALP (Alkaline phosphatase) levels are between 35 -105 IU/L, for our Lab. High ALP could suggest liver damage or increased bone cell  activity. If other tests such as bilirubin, aspartate aminotransferase (AST), or alanine aminotransferase (ALT) are also high, usually the increased ALP is coming from the liver. Higher-than-normal ALP levels can be seen with: biliary obstruction; bone conditions; osteoblastic bone tumors; osteomalacia; a healing fracture; liver disease; hepatitis; eating a fatty meal if you have blood type O or B; hyperparathyroidism; leukemia; lymphoma; Paget disease; rickets; and/or sarcoidosis.

## 2015-09-27 NOTE — Progress Notes (Signed)
  Low Vitamin D Results Normal levels: between 30 and 100 ng/mL. Vitamin D Insufficiency: Levels between 20-30 ng/ml are defined as a "Vitamin D insufficiency". Vitamin D Deficiency: Levels below 20 ng/ml, is diagnosed as a "Vitamin D Deficiency".  Common causes include: dietary insufficiency; inadequate sun exposure; inability to absorb vitamin D from the intestines; or inability to process it due to kidney or liver disease. Low 25-hydroxyvitamin D: A low blood level of 25-hydroxyvitamin D may mean that a person is not getting enough exposure to sunlight or enough dietary vitamin D to meet his or her body's demand or that there is a problem with its absorption from the intestines. Occasionally, drugs used to treat seizures, particularly phenytoin (Dilantin), can interfere with the production of 25-hydroxyvitamin D in the liver. There is some evidence that vitamin D deficiency may increase the risk of some cancers, immune diseases, and cardiovascular disease. Low 1,25-dihydroxyvitamin D: A low level of 1,25-dihydroxyvitamin D can be seen in kidney disease and is one of the earliest changes to occur in persons with early kidney failure. Associated complications may include: hypocalcemia, hypophosphatemia, and reduced bone density. Associated symptoms: Vitamin D deficiencies and insufficiencies may be associated with fatigue, weakness, bone pain, joint pain, and muscle pain. Recommendations: Patient may benefit from taking over-the-counter Vitamin D3 supplements. I recommend a vitamin D + Calcium supplements. "Natures Bounty", a brand easily found in most pharmacies, has a formulation containing Calcium 1200 mg plus Vitamin D3 1000 IU, in Softgels capsules that are easy to swallow. This should be taken once a day, preferably in the morning as vitamin D will increase energy levels and make it difficult to fall asleep, if taken at night. Patients with levels lower than 20 ng/ml should contact their primary  care physicians to receive replacement therapy. Vitamin D3 can be obtained over-the-counter, without a prescription. Vitamin D2 requires a prescription and it is used for replacement therapy.

## 2015-09-28 ENCOUNTER — Telehealth: Payer: Self-pay

## 2015-09-28 NOTE — Telephone Encounter (Signed)
Pre procedure instructions given with teach back 3 done.  Time confirmed.

## 2015-09-28 NOTE — Telephone Encounter (Signed)
Pt wants to know any special instructions before she comes to get her procedure

## 2015-09-30 LAB — TOXASSURE SELECT 13 (MW), URINE: PDF: 0

## 2015-10-06 ENCOUNTER — Encounter: Payer: Self-pay | Admitting: Pain Medicine

## 2015-10-06 ENCOUNTER — Ambulatory Visit: Payer: PPO | Attending: Pain Medicine | Admitting: Pain Medicine

## 2015-10-06 VITALS — BP 110/80 | HR 76 | Temp 98.3°F | Resp 12 | Ht 66.0 in | Wt 195.0 lb

## 2015-10-06 DIAGNOSIS — E781 Pure hyperglyceridemia: Secondary | ICD-10-CM | POA: Diagnosis not present

## 2015-10-06 DIAGNOSIS — M47896 Other spondylosis, lumbar region: Secondary | ICD-10-CM | POA: Diagnosis not present

## 2015-10-06 DIAGNOSIS — G7111 Myotonic muscular dystrophy: Secondary | ICD-10-CM | POA: Insufficient documentation

## 2015-10-06 DIAGNOSIS — E538 Deficiency of other specified B group vitamins: Secondary | ICD-10-CM | POA: Diagnosis not present

## 2015-10-06 DIAGNOSIS — E785 Hyperlipidemia, unspecified: Secondary | ICD-10-CM | POA: Diagnosis not present

## 2015-10-06 DIAGNOSIS — F411 Generalized anxiety disorder: Secondary | ICD-10-CM | POA: Diagnosis not present

## 2015-10-06 DIAGNOSIS — N939 Abnormal uterine and vaginal bleeding, unspecified: Secondary | ICD-10-CM | POA: Insufficient documentation

## 2015-10-06 DIAGNOSIS — R700 Elevated erythrocyte sedimentation rate: Secondary | ICD-10-CM | POA: Insufficient documentation

## 2015-10-06 DIAGNOSIS — R748 Abnormal levels of other serum enzymes: Secondary | ICD-10-CM | POA: Diagnosis not present

## 2015-10-06 DIAGNOSIS — F329 Major depressive disorder, single episode, unspecified: Secondary | ICD-10-CM | POA: Insufficient documentation

## 2015-10-06 DIAGNOSIS — R7982 Elevated C-reactive protein (CRP): Secondary | ICD-10-CM | POA: Insufficient documentation

## 2015-10-06 DIAGNOSIS — K224 Dyskinesia of esophagus: Secondary | ICD-10-CM | POA: Diagnosis not present

## 2015-10-06 DIAGNOSIS — K5903 Drug induced constipation: Secondary | ICD-10-CM | POA: Insufficient documentation

## 2015-10-06 DIAGNOSIS — E669 Obesity, unspecified: Secondary | ICD-10-CM | POA: Insufficient documentation

## 2015-10-06 DIAGNOSIS — M4726 Other spondylosis with radiculopathy, lumbar region: Secondary | ICD-10-CM | POA: Insufficient documentation

## 2015-10-06 DIAGNOSIS — Z79891 Long term (current) use of opiate analgesic: Secondary | ICD-10-CM | POA: Insufficient documentation

## 2015-10-06 DIAGNOSIS — M4316 Spondylolisthesis, lumbar region: Secondary | ICD-10-CM | POA: Diagnosis not present

## 2015-10-06 DIAGNOSIS — M791 Myalgia: Secondary | ICD-10-CM | POA: Diagnosis not present

## 2015-10-06 DIAGNOSIS — M545 Low back pain: Secondary | ICD-10-CM | POA: Insufficient documentation

## 2015-10-06 DIAGNOSIS — E559 Vitamin D deficiency, unspecified: Secondary | ICD-10-CM | POA: Insufficient documentation

## 2015-10-06 DIAGNOSIS — G8929 Other chronic pain: Secondary | ICD-10-CM | POA: Diagnosis not present

## 2015-10-06 DIAGNOSIS — M47816 Spondylosis without myelopathy or radiculopathy, lumbar region: Secondary | ICD-10-CM | POA: Diagnosis not present

## 2015-10-06 DIAGNOSIS — R079 Chest pain, unspecified: Secondary | ICD-10-CM | POA: Diagnosis not present

## 2015-10-06 DIAGNOSIS — M549 Dorsalgia, unspecified: Secondary | ICD-10-CM | POA: Diagnosis not present

## 2015-10-06 MED ORDER — LACTATED RINGERS IV SOLN: 1000.0000 mL | Freq: Once | INTRAVENOUS | Status: DC

## 2015-10-06 MED ORDER — FENTANYL CITRATE (PF) 100 MCG/2ML IJ SOLN
INTRAMUSCULAR | Status: AC
  Filled 2015-10-06: qty 2

## 2015-10-06 MED ORDER — TRIAMCINOLONE ACETONIDE 40 MG/ML IJ SUSP
INTRAMUSCULAR | Status: AC
  Administered 2015-10-06: 14:00:00
  Filled 2015-10-06: qty 2

## 2015-10-06 MED ORDER — ROPIVACAINE HCL 2 MG/ML IJ SOLN
INTRAMUSCULAR | Status: AC
  Administered 2015-10-06: 14:00:00
  Filled 2015-10-06: qty 20

## 2015-10-06 MED ORDER — ROPIVACAINE HCL 2 MG/ML IJ SOLN: 9.0000 mL | Freq: Once | INTRAMUSCULAR | Status: DC

## 2015-10-06 MED ORDER — TRIAMCINOLONE ACETONIDE 40 MG/ML IJ SUSP: 40.0000 mg | Freq: Once | INTRAMUSCULAR | Status: DC

## 2015-10-06 MED ORDER — MIDAZOLAM HCL 5 MG/5ML IJ SOLN
INTRAMUSCULAR | Status: AC
  Administered 2015-10-06: 3 mg
  Filled 2015-10-06: qty 5

## 2015-10-06 MED ORDER — MIDAZOLAM HCL 5 MG/5ML IJ SOLN: 1.0000 mg | INTRAMUSCULAR | Status: DC | PRN

## 2015-10-06 MED ORDER — LIDOCAINE HCL (PF) 1 % IJ SOLN: 10.0000 mL | Freq: Once | INTRAMUSCULAR | Status: DC

## 2015-10-06 NOTE — Progress Notes (Signed)
Patient's Name: Sheryl Barker  Patient type: Established  MRN: [id number]  Service setting: Ambulatory outpatient  DOB: 07/04/1975  Location: [facility]  DOS: 10/06/2015  Primary Care Physician: Bobetta Lime, MD  Note by: Kathlen Brunswick. Dossie Arbour, M.D, DABA, DABAPM, DABPM, Milagros Evener, FIPP  Referring Physician: Bobetta Lime, MD  Specialty: Board-Certified Interventional Pain Management  Last Visit to Pain Management: 09/28/2015   Primary Reason(s) for Visit: Interventional Pain Management Treatment. CC: Back Pain (center)  Facet hypertrophy of lumbar region [M47.896]   Procedure:  Anesthesia, Analgesia, Anxiolysis:  Type: Diagnostic Medial Branch Facet Bo[place] Region: Lumbar Level: L2, L3, L4, L5, & S1 Medial Branch Level(s) Laterality: Bilateral  Indications: 1. Lumbar facet hypertrophy (Bilateral) (L>R)   2. Chronic low back pain  (Location of Primary Source of Pain) (Bilateral) (L>R)   3. Lumbar spondylosis, unspecified spinal osteoarthritis     Pre-procedure Pain Score: 6/10 Reported level of pain is compatible with clinical observations Post-procedure Pain Score: 2  (NSR)  Type: Moderate (Conscious) Sedation & Local Anesthesia Local Anesthetic: Lidocaine 1% Route: Intravenous (IV) IV Access: Secured Sedation: Meaningful verbal contact was maintained at all times during the procedure  Indication(s): Analgesia & Anxiolysis   Pre-Procedure Assessment:  Sheryl Barker is a 40 y.o. year old, female patient, seen today for interventional treatment. 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[place]); Adynamia; Chest pain; Long term current use of opiate analgesic; Long term prescription opiate use; Opiate use (30 MME/Day); Opiate dependence ([place]); Encounter for therapeutic drug level  monitoring; Chronic low back pain  (Location of Primary Source of Pain) (Bilateral) (L>R); Lumbar spondylosis (L>R); Chronic lower extremity pain (Location of Secondary source of pain) (Left); Chronic lumbar radicular pain (left side) (L5 Dermatome); Myotonic dystrophy, 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 Her primarily concern today is the Back Pain (center)   Pain Type: Chronic pain Pain Location: Back Pain Orientation: Lower Pain Descriptors / Indicators: Aching Pain Frequency: Intermittent  Date of Last Visit: 09/21/15 Service Provided on Last Visit: Evaluation  Coagulation Parameters Lab Results  Component Value Date   PLT 214 06/19/2014    Verification of the correct person, correct site (including marking of site), and correct procedure were performed and confirmed by the patient.  Consent: Secured. Under the influence of no sedatives a written informed consent was obtained, after having provided information on the risks and possible complications. To fulfill our ethical and legal obligations, as recommended by the American Medical Association's Code of Ethics, we have provided information to the patient about our clinical impression; the nature and purpose of the treatment or procedure; the risks, benefits, and possible complications of the intervention; alternatives; the risk(s) and benefit(s) of the alternative treatment(s) or  procedure(s); and the risk(s) and benefit(s) of doing nothing. The patient was provided information about the risks and possible complications associated with the procedure. These include, but are not limited to, failure to achieve desired goals, infection, bleeding, organ or nerve damage, allergic reactions, paralysis, and  death. In the case of spinal procedures these may include, but are not limited to, failure to achieve desired goals, infection, bleeding, organ or nerve damage, allergic reactions, paralysis, and death. In addition, the patient was informed that Medicine is not an exact science; therefore, there is also the possibility of unforeseen risks and possible complications that may result in a catastrophic outcome. The patient indicated having understood very clearly. We have given the patient no guarantees and we have made no promises. Enough time was given to the patient to ask questions, all of which were answered to the patient's satisfaction.  Consent Attestation: I, the ordering provider, attest that I have discussed with the patient the benefits, risks, side-effects, alternatives, likelihood of achieving goals, and potential problems during recovery for the procedure that I have provided informed consent.  Pre-Procedure Preparation: Safety Precautions: Allergies reviewed. Appropriate site, procedure, and patient were confirmed by following the Joint Commission's Universal Protocol (UP.01.01.01), in the form of a "Time Out". The patient was asked to confirm marked site and procedure, before commencing. The patient was asked about blood thinners, or active infections, both of which were denied. Patient was assessed for positional comfort and all pressure points were checked before starting procedure. Allergies: She is allergic to morphine and related; other; and ultram [tramadol].. Infection Control Precautions: Sterile technique used. Standard Universal Precautions were taken as recommended  by the Department of [facility] for Disease Control and Prevention (CDC). Standard pre-surgical skin prep was conducted. Respiratory hygiene and cough etiquette was practiced. Hand hygiene observed. Safe injection practices and needle disposal techniques followed. SDV (single dose vial) medications used. Medications properly checked for expiration dates and contaminants. Personal protective equipment (PPE) used: Sterile Radiation-resistant gloves. Monitoring:  As per clinic protocol. Vitals:   10/06/15 1346 10/06/15 1354 10/06/15 1404 10/06/15 1415  BP: 108/88 107/80 105/82 110/80  Pulse: 85 82 75 76  Resp: (!) 22 13 12 12   Temp:      TempSrc:      SpO2: 96% 95% 92% 95%  Weight:      Height:      Calculated BMI: Body mass index is 31.47 kg/m.  Description of Procedure Process:   Time-out: "Time-out" completed before starting procedure, as per protocol. Position: Prone Target Area: For Lumbar Facet blocks, the target is the groove formed by the junction of the transverse process and superior articular process. For the L5 dorsal ramus, the target is the notch between superior articular process and sacral ala. For the S1 dorsal ramus, the target is the superior and lateral edge of the posterior S1 Sacral foramen. Approach: Paramedial approach. Area Prepped: Entire Posterior Lumbosacral Region Prepping solution: ChloraPrep (2% chlorhexidine gluconate and 70% isopropyl alcohol) Safety Precautions: Aspiration looking for blood return was conducted prior to all injections. At no point did we inject any substances, as a needle was being advanced. No attempts were made at seeking any paresthesias. Safe injection practices and needle disposal techniques used. Medications properly checked for expiration dates. SDV (single dose vial) medications used.   Description of the Procedure: Protocol guidelines were followed. The patient was placed in position over the fluoroscopy table. The target area  was identified and the area prepped in the usual manner. Skin desensitized using vapocoolant spray. Skin & deeper tissues infiltrated with local anesthetic. Appropriate amount of time allowed to pass for local anesthetics to take effect. The procedure needle was introduced through the skin, ipsilateral to the reported pain, and advanced to the target  area. Employing the "Medial Branch Technique", the needles were advanced to the angle made by the superior and medial portion of the transverse process, and the lateral and inferior portion of the superior articulating process of the targeted vertebral bodies. This area is known as "Burton's Eye" or the "Eye of the [place] Dog". A procedure needle was introduced through the skin, and this time advanced to the angle made by the superior and medial border of the sacral ala, and the lateral border of the S1 vertebral body. This last needle was later repositioned at the superior and lateral border of the posterior S1 foramen. Negative aspiration confirmed. Solution injected in intermittent fashion, asking for systemic symptoms every 0.5cc of injectate. The needles were then removed and the area cleansed, making sure to leave some of the prepping solution back to take advantage of its long term bactericidal properties. EBL: None Materials & Medications Used:  Needle(s) Used: 22g - 3.5" Spinal Needle(s)  Imaging Guidance:   Type of Imaging Technique: Fluoroscopy Guidance (Spinal) Indication(s): Assistance in needle guidance and placement for procedures requiring needle placement in or near specific anatomical locations not easily accessible without such assistance. Exposure Time: Please see nurses notes. Contrast: None required. Fluoroscopic Guidance: I was personally present in the fluoroscopy suite, where the patient was placed in position for the procedure, over the fluoroscopy-compatible table. Fluoroscopy was manipulated, using "Tunnel Vision Technique", to  obtain the best possible view of the target area, on the affected side. Parallax error was corrected before commencing the procedure. A "direction-depth-direction" technique was used to introduce the needle under continuous pulsed fluoroscopic guidance. Once the target was reached, antero-posterior, oblique, and lateral fluoroscopic projection views were taken to confirm needle placement in all planes. Permanently recorded images stored by scanning into EMR. Interpretation: Intraoperative imaging interpretation by performing Physician. Adequate needle placement confirmed. Adequate needle placement confirmed in AP, lateral, & Oblique Views. No contrast injected.  Antibiotic Prophylaxis:  Indication(s): No indications identified. Type:  Antibiotics Given (last 72 hours)    None       Post-operative Assessment:   Complications: No immediate post-treatment complications were observed. Disposition: Return to clinic for follow-up evaluation. The patient tolerated the entire procedure well. A repeat set of vitals were taken after the procedure and the patient was kept under observation following institutional policy, for this procedure. Post-procedural neurological assessment was performed, showing return to baseline, prior to discharge. The patient was discharged home, once institutional criteria were met. The patient was provided with post-procedure discharge instructions, including a section on how to identify potential problems. Should any problems arise concerning this procedure, the patient was given instructions to immediately contact us, at any time, without hesitation. In any case, we plan to contact the patient by telephone for a follow-up status report regarding this interventional procedure. Comments:  No additional relevant information.  Plan of Care   Problem List Items Addressed This Visit      High   Chronic low back pain  (Location of Primary Source of Pain) (Bilateral) (L>R) (Chronic)    Relevant Medications   triamcinolone acetonide (KENALOG-40) injection 40 mg   fentaNYL (SUBLIMAZE) 100 MCG/2ML injection   triamcinolone acetonide (KENALOG-40) 40 MG/ML injection (Completed)   Lumbar facet hypertrophy (Bilateral) (L>R) - Primary (Chronic)   Relevant Medications   lactated ringers infusion 1,000 mL   midazolam (VERSED) 5 MG/5ML injection 1-2 mg   triamcinolone acetonide (KENALOG-40) injection 40 mg   lidocaine (PF) (XYLOCAINE) 1 % injection 10 mL  ropivacaine (PF) 2 mg/ml (0.2%) (NAROPIN) epidural 9 mL   Lumbar spondylosis (L>R) (Chronic)   Relevant Medications   triamcinolone acetonide (KENALOG-40) injection 40 mg   fentaNYL (SUBLIMAZE) 100 MCG/2ML injection   triamcinolone acetonide (KENALOG-40) 40 MG/ML injection (Completed)    Other Visit Diagnoses   None.      Pharmacotherapy (Medications Ordered): Meds ordered this encounter  Medications  . lactated ringers infusion 1,000 mL  . midazolam (VERSED) 5 MG/5ML injection 1-2 mg    Make sure Flumazenil is available in the pyxis when using this medication. If oversedation occurs, administer 0.2 mg IV over 15 sec. If after 45 sec no response, administer 0.2 mg again over 1 min; may repeat at 1 min intervals; not to exceed 4 doses (1 mg)  . triamcinolone acetonide (KENALOG-40) injection 40 mg  . lidocaine (PF) (XYLOCAINE) 1 % injection 10 mL  . ropivacaine (PF) 2 mg/ml (0.2%) (NAROPIN) epidural 9 mL  . ropivacaine (PF) 2 mg/ml (0.2%) (NAROPIN) 2 MG/ML epidural    TICE, KORI: cabinet override  . fentaNYL (SUBLIMAZE) 100 MCG/2ML injection    TICE, KORI: cabinet override  . triamcinolone acetonide (KENALOG-40) 40 MG/ML injection    TICE, KORI: cabinet override  . midazolam (VERSED) 5 MG/5ML injection    TICE, KORI: cabinet override    Lab-work & Procedure Ordered: No orders of the defined types were placed in this encounter.   New medications started at this visit: New Prescriptions   No medications on file     Medications administered during this visit: We administered ropivacaine (PF) 2 mg/ml (0.2%), triamcinolone acetonide, and midazolam.  Requested PM Follow-up: Return in about 2 weeks (around 10/20/2015) for Post-Procedure evaluation.  Future Appointments Date Time Provider [place]  10/26/2015 8:40 AM Milinda Pointer, MD [facility] None  12/19/2015 10:00 AM Valerie Roys, DO [facility] None  12/19/2015 1:00 PM Milinda Pointer, MD [facility] None    Primary Care Physician: Bobetta Lime, MD Location: [facility] Note by: Kathlen Brunswick. Dossie Arbour, M.D, DABA, DABAPM, DABPM, DABIPP, FIPP   Illustration of the posterior view of the lumbar spine and the posterior neural structures. Laminae of L2 through S1 are labeled. DPRL5, dorsal primary ramus of L5; DPRS1, dorsal primary ramus of S1; DPR3, dorsal primary ramus of L3; FJ, facet (zygapophyseal) joint L3-L4; I, inferior articular process of L4; LB1, lateral branch of dorsal primary ramus of L1; IAB, inferior articular branches from L3 medial branch (supplies L4-L5 facet joint); IBP, intermediate branch plexus; MB3, medial branch of dorsal primary ramus of L3; NR3, third lumbar nerve root; S, superior articular process of L5; SAB, superior articular branches from L4 (supplies L4-5 facet joint also); TP3, transverse process of L3.  Disclaimer:  Medicine is not an Chief Strategy Officer. The only guarantee in medicine is that nothing is guaranteed. It is important to note that the decision to proceed with this intervention was based on the information collected from the patient. The Data and conclusions were drawn from the patient's questionnaire, the interview, and the physical examination. Because the information was provided in large part by the patient, it cannot be guaranteed that it has not been purposely or unconsciously manipulated. Every effort has been made to obtain as much relevant data as possible for this  evaluation. It is important to note that the conclusions that lead to this procedure are derived in large part from the available data. Always take into account that the treatment will also be dependent on availability of  resources and existing treatment guidelines, considered by other Pain Management Practitioners as being common knowledge and practice, at the time of the intervention. For Medico-Legal purposes, it is also important to point out that variation in procedural techniques and pharmacological choices are the acceptable norm. The indications, contraindications, technique, and results of the above procedure should only be interpreted and judged by a Board-Certified Interventional Pain Specialist with extensive familiarity and expertise in the same exact procedure and technique. Attempts at providing opinions without similar or greater experience and expertise than that of the treating physician will be considered as inappropriate and unethical, and shall result in a formal complaint to the state medical board and applicable specialty societies.

## 2015-10-06 NOTE — Patient Instructions (Signed)
Pain Management Discharge Instructions  General Discharge Instructions :  If you need to reach your doctor call: Monday-Friday 8:00 am - 4:00 pm at [phone] or toll free [phone].  After clinic hours [phone] to have operator reach doctor.  Bring all of your medication bottles to all your appointments in the pain clinic.  To cancel or reschedule your appointment with Pain Management please remember to call 24 hours in advance to avoid a fee.  Refer to the educational materials which you have been given on: General Risks, I had my Procedure. Discharge Instructions, Post Sedation.  Post Procedure Instructions:  The drugs you were given will stay in your system until tomorrow, so for the next 24 hours you should not drive, make any legal decisions or drink any alcoholic beverages.  You may eat anything you prefer, but it is better to start with liquids then soups and crackers, and gradually work up to solid foods.  Please notify your doctor immediately if you have any unusual bleeding, trouble breathing or pain that is not related to your normal pain.  Depending on the type of procedure that was done, some parts of your body may feel week and/or numb.  This usually clears up by tonight or the next day.  Walk with the use of an assistive device or accompanied by an adult for the 24 hours.  You may use ice on the affected area for the first 24 hours.  Put ice in a Ziploc bag and cover with a towel and place against area 15 minutes on 15 minutes off.  You may switch to heat after 24 hours.Facet Joint [place] The facet joints connect the bones of the spine (vertebrae). They make it possible for you to bend, twist, and make other movements with your spine. They also prevent you from overbending, overtwisting, and making other excessive movements.  A facet joint Falor is a procedure where a numbing medicine (anesthetic) is injected into a facet joint. Often, a type of anti-inflammatory  medicine called a steroid is also injected. A facet joint Scorza may be done for two reasons:   Diagnosis. A facet joint Tindall may be done as a test to see whether neck or back pain is caused by a worn-down or infected facet joint. If the pain gets better after a facet joint Hisle, it means the pain is probably coming from the facet joint. If the pain does not get better, it means the pain is probably not coming from the facet joint.   Therapy. A facet joint Robello may be done to relieve neck or back pain caused by a facet joint. A facet joint Fosco is only done as a therapy if the pain does not improve with medicine, exercise programs, physical therapy, and other forms of pain management. LET YOUR HEALTH CARE PROVIDER KNOW ABOUT:   Any allergies you have.   All medicines you are taking, including vitamins, herbs, eyedrops, and over-the-counter medicines and creams.   Previous problems you or members of your family have had with the use of anesthetics.   Any blood disorders you have had.   Other health problems you have. RISKS AND COMPLICATIONS Generally, having a facet joint Jakubek is safe. However, as with any procedure, complications can occur. Possible complications associated with having a facet joint Cianci include:   Bleeding.   Injury to a nerve near the injection site.   Pain at the injection site.   Weakness or numbness in areas controlled by nerves near   the injection site.   Infection.   Temporary fluid retention.   Allergic reaction to anesthetics or medicines used during the procedure. BEFORE THE PROCEDURE   Follow your health care provider's instructions if you are taking dietary supplements or medicines. You may need to stop taking them or reduce your dosage.   Do not take any new dietary supplements or medicines without asking your health care provider first.   Follow your health care provider's instructions about eating and drinking before the  procedure. You may need to stop eating and drinking several hours before the procedure.   Arrange to have an adult drive you home after the procedure. PROCEDURE  You may need to remove your clothing and dress in an open-back gown so that your health care provider can access your spine.   The procedure will be done while you are lying on an X-ray table. Most of the time you will be asked to lie on your stomach, but you may be asked to lie in a different position if an injection will be made in your neck.   Special machines will be used to monitor your oxygen levels, heart rate, and blood pressure.   If an injection will be made in your neck, an intravenous (IV) tube will be inserted into one of your veins. Fluids and medicine will flow directly into your body through the IV tube.   The area over the facet joint where the injection will be made will be cleaned with an antiseptic soap. The surrounding skin will be covered with sterile drapes.   An anesthetic will be applied to your skin to make the injection area numb. You may feel a temporary stinging or burning sensation.   A video X-ray machine will be used to locate the joint. A contrast dye may be injected into the facet joint area to help with locating the joint.   When the joint is located, an anesthetic medicine will be injected into the joint through the needle.   Your health care provider will ask you whether you feel pain relief. If you do feel relief, a steroid may be injected to provide pain relief for a longer period of time. If you do not feel relief or feel only partial relief, additional injections of an anesthetic may be made in other facet joints.   The needle will be removed, the skin will be cleansed, and bandages will be applied.  AFTER THE PROCEDURE   You will be observed for 15-30 minutes before being allowed to go home. Do not drive. Have an adult drive you or take a taxi or public transportation instead.    If you feel pain relief, the pain will return in several hours or days when the anesthetic wears off.   You may feel pain relief 2-14 days after the procedure. The amount of time this relief lasts varies from person to person.   It is normal to feel some tenderness over the injected area(s) for 2 days following the procedure.   If you have diabetes, you may have a temporary increase in blood sugar.   This information is not intended to replace advice given to you by your health care provider. Make sure you discuss any questions you have with your health care provider.   Document Released: 07/11/2006 Document Revised: 03/12/2014 Document Reviewed: 12/10/2011 Elsevier Interactive Patient Education 2016 [employer]  

## 2015-10-07 ENCOUNTER — Telehealth: Payer: Self-pay

## 2015-10-07 NOTE — Telephone Encounter (Signed)
Post procedure phone call.  No answer, no answering machine 

## 2015-10-14 DIAGNOSIS — W010XXA Fall on same level from slipping, tripping and stumbling without subsequent striking against object, initial encounter: Secondary | ICD-10-CM | POA: Diagnosis not present

## 2015-10-14 DIAGNOSIS — Z885 Allergy status to narcotic agent status: Secondary | ICD-10-CM | POA: Diagnosis not present

## 2015-10-14 DIAGNOSIS — E7800 Pure hypercholesterolemia, unspecified: Secondary | ICD-10-CM | POA: Diagnosis not present

## 2015-10-14 DIAGNOSIS — X501XXA Overexertion from prolonged static or awkward postures, initial encounter: Secondary | ICD-10-CM | POA: Diagnosis not present

## 2015-10-14 DIAGNOSIS — G710 Muscular dystrophy: Secondary | ICD-10-CM | POA: Diagnosis not present

## 2015-10-14 DIAGNOSIS — S99922A Unspecified injury of left foot, initial encounter: Secondary | ICD-10-CM | POA: Diagnosis not present

## 2015-10-14 DIAGNOSIS — Z79899 Other long term (current) drug therapy: Secondary | ICD-10-CM | POA: Diagnosis not present

## 2015-10-14 DIAGNOSIS — M25572 Pain in left ankle and joints of left foot: Secondary | ICD-10-CM | POA: Diagnosis not present

## 2015-10-14 DIAGNOSIS — M25472 Effusion, left ankle: Secondary | ICD-10-CM | POA: Diagnosis not present

## 2015-10-14 DIAGNOSIS — S99912A Unspecified injury of left ankle, initial encounter: Secondary | ICD-10-CM | POA: Diagnosis not present

## 2015-10-24 ENCOUNTER — Telehealth: Payer: Self-pay | Admitting: *Deleted

## 2015-10-26 ENCOUNTER — Ambulatory Visit: Payer: PPO | Admitting: Pain Medicine

## 2015-11-16 ENCOUNTER — Encounter: Payer: Self-pay | Admitting: Pain Medicine

## 2015-11-16 ENCOUNTER — Ambulatory Visit: Payer: PPO | Attending: Pain Medicine | Admitting: Pain Medicine

## 2015-11-16 VITALS — BP 117/83 | HR 72 | Temp 98.4°F | Resp 16 | Ht 66.0 in | Wt 195.0 lb

## 2015-11-16 DIAGNOSIS — M47899 Other spondylosis, site unspecified: Secondary | ICD-10-CM | POA: Diagnosis not present

## 2015-11-16 DIAGNOSIS — M47816 Spondylosis without myelopathy or radiculopathy, lumbar region: Secondary | ICD-10-CM | POA: Diagnosis not present

## 2015-11-16 DIAGNOSIS — M47896 Other spondylosis, lumbar region: Secondary | ICD-10-CM | POA: Diagnosis not present

## 2015-11-16 DIAGNOSIS — G8929 Other chronic pain: Secondary | ICD-10-CM | POA: Insufficient documentation

## 2015-11-16 DIAGNOSIS — M545 Low back pain, unspecified: Secondary | ICD-10-CM

## 2015-11-16 DIAGNOSIS — M5489 Other dorsalgia: Secondary | ICD-10-CM | POA: Diagnosis not present

## 2015-11-16 NOTE — Progress Notes (Addendum)
Safety precautions to be maintained throughout the outpatient stay will include: orient to surroundings, keep bed in low position, maintain call bell within reach at all times, provide assistance with transfer out of bed and ambulation.  States she fractured 3 bones in her left and sprained left foot from a fall.   Patient did not bring medications to appointment.  States she left them lying on the bed. Patient offerred to go get them if needed.  Reminded to bring to all appointments.

## 2015-11-16 NOTE — Patient Instructions (Signed)
Pain Management Discharge Instructions  General Discharge Instructions :  If you need to reach your doctor call: Monday-Friday 8:00 am - 4:00 pm at [phone] or toll free [phone].  After clinic hours [phone] to have operator reach doctor.  Bring all of your medication bottles to all your appointments in the pain clinic.  To cancel or reschedule your appointment with Pain Management please remember to call 24 hours in advance to avoid a fee.  Refer to the educational materials which you have been given on: General Risks, I had my Procedure. Discharge Instructions, Post Sedation.  Post Procedure Instructions:  The drugs you were given will stay in your system until tomorrow, so for the next 24 hours you should not drive, make any legal decisions or drink any alcoholic beverages.  You may eat anything you prefer, but it is better to start with liquids then soups and crackers, and gradually work up to solid foods.  Please notify your doctor immediately if you have any unusual bleeding, trouble breathing or pain that is not related to your normal pain.  Depending on the type of procedure that was done, some parts of your body may feel week and/or numb.  This usually clears up by tonight or the next day.  Walk with the use of an assistive device or accompanied by an adult for the 24 hours.  You may use ice on the affected area for the first 24 hours.  Put ice in a Ziploc bag and cover with a towel and place against area 15 minutes on 15 minutes off.  You may switch to heat after 24 hours.GENERAL RISKS AND COMPLICATIONS  What are the risk, side effects and possible complications? Generally speaking, most procedures are safe.  However, with any procedure there are risks, side effects, and the possibility of complications.  The risks and complications are dependent upon the sites that are lesioned, or the type of nerve Goldberg to be performed.  The closer the procedure is to the spine,  the more serious the risks are.  Great care is taken when placing the radio frequency needles, Elsberry needles or lesioning probes, but sometimes complications can occur. 1. Infection: Any time there is an injection through the skin, there is a risk of infection.  This is why sterile conditions are used for these blocks.  There are four possible types of infection. 1. Localized skin infection. 2. Central Nervous System Infection-This can be in the form of Meningitis, which can be deadly. 3. Epidural Infections-This can be in the form of an epidural abscess, which can cause pressure inside of the spine, causing compression of the spinal cord with subsequent paralysis. This would require an emergency surgery to decompress, and there are no guarantees that the patient would recover from the paralysis. 4. Discitis-This is an infection of the intervertebral discs.  It occurs in about 1% of discography procedures.  It is difficult to treat and it may lead to surgery.        2. Pain: the needles have to go through skin and soft tissues, will cause soreness.       3. Damage to internal structures:  The nerves to be lesioned may be near blood vessels or    other nerves which can be potentially damaged.       4. Bleeding: Bleeding is more common if the patient is taking blood thinners such as  aspirin, Coumadin, Ticiid, Plavix, etc., or if he/she have some genetic predisposition  such as   hemophilia. Bleeding into the spinal canal can cause compression of the spinal  cord with subsequent paralysis.  This would require an emergency surgery to  decompress and there are no guarantees that the patient would recover from the  paralysis.       5. Pneumothorax:  Puncturing of a lung is a possibility, every time a needle is introduced in  the area of the chest or upper back.  Pneumothorax refers to free air around the  collapsed lung(s), inside of the thoracic cavity (chest cavity).  Another two possible  complications  related to a similar event would include: Hemothorax and Chylothorax.   These are variations of the Pneumothorax, where instead of air around the collapsed  lung(s), you may have blood or chyle, respectively.       6. Spinal headaches: They may occur with any procedures in the area of the spine.       7. Persistent CSF (Cerebro-Spinal Fluid) leakage: This is a rare problem, but may occur  with prolonged intrathecal or epidural catheters either due to the formation of a fistulous  track or a dural tear.       8. Nerve damage: By working so close to the spinal cord, there is always a possibility of  nerve damage, which could be as serious as a permanent spinal cord injury with  paralysis.       9. Death:  Although rare, severe deadly allergic reactions known as "Anaphylactic  reaction" can occur to any of the medications used.      10. Worsening of the symptoms:  We can always make thing worse.  What are the chances of something like this happening? Chances of any of this occuring are extremely low.  By statistics, you have more of a chance of getting killed in a motor vehicle accident: while driving to the hospital than any of the above occurring .  Nevertheless, you should be aware that they are possibilities.  In general, it is similar to taking a shower.  Everybody knows that you can slip, hit your head and get killed.  Does that mean that you should not shower again?  Nevertheless always keep in mind that statistics do not mean anything if you happen to be on the wrong side of them.  Even if a procedure has a 1 (one) in a 1,000,000 (million) chance of going wrong, it you happen to be that one..Also, keep in mind that by statistics, you have more of a chance of having something go wrong when taking medications.  Who should not have this procedure? If you are on a blood thinning medication (e.g. Coumadin, Plavix, see list of "Blood Thinners"), or if you have an active infection going on, you should not  have the procedure.  If you are taking any blood thinners, please inform your physician.  How should I prepare for this procedure?  Do not eat or drink anything at least six hours prior to the procedure.  Bring a driver with you .  It cannot be a taxi.  Come accompanied by an adult that can drive you back, and that is strong enough to help you if your legs get weak or numb from the local anesthetic.  Take all of your medicines the morning of the procedure with just enough water to swallow them.  If you have diabetes, make sure that you are scheduled to have your procedure done first thing in the morning, whenever possible.  If you have diabetes,   take only half of your insulin dose and notify our nurse that you have done so as soon as you arrive at the clinic.  If you are diabetic, but only take blood sugar pills (oral hypoglycemic), then do not take them on the morning of your procedure.  You may take them after you have had the procedure.  Do not take aspirin or any aspirin-containing medications, at least eleven (11) days prior to the procedure.  They may prolong bleeding.  Wear loose fitting clothing that may be easy to take off and that you would not mind if it got stained with Betadine or blood.  Do not wear any jewelry or perfume  Remove any nail coloring.  It will interfere with some of our monitoring equipment.  NOTE: Remember that this is not meant to be interpreted as a complete list of all possible complications.  Unforeseen problems may occur.  BLOOD THINNERS The following drugs contain aspirin or other products, which can cause increased bleeding during surgery and should not be taken for 2 weeks prior to and 1 week after surgery.  If you should need take something for relief of minor pain, you may take acetaminophen which is found in Tylenol,m Datril, Anacin-3 and Panadol. It is not blood thinner. The products listed below are.  Do not take any of the products listed below  in addition to any listed on your instruction sheet.  A.P.C or A.P.C with Codeine Codeine Phosphate Capsules #3 Ibuprofen Ridaura  ABC compound Congesprin Imuran rimadil  Advil Cope Indocin Robaxisal  Alka-Seltzer Effervescent Pain Reliever and Antacid Coricidin or Coricidin-D  Indomethacin Rufen  Alka-Seltzer plus Cold Medicine Cosprin Ketoprofen S-A-C Tablets  Anacin Analgesic Tablets or Capsules Coumadin Korlgesic Salflex  Anacin Extra Strength Analgesic tablets or capsules CP-2 Tablets Lanoril Salicylate  Anaprox Cuprimine Capsules Levenox Salocol  Anexsia-D Dalteparin Magan Salsalate  Anodynos Darvon compound Magnesium Salicylate Sine-off  Ansaid Dasin Capsules Magsal Sodium Salicylate  Anturane Depen Capsules Marnal Soma  APF Arthritis pain formula Dewitt's Pills Measurin Stanback  Argesic Dia-Gesic Meclofenamic Sulfinpyrazone  Arthritis Bayer Timed Release Aspirin Diclofenac Meclomen Sulindac  Arthritis pain formula Anacin Dicumarol Medipren Supac  Analgesic (Safety coated) Arthralgen Diffunasal Mefanamic Suprofen  Arthritis Strength Bufferin Dihydrocodeine Mepro Compound Suprol  Arthropan liquid Dopirydamole Methcarbomol with Aspirin Synalgos  ASA tablets/Enseals Disalcid Micrainin Tagament  Ascriptin Doan's Midol Talwin  Ascriptin A/D Dolene Mobidin Tanderil  Ascriptin Extra Strength Dolobid Moblgesic Ticlid  Ascriptin with Codeine Doloprin or Doloprin with Codeine Momentum Tolectin  Asperbuf Duoprin Mono-gesic Trendar  Aspergum Duradyne Motrin or Motrin IB Triminicin  Aspirin plain, buffered or enteric coated Durasal Myochrisine Trigesic  Aspirin Suppositories Easprin Nalfon Trillsate  Aspirin with Codeine Ecotrin Regular or Extra Strength Naprosyn Uracel  Atromid-S Efficin Naproxen Ursinus  Auranofin Capsules Elmiron Neocylate Vanquish  Axotal Emagrin Norgesic Verin  Azathioprine Empirin or Empirin with Codeine Normiflo Vitamin E  Azolid Emprazil Nuprin Voltaren  Bayer  Aspirin plain, buffered or children's or timed BC Tablets or powders Encaprin Orgaran Warfarin Sodium  Buff-a-Comp Enoxaparin Orudis Zorpin  Buff-a-Comp with Codeine Equegesic Os-Cal-Gesic   Buffaprin Excedrin plain, buffered or Extra Strength Oxalid   Bufferin Arthritis Strength Feldene Oxphenbutazone   Bufferin plain or Extra Strength Feldene Capsules Oxycodone with Aspirin   Bufferin with Codeine Fenoprofen Fenoprofen Pabalate or Pabalate-SF   Buffets II Flogesic Panagesic   Buffinol plain or Extra Strength Florinal or Florinal with Codeine Panwarfarin   Buf-Tabs Flurbiprofen Penicillamine   Butalbital Compound Four-way cold tablets   Penicillin   Butazolidin Fragmin Pepto-Bismol   Carbenicillin Geminisyn Percodan   Carna Arthritis Reliever Geopen Persantine   Carprofen Gold's salt Persistin   Chloramphenicol Goody's Phenylbutazone   Chloromycetin Haltrain Piroxlcam   Clmetidine heparin Plaquenil   Cllnoril Hyco-pap Ponstel   Clofibrate Hydroxy chloroquine Propoxyphen         Before stopping any of these medications, be sure to consult the physician who ordered them.  Some, such as Coumadin (Warfarin) are ordered to prevent or treat serious conditions such as "deep thrombosis", "pumonary embolisms", and other heart problems.  The amount of time that you may need off of the medication may also vary with the medication and the reason for which you were taking it.  If you are taking any of these medications, please make sure you notify your pain physician before you undergo any procedures.         Facet Joint [place], Care After Refer to this sheet in the next few weeks. These instructions provide you with information on caring for yourself after your procedure. Your health care provider may also give you more specific instructions. Your treatment has been planned according to current medical practices, but problems sometimes occur. Call your health care provider if you have any problems  or questions after your procedure. HOME CARE INSTRUCTIONS   Keep track of the amount of pain relief you feel and how long it lasts.  Limit pain medicine within the first 4-6 hours after the procedure as directed by your health care provider.  Resume taking dietary supplements and medicines as directed by your health care provider.  You may resume your regular diet.  Do not apply heat near or over the injection site(s) for 24 hours.   Do not take a bath or soak in water (such as a pool or lake) for 24 hours.  Do not drive for 24 hours unless approved by your health care provider.  Avoid strenuous activity for 24 hours.  Remove your bandages the morning after the procedure.   If the injection site is tender, applying an ice pack may relieve some tenderness. To do this:  Put ice in a bag.  Place a towel between your skin and the bag.  Leave the ice on for 15-20 minutes, 3-4 times a day.  Keep follow-up appointments as directed by your health care provider. SEEK MEDICAL CARE IF:   Your pain is not controlled by your medicines.   There is drainage from the injection site.   There is significant bleeding or swelling at the injection site.  You have diabetes and your blood sugar is above 180 mg/dL. SEEK IMMEDIATE MEDICAL CARE IF:   You develop a fever of 101F (38.3C) or greater.   You have worsening pain or swelling around the injection site.   You have red streaking around the injection site.   You develop severe pain that is not controlled by your medicines.   You develop a headache, stiff neck, nausea, or vomiting.   Your eyes become very sensitive to light.   You have weakness, paralysis, or tingling in your arms or legs that was not present before the procedure.   You develop difficulty urinating or breathing.    This information is not intended to replace advice given to you by your health care provider. Make sure you discuss any questions you  have with your health care provider.   Document Released: 02/06/2012 Document Revised: 03/12/2014 Document Reviewed: 02/06/2012 Elsevier Interactive Patient Education 2016   [employer] Facet Joint [place] The facet joints connect the bones of the spine (vertebrae). They make it possible for you to bend, twist, and make other movements with your spine. They also prevent you from overbending, overtwisting, and making other excessive movements.  A facet joint Proto is a procedure where a numbing medicine (anesthetic) is injected into a facet joint. Often, a type of anti-inflammatory medicine called a steroid is also injected. A facet joint Conkle may be done for two reasons:   Diagnosis. A facet joint Mathison may be done as a test to see whether neck or back pain is caused by a worn-down or infected facet joint. If the pain gets better after a facet joint Derasmo, it means the pain is probably coming from the facet joint. If the pain does not get better, it means the pain is probably not coming from the facet joint.   Therapy. A facet joint Moreland may be done to relieve neck or back pain caused by a facet joint. A facet joint Pottinger is only done as a therapy if the pain does not improve with medicine, exercise programs, physical therapy, and other forms of pain management. LET YOUR HEALTH CARE PROVIDER KNOW ABOUT:   Any allergies you have.   All medicines you are taking, including vitamins, herbs, eyedrops, and over-the-counter medicines and creams.   Previous problems you or members of your family have had with the use of anesthetics.   Any blood disorders you have had.   Other health problems you have. RISKS AND COMPLICATIONS Generally, having a facet joint Balsley is safe. However, as with any procedure, complications can occur. Possible complications associated with having a facet joint [place] include:   Bleeding.   Injury to a nerve near the injection site.   Pain at the injection site.    Weakness or numbness in areas controlled by nerves near the injection site.   Infection.   Temporary fluid retention.   Allergic reaction to anesthetics or medicines used during the procedure. BEFORE THE PROCEDURE   Follow your health care provider's instructions if you are taking dietary supplements or medicines. You may need to stop taking them or reduce your dosage.   Do not take any new dietary supplements or medicines without asking your health care provider first.   Follow your health care provider's instructions about eating and drinking before the procedure. You may need to stop eating and drinking several hours before the procedure.   Arrange to have an adult drive you home after the procedure. PROCEDURE  You may need to remove your clothing and dress in an open-back gown so that your health care provider can access your spine.   The procedure will be done while you are lying on an X-ray table. Most of the time you will be asked to lie on your stomach, but you may be asked to lie in a different position if an injection will be made in your neck.   Special machines will be used to monitor your oxygen levels, heart rate, and blood pressure.   If an injection will be made in your neck, an intravenous (IV) tube will be inserted into one of your veins. Fluids and medicine will flow directly into your body through the IV tube.   The area over the facet joint where the injection will be made will be cleaned with an antiseptic soap. The surrounding skin will be covered with sterile drapes.   An anesthetic will be applied to   your skin to make the injection area numb. You may feel a temporary stinging or burning sensation.   A video X-ray machine will be used to locate the joint. A contrast dye may be injected into the facet joint area to help with locating the joint.   When the joint is located, an anesthetic medicine will be injected into the joint through the  needle.   Your health care provider will ask you whether you feel pain relief. If you do feel relief, a steroid may be injected to provide pain relief for a longer period of time. If you do not feel relief or feel only partial relief, additional injections of an anesthetic may be made in other facet joints.   The needle will be removed, the skin will be cleansed, and bandages will be applied.  AFTER THE PROCEDURE   You will be observed for 15-30 minutes before being allowed to go home. Do not drive. Have an adult drive you or take a taxi or public transportation instead.   If you feel pain relief, the pain will return in several hours or days when the anesthetic wears off.   You may feel pain relief 2-14 days after the procedure. The amount of time this relief lasts varies from person to person.   It is normal to feel some tenderness over the injected area(s) for 2 days following the procedure.   If you have diabetes, you may have a temporary increase in blood sugar.   This information is not intended to replace advice given to you by your health care provider. Make sure you discuss any questions you have with your health care provider.   Document Released: 07/11/2006 Document Revised: 03/12/2014 Document Reviewed: 12/10/2011 Elsevier Interactive Patient Education 2016 [employer]  

## 2015-11-16 NOTE — Progress Notes (Signed)
Patient's Name: Sheryl Barker  MRN: [id number]  Referring Provider: Bobetta Lime, MD  DOB: 06-27-1975  PCP: Bobetta Lime, MD  DOS: 11/16/2015  Note by: Kathlen Brunswick. Dossie Arbour, MD  Service setting: Ambulatory outpatient  Specialty: Interventional Pain Management  Location: [facility] (AMB) Pain Management Facility    Patient type: Established   Primary Reason(s) for Visit: Encounter for post-procedure evaluation of chronic illness with mild to moderate exacerbation CC: Back Pain (upper and lower)  HPI  Sheryl Barker is a 40 y.o. year old, female patient, who returns today as an established patient. 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[place]); Adynamia; Chest pain; Long term current use of opiate analgesic; Long term prescription opiate use; Opiate use (30 MME/Day); Opiate dependence ([place]); Encounter for therapeutic drug level monitoring; Chronic low back pain  (Location of Primary Source of Pain) (Bilateral) (L>R); Lumbar spondylosis (L>R); Chronic lower extremity pain (Location of Secondary source of pain) (Left); Chronic lumbar radicular pain (left side) (L5 Dermatome); Myotonic dystrophy, 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 Her primarily concern today is the Back  Pain (upper and lower)  Pain Assessment: Self-Reported Pain Score: 5  Clinically the patient looks like a 2/10 Reported level is inconsistent with clinical observations. Information on the proper use of the pain score provided to the patient today. Pain Type: Chronic pain Pain Location: Back Pain Orientation: Lower, Upper Pain Descriptors / Indicators: Aching, Throbbing Pain Frequency: Constant  The patient comes into the clinics today for post-procedure evaluation on the interventional treatment done on 10/06/2015.  Date of Last Visit: 10/06/15 Service Provided on Last Visit: Procedure (Bilateral lumbar facet Erskin)  Post-Procedure Assessment  Procedure done on 10/06/2015: Diagnostic bilateral lumbar facet Yon under fluoroscopic guidance and IV sedation. Complications:   Complications experienced at the time of the procedure: None Side-effects or Adverse reactions: None reported Sedation: Sedation provided. When no sedatives are used, the analgesic levels obtained are directly associated with the effectiveness of the local anesthetics. On the other hand, when sedation is provided, the level of analgesia obtained during the initial 1 hour immediately following the intervention, is believed to be the result of a combination of factors. These factors may include, but are not limited to: 1. The effectiveness of the local anesthetics used. 2. The effects of the analgesic(s) and/or anxiolytic(s) used. 3. The degree of discomfort experienced by the patient at the time of the procedure. 4. The patients ability and reliability in recalling and recording the events. 5. The presence and influence of possible secondary gains. Results: Relief during the 1st hour after the procedure: 100 % (Ultra-Short Term Relief) Interpretation note: Analgesia during this period is likely to be Local Anesthetic and/or IV Sedative (Analgesic/Anxiolitic) related Local Anesthesia: Long-acting (4-6 hours) anesthetics  used. The analgesic levels attained during this period are directly associated to the localized infiltration of local anesthetics and therefore cary significant diagnostic value as to the etiological location or origin of the pain. Results: Relief during the next 4 to 6  hour after the procedure:100 % (Short Term Relief) Interpretation note: Complete relief confirms area to be the source of pain Long-Term Therapy: Steroids used. Results: Extended relief following procedure: 100 % (lasting a month and is only having pain intermittently .) Interpretation note: Long-term benefit would suggest an inflammatory etiology to the pain         Long-Term Benefits:  Current Relief (Now): 75%  Interpretation note: Persistent relief would suggest effective anti-inflammatory effects from steroids Interpretation of Results: Based on the results of this diagnostic injection, the patient would seem to be a good candidate for radiofrequency ablation. We have only done one diagnostic injection and therefore, we will go ahead and schedule her to come back for a second one. Should she get similar results, but no long-term benefit, we will proceed with radiofrequency ablation.  Laboratory Chemistry  Inflammation Markers Lab Results  Component Value Date   ESRSEDRATE 29 (H) 09/21/2015   CRP 4.0 (H) 09/21/2015    Renal Function Lab Results  Component Value Date   BUN 14 09/21/2015   CREATININE 0.65 09/21/2015   GFRAA >60 09/21/2015   GFRNONAA >60 09/21/2015    Hepatic Function Lab Results  Component Value Date   AST 68 (H) 09/21/2015   ALT 70 (H) 09/21/2015   ALBUMIN 3.6 09/21/2015    Electrolytes Lab Results  Component Value Date   NA 140 09/21/2015   K 3.5 09/21/2015   CL 105 09/21/2015   CALCIUM 9.9 09/21/2015   MG 2.0 09/21/2015    Pain Modulating Vitamins Lab Results  Component Value Date   25OHVITD1 14 (L) 09/21/2015   25OHVITD2 9.7 09/21/2015   25OHVITD3 4.3 09/21/2015   VITAMINB12  299 09/21/2015    Coagulation Parameters Lab Results  Component Value Date   PLT 214 06/19/2014    Cardiovascular Lab Results  Component Value Date   BNP 59 09/17/2013   HGB 14.1 06/19/2014   HCT 42.6 06/19/2014   Note: Lab results reviewed.  Recent Diagnostic Imaging  No results found.  Meds  The patient has a current medication list which includes the following prescription(s): bisacodyl, duloxetine, gabapentin, imipramine, oxycodone, oxycodone, oxycodone, and benefiber.  Current Outpatient Prescriptions on File Prior to Visit  Medication Sig  . bisacodyl (DULCOLAX) 5 MG EC tablet Take 2 tablets (10 mg total) by mouth at bedtime as needed for moderate constipation ((Hold for loose stool)).  Marland Kitchen gabapentin (NEURONTIN) 100 MG capsule Take 1-3 capsules (100-300 mg total) by mouth 4 (four) times daily. Follow titration schedule. (Patient taking differently: Take 200 mg by mouth 3 (three) times daily. Follow titration schedule.)  . imipramine (TOFRANIL) 10 MG tablet Take 1 tablet (10 mg total) by mouth daily as needed (In case of chest pain due to esphogeal spasm).  Marland Kitchen oxyCODONE (OXY IR/ROXICODONE) 5 MG immediate release tablet Take 1 tablet (5 mg total) by mouth every 6 (six) hours as needed for severe pain.  Marland Kitchen oxyCODONE (OXY IR/ROXICODONE) 5 MG immediate release tablet Take 1 tablet (5 mg total) by mouth every 6 (six) hours as needed for severe pain.  Marland Kitchen oxyCODONE (OXY IR/ROXICODONE) 5 MG immediate release tablet Take 1 tablet (5 mg total) by mouth every 6 (six) hours as needed for severe pain.  . Wheat Dextrin (BENEFIBER) POWD Stir 2 tsp. TID into 4-8 oz of any non-carbonated beverage or soft food (hot or cold)   No current facility-administered medications on file prior to visit.     ROS  Constitutional: Denies any fever  or chills Gastrointestinal: No reported hemesis, hematochezia, vomiting, or acute GI distress Musculoskeletal: Denies any acute onset joint swelling, redness,  loss of ROM, or weakness Neurological: No reported episodes of acute onset apraxia, aphasia, dysarthria, agnosia, amnesia, paralysis, loss of coordination, or loss of consciousness  Allergies  Ms. Yono is allergic to tramadol hcl; hydrocodone-acetaminophen; and morphine and related.  [place]  Medical:  Ms. Pfannenstiel  has a past medical history of Anxiety; Broken ankle; Chest pain (05/04/2014); Depression; Fibroid; Hypercholesteremia; IBS (irritable bowel syndrome); MD (muscular dystrophy) ([place]); Muscular dystrophy ([place]); and Vitamin D insufficiency (01/07/2015). Family: family history includes Arthritis in her mother; Diabetes in her mother and paternal grandmother; Drug abuse in her brother; Gout in her father; Hyperlipidemia in her mother; Hypertension in her father; Mental illness in her mother; Muscular dystrophy in her mother and sister; Seizures in her mother; Stroke in her mother. Surgical:  has a past surgical history that includes Tubal ligation. Tobacco:  reports that she has never smoked. She has never used smokeless tobacco. Alcohol:  reports that she does not drink alcohol. Drug:  reports that she does not use drugs.  Constitutional Exam  General appearance: Well nourished, well developed, and well hydrated. In no acute distress Vitals:   11/16/15 1012  BP: 117/83  Pulse: 72  Resp: 16  Temp: 98.4 F (36.9 C)  SpO2: 93%  Weight: 195 lb (88.5 kg)  Height: 5\' 6"  (1.676 m)  BMI Assessment: Estimated body mass index is 31.47 kg/m as calculated from the following:   Height as of this encounter: 5\' 6"  (1.676 m).   Weight as of this encounter: 195 lb (88.5 kg).   BMI interpretation: (30-34.9 kg/m2) = Obese (Class I): This range is associated with a 68% higher incidence of chronic pain. BMI Readings from Last 4 Encounters:  11/16/15 31.47 kg/m  10/06/15 31.47 kg/m  09/21/15 31.96 kg/m  09/19/15 31.73 kg/m   Wt Readings from Last 4 Encounters:  11/16/15 195 lb (88.5 kg)   10/06/15 195 lb (88.5 kg)  09/21/15 198 lb (89.8 kg)  09/19/15 202 lb 9.6 oz (91.9 kg)  Psych/Mental status: Alert and oriented x 3 (person, place, & time) Eyes: PERLA Respiratory: No evidence of acute respiratory distress  Cervical Spine Exam  Inspection: No masses, redness, or swelling Alignment: Symmetrical Functional ROM: ROM appears unrestricted Stability: No instability detected Muscle strength & Tone: Functionally intact Sensory: Unimpaired Palpation: Non-contributory  Upper Extremity (UE) Exam    Side: Right upper extremity  Side: Left upper extremity  Inspection: No masses, redness, swelling, or asymmetry  Inspection: No masses, redness, swelling, or asymmetry  Functional ROM: ROM appears unrestricted          Functional ROM: ROM appears unrestricted          Muscle strength & Tone: Functionally intact  Muscle strength & Tone: Functionally intact  Sensory: Unimpaired  Sensory: Unimpaired  Palpation: Non-contributory  Palpation: Non-contributory   Thoracic Spine Exam  Inspection: No masses, redness, or swelling Alignment: Symmetrical Functional ROM: ROM appears unrestricted Stability: No instability detected Sensory: Unimpaired Muscle strength & Tone: Functionally intact Palpation: Non-contributory  Lumbar Spine Exam  Inspection: No masses, redness, or swelling Alignment: Symmetrical Functional ROM: Limited ROM Stability: No instability detected Muscle strength & Tone: Functionally intact Sensory: Movement-associated pain Palpation: Complains of area being tender to palpation Provocative Tests: Lumbar Hyperextension and rotation test: Positive bilaterally for facet joint pain. Patrick's Maneuver: evaluation deferred today  Gait & Posture Assessment  Ambulation: Unassisted Gait: Relatively normal for age and body habitus Posture: WNL   Lower Extremity Exam    Side: Right lower extremity  Side: Left lower extremity  Inspection: No masses,  redness, swelling, or asymmetry  Inspection: No masses, redness, swelling, or asymmetry  Functional ROM: ROM appears unrestricted          Functional ROM: ROM appears unrestricted          Muscle strength & Tone: Functionally intact  Muscle strength & Tone: Functionally intact  Sensory: Unimpaired  Sensory: Unimpaired  Palpation: Non-contributory  Palpation: Non-contributory    Assessment & Plan  Primary Diagnosis & Pertinent Problem List: The primary encounter diagnosis was Lumbar facet hypertrophy (Bilateral) (L>R). Diagnoses of Lumbar spondylosis, unspecified spinal osteoarthritis and Chronic low back pain  (Location of Primary Source of Pain) (Bilateral) (L>R) were also pertinent to this visit.  Visit Diagnosis: 1. Lumbar facet hypertrophy (Bilateral) (L>R)   2. Lumbar spondylosis, unspecified spinal osteoarthritis   3. Chronic low back pain  (Location of Primary Source of Pain) (Bilateral) (L>R)     Problem-specific Plan(s): No problem-specific Assessment & Plan notes found for this encounter.   Plan of Care   Problem List Items Addressed This Visit      High   Chronic low back pain  (Location of Primary Source of Pain) (Bilateral) (L>R) (Chronic)   Lumbar facet hypertrophy (Bilateral) (L>R) - Primary (Chronic)   Relevant Orders   LUMBAR FACET(MEDIAL BRANCH NERVE Rhinehart) MBNB   Lumbar spondylosis (L>R) (Chronic)    Other Visit Diagnoses   None.      Pharmacotherapy (Medications Ordered): No orders of the defined types were placed in this encounter.   Lab-work & Procedure Ordered: Orders Placed This Encounter  Procedures  . LUMBAR FACET(MEDIAL BRANCH NERVE Plasse) MBNB    Standing Status:   Future    Standing Expiration Date:   12/16/2015    Scheduling Instructions:     Side: Bilateral     Level: L2, L3, L4, L5, & S1 Medial Branch Nerve     Sedation: Patient's choice.     Timeframe: ASAA    Order Specific Question:   Where will this procedure be performed?     Answer:   [facility]    Imaging Ordered: None  Interventional Therapies: Scheduled: None at this time.    Considering:  Possible diagnostic lumbar facet Kruczek with or without follow up with radiofrequency ablation. Possible palliative lumbar epidural steroid injections.    PRN Procedures:  Left L5-S1 lumbar epidural steroid injection under fluoroscopic guidance and without sedation.  Diagnostic bilateral lumbar facet Dimmick under fluoroscopic guidance and IV sedation.        Referral(s) or Consult(s): None at this time.  Medications administered during this visit: Ms. Wassmann had no medications administered during this visit.  Requested PM Follow-up: Return for Schedule Procedure, (ASAP).  Future Appointments Date Time Provider [place]  12/19/2015 10:00 AM Valerie Roys, DO [facility] None  12/19/2015 1:00 PM Milinda Pointer, MD [facility] None    Primary Care Physician: Bobetta Lime, MD Location: [facility] Outpatient Pain Management Facility Note by: Kathlen Brunswick. Dossie Arbour, M.D, DABA, DABAPM, DABPM, DABIPP, FIPP  Pain Score Disclaimer: We use the NRS-11 scale. This is a self-reported, subjective measurement of pain severity with only modest accuracy. It is used primarily to identify changes within a particular patient. It must be understood that outpatient pain scales are significantly less accurate that those  used for research, where they can be applied under ideal controlled circumstances with minimal exposure to variables. In reality, the score is likely to be a combination of pain intensity and pain affect, where pain affect describes the degree of emotional arousal or changes in action readiness caused by the sensory experience of pain. Factors such as social and work situation, setting, emotional state, anxiety levels, expectation, and prior pain experience may influence pain perception and show large inter-individual differences that may also be  affected by time variables.  Patient instructions provided during this appointment: Patient Instructions   Pain Management Discharge Instructions  General Discharge Instructions :  If you need to reach your doctor call: Monday-Friday 8:00 am - 4:00 pm at [phone] or toll free [phone].  After clinic hours [phone] to have operator reach doctor.  Bring all of your medication bottles to all your appointments in the pain clinic.  To cancel or reschedule your appointment with Pain Management please remember to call 24 hours in advance to avoid a fee.  Refer to the educational materials which you have been given on: General Risks, I had my Procedure. Discharge Instructions, Post Sedation.  Post Procedure Instructions:  The drugs you were given will stay in your system until tomorrow, so for the next 24 hours you should not drive, make any legal decisions or drink any alcoholic beverages.  You may eat anything you prefer, but it is better to start with liquids then soups and crackers, and gradually work up to solid foods.  Please notify your doctor immediately if you have any unusual bleeding, trouble breathing or pain that is not related to your normal pain.  Depending on the type of procedure that was done, some parts of your body may feel week and/or numb.  This usually clears up by tonight or the next day.  Walk with the use of an assistive device or accompanied by an adult for the 24 hours.  You may use ice on the affected area for the first 24 hours.  Put ice in a Ziploc bag and cover with a towel and place against area 15 minutes on 15 minutes off.  You may switch to heat after 24 hours.GENERAL RISKS AND COMPLICATIONS  What are the risk, side effects and possible complications? Generally speaking, most procedures are safe.  However, with any procedure there are risks, side effects, and the possibility of complications.  The risks and complications are dependent upon the  sites that are lesioned, or the type of nerve Yates to be performed.  The closer the procedure is to the spine, the more serious the risks are.  Great care is taken when placing the radio frequency needles, Slaydon needles or lesioning probes, but sometimes complications can occur. 1. Infection: Any time there is an injection through the skin, there is a risk of infection.  This is why sterile conditions are used for these blocks.  There are four possible types of infection. 1. Localized skin infection. 2. Central Nervous System Infection-This can be in the form of Meningitis, which can be deadly. 3. Epidural Infections-This can be in the form of an epidural abscess, which can cause pressure inside of the spine, causing compression of the spinal cord with subsequent paralysis. This would require an emergency surgery to decompress, and there are no guarantees that the patient would recover from the paralysis. 4. Discitis-This is an infection of the intervertebral discs.  It occurs in about 1% of discography procedures.  It is difficult  to treat and it may lead to surgery.        2. Pain: the needles have to go through skin and soft tissues, will cause soreness.       3. Damage to internal structures:  The nerves to be lesioned may be near blood vessels or    other nerves which can be potentially damaged.       4. Bleeding: Bleeding is more common if the patient is taking blood thinners such as  aspirin, Coumadin, Ticiid, Plavix, etc., or if he/she have some genetic predisposition  such as hemophilia. Bleeding into the spinal canal can cause compression of the spinal  cord with subsequent paralysis.  This would require an emergency surgery to  decompress and there are no guarantees that the patient would recover from the  paralysis.       5. Pneumothorax:  Puncturing of a lung is a possibility, every time a needle is introduced in  the area of the chest or upper back.  Pneumothorax refers to free air around  the  collapsed lung(s), inside of the thoracic cavity (chest cavity).  Another two possible  complications related to a similar event would include: Hemothorax and Chylothorax.   These are variations of the Pneumothorax, where instead of air around the collapsed  lung(s), you may have blood or chyle, respectively.       6. Spinal headaches: They may occur with any procedures in the area of the spine.       7. Persistent CSF (Cerebro-Spinal Fluid) leakage: This is a rare problem, but may occur  with prolonged intrathecal or epidural catheters either due to the formation of a fistulous  track or a dural tear.       8. Nerve damage: By working so close to the spinal cord, there is always a possibility of  nerve damage, which could be as serious as a permanent spinal cord injury with  paralysis.       9. Death:  Although rare, severe deadly allergic reactions known as "Anaphylactic  reaction" can occur to any of the medications used.      10. Worsening of the symptoms:  We can always make thing worse.  What are the chances of something like this happening? Chances of any of this occuring are extremely low.  By statistics, you have more of a chance of getting killed in a motor vehicle accident: while driving to the hospital than any of the above occurring .  Nevertheless, you should be aware that they are possibilities.  In general, it is similar to taking a shower.  Everybody knows that you can slip, hit your head and get killed.  Does that mean that you should not shower again?  Nevertheless always keep in mind that statistics do not mean anything if you happen to be on the wrong side of them.  Even if a procedure has a 1 (one) in a 1,000,000 (million) chance of going wrong, it you happen to be that one..Also, keep in mind that by statistics, you have more of a chance of having something go wrong when taking medications.  Who should not have this procedure? If you are on a blood thinning medication (e.g.  Coumadin, Plavix, see list of "Blood Thinners"), or if you have an active infection going on, you should not have the procedure.  If you are taking any blood thinners, please inform your physician.  How should I prepare for this procedure?  Do not eat  or drink anything at least six hours prior to the procedure.  Bring a driver with you .  It cannot be a taxi.  Come accompanied by an adult that can drive you back, and that is strong enough to help you if your legs get weak or numb from the local anesthetic.  Take all of your medicines the morning of the procedure with just enough water to swallow them.  If you have diabetes, make sure that you are scheduled to have your procedure done first thing in the morning, whenever possible.  If you have diabetes, take only half of your insulin dose and notify our nurse that you have done so as soon as you arrive at the clinic.  If you are diabetic, but only take blood sugar pills (oral hypoglycemic), then do not take them on the morning of your procedure.  You may take them after you have had the procedure.  Do not take aspirin or any aspirin-containing medications, at least eleven (11) days prior to the procedure.  They may prolong bleeding.  Wear loose fitting clothing that may be easy to take off and that you would not mind if it got stained with Betadine or blood.  Do not wear any jewelry or perfume  Remove any nail coloring.  It will interfere with some of our monitoring equipment.  NOTE: Remember that this is not meant to be interpreted as a complete list of all possible complications.  Unforeseen problems may occur.  BLOOD THINNERS The following drugs contain aspirin or other products, which can cause increased bleeding during surgery and should not be taken for 2 weeks prior to and 1 week after surgery.  If you should need take something for relief of minor pain, you may take acetaminophen which is found in Tylenol,m Datril, Anacin-3 and  Panadol. It is not blood thinner. The products listed below are.  Do not take any of the products listed below in addition to any listed on your instruction sheet.  A.P.C or A.P.C with Codeine Codeine Phosphate Capsules #3 Ibuprofen Ridaura  ABC compound Congesprin Imuran rimadil  Advil Cope Indocin Robaxisal  Alka-Seltzer Effervescent Pain Reliever and Antacid Coricidin or Coricidin-D  Indomethacin Rufen  Alka-Seltzer plus Cold Medicine Cosprin Ketoprofen S-A-C Tablets  Anacin Analgesic Tablets or Capsules Coumadin Korlgesic Salflex  Anacin Extra Strength Analgesic tablets or capsules CP-2 Tablets Lanoril Salicylate  Anaprox Cuprimine Capsules Levenox Salocol  Anexsia-D Dalteparin Magan Salsalate  Anodynos Darvon compound Magnesium Salicylate Sine-off  Ansaid Dasin Capsules Magsal Sodium Salicylate  Anturane Depen Capsules Marnal Soma  APF Arthritis pain formula Dewitt's Pills Measurin Stanback  Argesic Dia-Gesic Meclofenamic Sulfinpyrazone  Arthritis Bayer Timed Release Aspirin Diclofenac Meclomen Sulindac  Arthritis pain formula Anacin Dicumarol Medipren Supac  Analgesic (Safety coated) Arthralgen Diffunasal Mefanamic Suprofen  Arthritis Strength Bufferin Dihydrocodeine Mepro Compound Suprol  Arthropan liquid Dopirydamole Methcarbomol with Aspirin Synalgos  ASA tablets/Enseals Disalcid Micrainin Tagament  Ascriptin Doan's Midol Talwin  Ascriptin A/D Dolene Mobidin Tanderil  Ascriptin Extra Strength Dolobid Moblgesic Ticlid  Ascriptin with Codeine Doloprin or Doloprin with Codeine Momentum Tolectin  Asperbuf Duoprin Mono-gesic Trendar  Aspergum Duradyne Motrin or Motrin IB Triminicin  Aspirin plain, buffered or enteric coated Durasal Myochrisine Trigesic  Aspirin Suppositories Easprin Nalfon Trillsate  Aspirin with Codeine Ecotrin Regular or Extra Strength Naprosyn Uracel  Atromid-S Efficin Naproxen Ursinus  Auranofin Capsules Elmiron Neocylate Vanquish  Axotal Emagrin Norgesic  Verin  Azathioprine Empirin or Empirin with Codeine Normiflo Vitamin E  Azolid Emprazil  Nuprin Voltaren  Bayer Aspirin plain, buffered or children's or timed BC Tablets or powders Encaprin Orgaran Warfarin Sodium  Buff-a-Comp Enoxaparin Orudis Zorpin  Buff-a-Comp with Codeine Equegesic Os-Cal-Gesic   Buffaprin Excedrin plain, buffered or Extra Strength Oxalid   Bufferin Arthritis Strength Feldene Oxphenbutazone   Bufferin plain or Extra Strength Feldene Capsules Oxycodone with Aspirin   Bufferin with Codeine Fenoprofen Fenoprofen Pabalate or Pabalate-SF   Buffets II Flogesic Panagesic   Buffinol plain or Extra Strength Florinal or Florinal with Codeine Panwarfarin   Buf-Tabs Flurbiprofen Penicillamine   Butalbital Compound Four-way cold tablets Penicillin   Butazolidin Fragmin Pepto-Bismol   Carbenicillin Geminisyn Percodan   Carna Arthritis Reliever Geopen Persantine   Carprofen Gold's salt Persistin   Chloramphenicol Goody's Phenylbutazone   Chloromycetin Haltrain Piroxlcam   Clmetidine heparin Plaquenil   Cllnoril Hyco-pap Ponstel   Clofibrate Hydroxy chloroquine Propoxyphen         Before stopping any of these medications, be sure to consult the physician who ordered them.  Some, such as Coumadin (Warfarin) are ordered to prevent or treat serious conditions such as "deep thrombosis", "pumonary embolisms", and other heart problems.  The amount of time that you may need off of the medication may also vary with the medication and the reason for which you were taking it.  If you are taking any of these medications, please make sure you notify your pain physician before you undergo any procedures.         Facet Joint Wenig, Care After Refer to this sheet in the next few weeks. These instructions provide you with information on caring for yourself after your procedure. Your health care provider may also give you more specific instructions. Your treatment has been planned according to  current medical practices, but problems sometimes occur. Call your health care provider if you have any problems or questions after your procedure. HOME CARE INSTRUCTIONS   Keep track of the amount of pain relief you feel and how long it lasts.  Limit pain medicine within the first 4-6 hours after the procedure as directed by your health care provider.  Resume taking dietary supplements and medicines as directed by your health care provider.  You may resume your regular diet.  Do not apply heat near or over the injection site(s) for 24 hours.   Do not take a bath or soak in water (such as a pool or lake) for 24 hours.  Do not drive for 24 hours unless approved by your health care provider.  Avoid strenuous activity for 24 hours.  Remove your bandages the morning after the procedure.   If the injection site is tender, applying an ice pack may relieve some tenderness. To do this:  Put ice in a bag.  Place a towel between your skin and the bag.  Leave the ice on for 15-20 minutes, 3-4 times a day.  Keep follow-up appointments as directed by your health care provider. SEEK MEDICAL CARE IF:   Your pain is not controlled by your medicines.   There is drainage from the injection site.   There is significant bleeding or swelling at the injection site.  You have diabetes and your blood sugar is above 180 mg/dL. SEEK IMMEDIATE MEDICAL CARE IF:   You develop a fever of 101F (38.3C) or greater.   You have worsening pain or swelling around the injection site.   You have red streaking around the injection site.   You develop severe pain that is not controlled  by your medicines.   You develop a headache, stiff neck, nausea, or vomiting.   Your eyes become very sensitive to light.   You have weakness, paralysis, or tingling in your arms or legs that was not present before the procedure.   You develop difficulty urinating or breathing.    This information is not  intended to replace advice given to you by your health care provider. Make sure you discuss any questions you have with your health care provider.   Document Released: 02/06/2012 Document Revised: 03/12/2014 Document Reviewed: 02/06/2012 Elsevier Interactive Patient Education 2016 [employer] Facet Joint [place]nell The facet joints connect the bones of the spine (vertebrae). They make it possible for you to bend, twist, and make other movements with your spine. They also prevent you from overbending, overtwisting, and making other excessive movements.  A facet joint Vanderwall is a procedure where a numbing medicine (anesthetic) is injected into a facet joint. Often, a type of anti-inflammatory medicine called a steroid is also injected. A facet joint Vandivier may be done for two reasons:   Diagnosis. A facet joint Zurcher may be done as a test to see whether neck or back pain is caused by a worn-down or infected facet joint. If the pain gets better after a facet joint Luten, it means the pain is probably coming from the facet joint. If the pain does not get better, it means the pain is probably not coming from the facet joint.   Therapy. A facet joint Hickson may be done to relieve neck or back pain caused by a facet joint. A facet joint Koren is only done as a therapy if the pain does not improve with medicine, exercise programs, physical therapy, and other forms of pain management. LET [facility] CARE PROVIDER KNOW ABOUT:   Any allergies you have.   All medicines you are taking, including vitamins, herbs, eyedrops, and over-the-counter medicines and creams.   Previous problems you or members of your family have had with the use of anesthetics.   Any blood disorders you have had.   Other health problems you have. RISKS AND COMPLICATIONS Generally, having a facet joint Stapp is safe. However, as with any procedure, complications can occur. Possible complications associated with having a facet joint  Span include:   Bleeding.   Injury to a nerve near the injection site.   Pain at the injection site.   Weakness or numbness in areas controlled by nerves near the injection site.   Infection.   Temporary fluid retention.   Allergic reaction to anesthetics or medicines used during the procedure. BEFORE THE PROCEDURE   Follow your health care provider's instructions if you are taking dietary supplements or medicines. You may need to stop taking them or reduce your dosage.   Do not take any new dietary supplements or medicines without asking your health care provider first.   Follow your health care provider's instructions about eating and drinking before the procedure. You may need to stop eating and drinking several hours before the procedure.   Arrange to have an adult drive you home after the procedure. PROCEDURE  You may need to remove your clothing and dress in an open-back gown so that your health care provider can access your spine.   The procedure will be done while you are lying on an X-ray table. Most of the time you will be asked to lie on your stomach, but you may be asked to lie in a different position  if an injection will be made in your neck.   Special machines will be used to monitor your oxygen levels, heart rate, and blood pressure.   If an injection will be made in your neck, an intravenous (IV) tube will be inserted into one of your veins. Fluids and medicine will flow directly into your body through the IV tube.   The area over the facet joint where the injection will be made will be cleaned with an antiseptic soap. The surrounding skin will be covered with sterile drapes.   An anesthetic will be applied to your skin to make the injection area numb. You may feel a temporary stinging or burning sensation.   A video X-ray machine will be used to locate the joint. A contrast dye may be injected into the facet joint area to help with locating the  joint.   When the joint is located, an anesthetic medicine will be injected into the joint through the needle.   Your health care provider will ask you whether you feel pain relief. If you do feel relief, a steroid may be injected to provide pain relief for a longer period of time. If you do not feel relief or feel only partial relief, additional injections of an anesthetic may be made in other facet joints.   The needle will be removed, the skin will be cleansed, and bandages will be applied.  AFTER THE PROCEDURE   You will be observed for 15-30 minutes before being allowed to go home. Do not drive. Have an adult drive you or take a taxi or public transportation instead.   If you feel pain relief, the pain will return in several hours or days when the anesthetic wears off.   You may feel pain relief 2-14 days after the procedure. The amount of time this relief lasts varies from person to person.   It is normal to feel some tenderness over the injected area(s) for 2 days following the procedure.   If you have diabetes, you may have a temporary increase in blood sugar.   This information is not intended to replace advice given to you by your health care provider. Make sure you discuss any questions you have with your health care provider.   Document Released: 07/11/2006 Document Revised: 03/12/2014 Document Reviewed: 12/10/2011 Elsevier Interactive Patient Education [employer].

## 2015-11-29 ENCOUNTER — Encounter (INDEPENDENT_AMBULATORY_CARE_PROVIDER_SITE_OTHER): Payer: Self-pay

## 2015-12-19 ENCOUNTER — Encounter: Payer: PPO | Admitting: Family Medicine

## 2015-12-19 ENCOUNTER — Ambulatory Visit: Payer: PPO | Attending: Pain Medicine | Admitting: Pain Medicine

## 2015-12-19 ENCOUNTER — Encounter: Payer: Self-pay | Admitting: Pain Medicine

## 2015-12-19 VITALS — BP 124/86 | HR 81 | Temp 98.8°F | Resp 93 | Ht 66.5 in | Wt 195.0 lb

## 2015-12-19 DIAGNOSIS — Z833 Family history of diabetes mellitus: Secondary | ICD-10-CM | POA: Insufficient documentation

## 2015-12-19 DIAGNOSIS — G7111 Myotonic muscular dystrophy: Secondary | ICD-10-CM | POA: Insufficient documentation

## 2015-12-19 DIAGNOSIS — E669 Obesity, unspecified: Secondary | ICD-10-CM | POA: Insufficient documentation

## 2015-12-19 DIAGNOSIS — E785 Hyperlipidemia, unspecified: Secondary | ICD-10-CM | POA: Diagnosis not present

## 2015-12-19 DIAGNOSIS — G8929 Other chronic pain: Secondary | ICD-10-CM

## 2015-12-19 DIAGNOSIS — G894 Chronic pain syndrome: Secondary | ICD-10-CM | POA: Insufficient documentation

## 2015-12-19 DIAGNOSIS — M545 Low back pain, unspecified: Secondary | ICD-10-CM

## 2015-12-19 DIAGNOSIS — K5903 Drug induced constipation: Secondary | ICD-10-CM

## 2015-12-19 DIAGNOSIS — M4726 Other spondylosis with radiculopathy, lumbar region: Secondary | ICD-10-CM | POA: Insufficient documentation

## 2015-12-19 DIAGNOSIS — M792 Neuralgia and neuritis, unspecified: Secondary | ICD-10-CM

## 2015-12-19 DIAGNOSIS — Z885 Allergy status to narcotic agent status: Secondary | ICD-10-CM | POA: Diagnosis not present

## 2015-12-19 DIAGNOSIS — Z823 Family history of stroke: Secondary | ICD-10-CM | POA: Insufficient documentation

## 2015-12-19 DIAGNOSIS — K5900 Constipation, unspecified: Secondary | ICD-10-CM | POA: Insufficient documentation

## 2015-12-19 DIAGNOSIS — E559 Vitamin D deficiency, unspecified: Secondary | ICD-10-CM | POA: Diagnosis not present

## 2015-12-19 DIAGNOSIS — X58XXXA Exposure to other specified factors, initial encounter: Secondary | ICD-10-CM | POA: Diagnosis not present

## 2015-12-19 DIAGNOSIS — Z79891 Long term (current) use of opiate analgesic: Secondary | ICD-10-CM | POA: Diagnosis not present

## 2015-12-19 DIAGNOSIS — T402X5A Adverse effect of other opioids, initial encounter: Secondary | ICD-10-CM | POA: Diagnosis not present

## 2015-12-19 DIAGNOSIS — Z6831 Body mass index (BMI) 31.0-31.9, adult: Secondary | ICD-10-CM | POA: Insufficient documentation

## 2015-12-19 DIAGNOSIS — M79601 Pain in right arm: Secondary | ICD-10-CM | POA: Insufficient documentation

## 2015-12-19 DIAGNOSIS — K224 Dyskinesia of esophagus: Secondary | ICD-10-CM | POA: Diagnosis not present

## 2015-12-19 DIAGNOSIS — Z8249 Family history of ischemic heart disease and other diseases of the circulatory system: Secondary | ICD-10-CM | POA: Insufficient documentation

## 2015-12-19 DIAGNOSIS — M47896 Other spondylosis, lumbar region: Secondary | ICD-10-CM

## 2015-12-19 DIAGNOSIS — F1190 Opioid use, unspecified, uncomplicated: Secondary | ICD-10-CM

## 2015-12-19 DIAGNOSIS — M47816 Spondylosis without myelopathy or radiculopathy, lumbar region: Secondary | ICD-10-CM

## 2015-12-19 MED ORDER — OXYCODONE HCL 5 MG PO TABS: 5.0000 mg | ORAL_TABLET | Freq: Four times a day (QID) | ORAL | 0 refills | Status: DC | PRN

## 2015-12-19 MED ORDER — NEURONTIN 300 MG PO CAPS: ORAL_CAPSULE | ORAL | 2 refills | Status: AC

## 2015-12-19 MED ORDER — BENEFIBER PO POWD: ORAL | 99 refills | Status: DC

## 2015-12-19 MED ORDER — BISACODYL 5 MG PO TBEC: 10.0000 mg | DELAYED_RELEASE_TABLET | Freq: Every evening | ORAL | 0 refills | Status: AC | PRN

## 2015-12-19 NOTE — Progress Notes (Signed)
Patient's Name: Sheryl Barker  MRN: [id number]  Referring Provider: Bobetta Lime, MD  DOB: April 17, 1975  PCP: Bobetta Lime, MD  DOS: 12/19/2015  Note by: Kathlen Brunswick. Dossie Arbour, MD  Service setting: Ambulatory outpatient  Specialty: Interventional Pain Management  Location: [facility] (AMB) Pain Management Facility    Patient type: Established   Primary Reason(s) for Visit: Encounter for prescription drug management (Level of risk: moderate) CC: Back Pain (middle)  HPI  Sheryl Barker is a 40 y.o. year old, female patient, who comes today for an initial evaluation. 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[place]); [place]ynamia; Chest pain; Long term current use of opiate analgesic; Long term prescription opiate use; Opiate use (30 MME/Day); Opiate dependence ([place]); Encounter for therapeutic drug level monitoring; Chronic low back pain  (Location of Primary Source of Pain) (Bilateral) (L>R); Lumbar spondylosis (L>R); Chronic lower extremity pain (Location of Secondary source of pain) (Left); Chronic lumbar radicular pain (left side) (L5 Dermatome); Myotonic dystrophy, 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 Her primarily concern today is the Back Pain (middle)  Pain  Assessment: Self-Reported Pain Score: 7 /10 Clinically the patient looks like a 2/10 Reported level is inconsistent with clinical observations. Information on the proper use of the pain score provided to the patient today. Pain Type: Chronic pain Pain Location: Back Pain Orientation: Mid Pain Descriptors / Indicators: Constant (ms ) Pain Frequency: Intermittent (back pain comes and goes )  Sheryl Barker was last seen on 11/16/2015 for medication management. During today's appointment we reviewed Sheryl Barker's chronic pain status, as well as her outpatient medication regimen. Assessment details and plan are as follows.  The patient  reports that she does not use drugs. Her body mass index is 31 kg/m.  Controlled Substance Pharmacotherapy Assessment REMS (Risk Evaluation and Mitigation Strategy)  Analgesic: Oxycodone IR 5 mg every 6 hours (20 mg per day) MME/day: 30 mg/day Pill Count: Oxycodone  fillrd 12/05/15 62/120. Pharmacokinetics: Onset of action (Liberation/Absorption): Within expected pharmacological parameters Time to Peak effect (Distribution): Timing and results are as within normal expected parameters Duration of action (Metabolism/Excretion): Within normal limits for medication Pharmacodynamics: Analgesic Effect: More than 50% Activity Facilitation: Medication(s) allow patient to sit, stand, walk, and do the basic ADLs Perceived Effectiveness: Described as relatively effective, allowing for increase in activities of daily living (ADL) Side-effects or Adverse reactions: None reported Monitoring: [place] PMP: Online review of the past 50-month period conducted. Compliant with practice rules and regulations List of all UDS test(s) done:  Lab Results  Component Value Date   TOXASSSELUR FINAL 09/21/2015   TOXASSSELUR FINAL 06/29/2015   TOXASSSELUR FINAL 04/04/2015   TOXASSSELUR FINAL 01/07/2015   Last UDS on record: ToxAssure Select 13  Date Value Ref Range Status  09/21/2015 FINAL   Final    Comment:    ==================================================================== TOXASSURE SELECT 13 (MW) ==================================================================== Test  Result       Flag       Units Drug Present and Declared for Prescription Verification   Oxycodone                      1060         EXPECTED   ng/mg creat   Oxymorphone                    3430         EXPECTED   ng/mg creat   Noroxycodone                   763          EXPECTED   ng/mg creat   Noroxymorphone                 553          EXPECTED   ng/mg creat    Sources of oxycodone are scheduled prescription medications.    Oxymorphone, noroxycodone, and noroxymorphone are expected    metabolites of oxycodone. Oxymorphone is also available as a    scheduled prescription medication. ==================================================================== Test                      Result    Flag   Units      Ref Range   Creatinine              131              mg/dL      >=20 ==================================================================== Declared Medications:  The flagging and interpretation on this report are based on the  following declared medications.  Unexpected results may arise from  inaccuracies in the declared medications.  **Note: The testing scope of this panel includes these medications:  Oxycodone  **Note: The testing scope of this panel does not include following  reported medications:  Gabapentin (Neurontin) ==================================================================== For clinical consultation, please call [phone]. ====================================================================    UDS interpretation: Compliant          Medication Assessment Form: Reviewed. Patient indicates being compliant with therapy Treatment compliance: Compliant Risk Assessment Profile: Aberrant/High Risk Behavior: None observed or detected today Risk  Factors for Fatal Opioid Overdose: None new ones identified today Substance Use Disorder (SUD) Risk Level: Low Opioid Risk Tool (ORT) Total Score: 4 ORT Score Interpretation:  Score <3 = Low Risk for SUD  Score between 4-7 = Moderate Risk for SUD  Score >8 = High Risk for Opioid Abuse  Risk Mitigation Strategies:  Patient Counseling:  Covered Patient-Prescriber Agreement (PPA): Present and active  Notification to other healthcare providers: Done  Pharmacologic Plan: No change in therapy, at this time  Laboratory Chemistry  Inflammation Markers Lab Results  Component Value Date   ESRSEDRATE 29 (H) 09/21/2015   CRP 4.0 (H) 09/21/2015   Renal Function Lab Results  Component Value Date   BUN 14 09/21/2015   CREATININE 0.65 09/21/2015   GFRAA >60 09/21/2015   GFRNONAA >60 09/21/2015   Hepatic Function Lab Results  Component Value Date   AST 68 (H) 09/21/2015   ALT 70 (H) 09/21/2015   ALBUMIN 3.6 09/21/2015   Electrolytes Lab Results  Component Value Date   NA 140 09/21/2015   K 3.5 09/21/2015   CL 105 09/21/2015   CALCIUM 9.9 09/21/2015   MG 2.0 09/21/2015   Pain Modulating Vitamins Lab Results  Component Value  Date   25OHVITD1 14 (L) 09/21/2015   25OHVITD2 9.7 09/21/2015   25OHVITD3 4.3 09/21/2015   VITAMINB12 299 09/21/2015   Coagulation Parameters Lab Results  Component Value Date   PLT 214 06/19/2014   Cardiovascular Lab Results  Component Value Date   BNP 59 09/17/2013   HGB 14.1 06/19/2014   HCT 42.6 06/19/2014   Note: Lab results reviewed.  Recent Diagnostic Imaging  No results found. Meds  The patient has a current medication list which includes the following prescription(s): bisacodyl, duloxetine, imipramine, neurontin, oxycodone, oxycodone, oxycodone, and benefiber.  Current Outpatient Prescriptions on File Prior to Visit  Medication Sig  . DULoxetine (CYMBALTA) 20 MG capsule Take 20 mg by mouth 2 (two) times daily.  Marland Kitchen imipramine  (TOFRANIL) 10 MG tablet Take 1 tablet (10 mg total) by mouth daily as needed (In case of chest pain due to esphogeal spasm).   No current facility-administered medications on file prior to visit.    ROS  Constitutional: Denies any fever or chills Gastrointestinal: No reported hemesis, hematochezia, vomiting, or acute GI distress Musculoskeletal: Denies any acute onset joint swelling, redness, loss of ROM, or weakness Neurological: No reported episodes of acute onset apraxia, aphasia, dysarthria, agnosia, amnesia, paralysis, loss of coordination, or loss of consciousness  Allergies  Ms. Gavin is allergic to tramadol hcl; hydrocodone-acetaminophen; and morphine and related.  [place]  Medical:  Ms. Lisanti  has a past medical history of Anxiety; Broken ankle; Chest pain (05/04/2014); Depression; Fibroid; Hypercholesteremia; IBS (irritable bowel syndrome); MD (muscular dystrophy) ([place]); Muscular dystrophy ([place]); and Vitamin D insufficiency (01/07/2015). Family: family history includes Arthritis in her mother; Diabetes in her mother and paternal grandmother; Drug abuse in her brother; Gout in her father; Hyperlipidemia in her mother; Hypertension in her father; Mental illness in her mother; Muscular dystrophy in her mother and sister; Seizures in her mother; Stroke in her mother. Surgical:  has a past surgical history that includes Tubal ligation. Tobacco:  reports that she has never smoked. She has never used smokeless tobacco. Alcohol:  reports that she does not drink alcohol. Drug:  reports that she does not use drugs.  Constitutional Exam  General appearance: Well nourished, well developed, and well hydrated. In no acute distress Vitals:   12/19/15 1253  BP: 124/86  Pulse: 81  Resp: (!) 93  Temp: 98.8 F (37.1 C)  TempSrc: Oral  SpO2: (!) 17%  Weight: 195 lb (88.5 kg)  Height: 5' 6.5" (1.689 m)  BMI Assessment: Estimated body mass index is 31 kg/m as calculated from the following:    Height as of this encounter: 5' 6.5" (1.689 m).   Weight as of this encounter: 195 lb (88.5 kg).   BMI interpretation: (30-34.9 kg/m2) = Obese (Class I): This range is associated with a 68% higher incidence of chronic pain. BMI Readings from Last 4 Encounters:  12/19/15 31.00 kg/m  11/16/15 31.47 kg/m  10/06/15 31.47 kg/m  09/21/15 31.96 kg/m   Wt Readings from Last 4 Encounters:  12/19/15 195 lb (88.5 kg)  11/16/15 195 lb (88.5 kg)  10/06/15 195 lb (88.5 kg)  09/21/15 198 lb (89.8 kg)  Psych/Mental status: Alert and oriented x 3 (person, place, & time) Eyes: PERLA Respiratory: No evidence of acute respiratory distress  Cervical Spine Exam  Inspection: No masses, redness, or swelling Alignment: Symmetrical Functional ROM: Unrestricted ROM Stability: No instability detected Muscle strength & Tone: Functionally intact Sensory: Unimpaired Palpation: Non-contributory  Upper Extremity (UE) Exam    Side:  Right upper extremity  Side: Left upper extremity  Inspection: No masses, redness, swelling, or asymmetry  Inspection: No masses, redness, swelling, or asymmetry  Functional ROM: Unrestricted ROM         Functional ROM: Unrestricted ROM          Muscle strength & Tone: Functionally intact  Muscle strength & Tone: Functionally intact  Sensory: Unimpaired  Sensory: Unimpaired  Palpation: Non-contributory  Palpation: Non-contributory   Thoracic Spine Exam  Inspection: No masses, redness, or swelling Alignment: Symmetrical Functional ROM: Unrestricted ROM Stability: No instability detected Sensory: Unimpaired Muscle strength & Tone: Functionally intact Palpation: Non-contributory  Lumbar Spine Exam  Inspection: No masses, redness, or swelling Alignment: Symmetrical Functional ROM: Decreased ROM Stability: No instability detected Muscle strength & Tone: Functionally intact Sensory: Movement-associated pain Palpation: Complains of area being tender to palpation Provocative  Tests: Lumbar Hyperextension and rotation test: Positive bilaterally for facet joint pain. Patrick's Maneuver: evaluation deferred today              Gait & Posture Assessment  Ambulation: Unassisted Gait: Relatively normal for age and body habitus Posture: WNL   Lower Extremity Exam    Side: Right lower extremity  Side: Left lower extremity  Inspection: No masses, redness, swelling, or asymmetry  Inspection: No masses, redness, swelling, or asymmetry  Functional ROM: Unrestricted ROM          Functional ROM: Unrestricted ROM          Muscle strength & Tone: Functionally intact  Muscle strength & Tone: Functionally intact  Sensory: Unimpaired  Sensory: Unimpaired  Palpation: Non-contributory  Palpation: Non-contributory   Assessment  Primary Diagnosis & Pertinent Problem List: The primary encounter diagnosis was Long term prescription opiate use. Diagnoses of Chronic pain syndrome, Neurogenic pain, Opioid-induced constipation (OIC), Opiate use (30 MME/Day), Chronic low back pain  (Location of Primary Source of Pain) (Bilateral) (L>R), Lumbar facet hypertrophy (Bilateral) (L>R), and Lumbar spondylosis (L>R) were also pertinent to this visit.  Visit Diagnosis: 1. Long term prescription opiate use   2. Chronic pain syndrome   3. Neurogenic pain   4. Opioid-induced constipation (OIC)   5. Opiate use (30 MME/Day)   6. Chronic low back pain  (Location of Primary Source of Pain) (Bilateral) (L>R)   7. Lumbar facet hypertrophy (Bilateral) (L>R)   8. Lumbar spondylosis (L>R)    Plan of Care  Pharmacotherapy (Medications Ordered): Meds ordered this encounter  Medications  . oxyCODONE (OXY IR/ROXICODONE) 5 MG immediate release tablet    Sig: Take 1 tablet (5 mg total) by mouth every 6 (six) hours as needed for severe pain.    Dispense:  120 tablet    Refill:  0    Do not add this medication to the electronic "Automatic Refill" notification system. Patient may have prescription filled one  day early if pharmacy is closed on scheduled refill date. Do not fill until: 01/01/16 To last until: 01/31/16  . oxyCODONE (OXY IR/ROXICODONE) 5 MG immediate release tablet    Sig: Take 1 tablet (5 mg total) by mouth every 6 (six) hours as needed for severe pain.    Dispense:  120 tablet    Refill:  0    Do not add this medication to the electronic "Automatic Refill" notification system. Patient may have prescription filled one day early if pharmacy is closed on scheduled refill date. Do not fill until: 01/31/16 To last until: 03/01/16  . oxyCODONE (OXY IR/ROXICODONE) 5 MG immediate release tablet  Sig: Take 1 tablet (5 mg total) by mouth every 6 (six) hours as needed for severe pain.    Dispense:  120 tablet    Refill:  0    Do not add this medication to the electronic "Automatic Refill" notification system. Patient may have prescription filled one day early if pharmacy is closed on scheduled refill date. Do not fill until: 03/01/16 To last until: 03/31/16  . bisacodyl (DULCOLAX) 5 MG EC tablet    Sig: Take 2 tablets (10 mg total) by mouth at bedtime as needed for moderate constipation ((Hold for loose stool)).    Dispense:  100 tablet    Refill:  0    Do not place this medication, or any other prescription from our practice, on "Automatic Refill". Patient may have prescription filled one day early if pharmacy is closed on scheduled refill date.  . Wheat Dextrin (BENEFIBER) POWD    Sig: Stir 2 tsp. TID into 4-8 oz of any non-carbonated beverage or soft food (hot or cold)    Dispense:  500 g    Refill:  PRN    This is an OTC product. This prescription is to serve as a reminder to the patient as to our preference.  Marland Kitchen NEURONTIN 300 MG capsule    Sig: Take 1 capsule (300 mg total) by mouth 3 (three) times daily. In addition take 1-3 capsules at bedtime.    Dispense:  180 capsule    Refill:  2    Do not place this medication, or any other prescription from our practice, on "Automatic  Refill". Patient may have prescription filled one day early if pharmacy is closed on scheduled refill date.   New Prescriptions   NEURONTIN 300 MG CAPSULE    Take 1 capsule (300 mg total) by mouth 3 (three) times daily. In addition take 1-3 capsules at bedtime.   Medications administered during this visit: Ms. Nur had no medications administered during this visit. Lab-work, Procedure(s), & Referral(s) Ordered: Orders Placed This Encounter  Procedures  . LUMBAR FACET(MEDIAL BRANCH NERVE [place]) MBNB   Imaging & Referral(s) Ordered: None  Interventional Therapies: Pending/Scheduled/Planned:   Diagnostic bilateral lumbar facet Ban[place] #2 under fluoroscopic guidance and IV sedation.   Considering:   Possible diagnostic lumbar facet [place] with or without follow up with radiofrequency ablation. Possible palliative lumbar epidural steroid injections.    PRN Procedures:   Left L5-S1 lumbar epidural steroid injection under fluoroscopic guidance and without sedation. Diagnostic bilateral lumbar facet Brenneman under fluoroscopic guidance and IV sedation.   Requested PM Follow-up: Return in about 3 months (around 03/20/2016) for Med-Mgmt, In addition, Schedule Procedure, (ASAP).  Future Appointments Date Time Provider [place]  03/20/2016 8:30 AM Milinda Pointer, MD [facility] None   Primary Care Physician: Bobetta Lime, MD Location: [facility] Outpatient Pain Management Facility Note by: Kathlen Brunswick. Dossie Arbour, M.D, DABA, DABAPM, DABPM, DABIPP, FIPP  Pain Score Disclaimer: We use the NRS-11 scale. This is a self-reported, subjective measurement of pain severity with only modest accuracy. It is used primarily to identify changes within a particular patient. It must be understood that outpatient pain scales are significantly less accurate that those used for research, where they can be applied under ideal controlled circumstances with minimal exposure to variables. In reality, the score is  likely to be a combination of pain intensity and pain affect, where pain affect describes the degree of emotional arousal or changes in action readiness caused by the sensory experience of pain. Factors such  as social and work situation, setting, emotional state, anxiety levels, expectation, and prior pain experience may influence pain perception and show large inter-individual differences that may also be affected by time variables.  Patient instructions provided during this appointment: Patient Instructions   Radiofrequency Lesioning Radiofrequency lesioning is a procedure that is performed to relieve pain. The procedure is often used for back, neck, or arm pain. Radiofrequency lesioning involves the use of a machine that creates radio waves to make heat. During the procedure, the heat is applied to the nerve that carries the pain signal. The heat damages the nerve and interferes with the pain signal. Pain relief usually lasts for 6 months to 1 year. LET [facility] CARE PROVIDER KNOW ABOUT:  Any allergies you have.  All medicines you are taking, including vitamins, herbs, eye drops, creams, and over-the-counter medicines.  Previous problems you or members of your family have had with the use of anesthetics.  Any blood disorders you have.  Previous surgeries you have had.  Any medical conditions you have.  Whether you are pregnant or may be pregnant. RISKS AND COMPLICATIONS Generally, this is a safe procedure. However, problems may occur, including:  Pain or soreness at the injection site.  Infection at the injection site.  Damage to nerves or blood vessels. BEFORE THE PROCEDURE  Ask your health care provider about:  Changing or stopping your regular medicines. This is especially important if you are taking diabetes medicines or blood thinners.  Taking medicines such as aspirin and ibuprofen. These medicines can thin your blood. Do not take these medicines before your procedure  if your health care provider instructs you not to.  Follow instructions from your health care provider about eating or drinking restrictions.  Plan to have someone take you home after the procedure.  If you go home right after the procedure, plan to have someone with you for 24 hours. PROCEDURE  You will be given one or more of the following:  A medicine to help you relax (sedative).  A medicine to numb the area (local anesthetic).  You will be awake during the procedure. You will need to be able to talk with the health care provider during the procedure.  With the help of a type of X-ray (fluoroscopy), the health care provider will insert a radiofrequency needle into the area to be treated.  Next, a wire that carries the radio waves (electrode) will be put through the radiofrequency needle. An electrical pulse will be sent through the electrode to verify the correct nerve. You will feel a tingling sensation, and you may have muscle twitching.  Then, the tissue that is around the needle tip will be heated by an electric current that is passed using the radiofrequency machine. This will numb the nerves.  A bandage (dressing) will be put on the insertion area after the procedure is done. The procedure may vary among health care providers and hospitals. AFTER THE PROCEDURE  Your blood pressure, heart rate, breathing rate, and blood oxygen level will be monitored often until the medicines you were given have worn off.  Return to your normal activities as directed by your health care provider.   This information is not intended to replace advice given to you by your health care provider. Make sure you discuss any questions you have with your health care provider.   Document Released: 10/18/2010 Document Revised: 11/10/2014 Document Reviewed: 03/29/2014 Elsevier Interactive Patient Education 2016 [place]  What are  the risk, side effects and  possible complications? Generally speaking, most procedures are safe.  However, with any procedure there are risks, side effects, and the possibility of complications.  The risks and complications are dependent upon the sites that are lesioned, or the type of nerve Gammage to be performed.  The closer the procedure is to the spine, the more serious the risks are.  Great care is taken when placing the radio frequency needles, [place] needles or lesioning probes, but sometimes complications can occur. 1. Infection: Any time there is an injection through the skin, there is a risk of infection.  This is why sterile conditions are used for these blocks.  There are four possible types of infection. 1. Localized skin infection. 2. Central Nervous System Infection-This can be in the form of Meningitis, which can be deadly. 3. Epidural Infections-This can be in the form of an epidural abscess, which can cause pressure inside of the spine, causing compression of the spinal cord with subsequent paralysis. This would require an emergency surgery to decompress, and there are no guarantees that the patient would recover from the paralysis. 4. Discitis-This is an infection of the intervertebral discs.  It occurs in about 1% of discography procedures.  It is difficult to treat and it may lead to surgery.        2. Pain: the needles have to go through skin and soft tissues, will cause soreness.       3. Damage to internal structures:  The nerves to be lesioned may be near blood vessels or    other nerves which can be potentially damaged.       4. Bleeding: Bleeding is more common if the patient is taking blood thinners such as  aspirin, Coumadin, Ticiid, Plavix, etc., or if he/she have some genetic predisposition  such as hemophilia. Bleeding into the spinal canal can cause compression of the spinal  cord with subsequent paralysis.  This would require an emergency surgery to  decompress and there are no guarantees that the  patient would recover from the  paralysis.       5. Pneumothorax:  Puncturing of a lung is a possibility, every time a needle is introduced in  the area of the chest or upper back.  Pneumothorax refers to free air around the  collapsed lung(s), inside of the thoracic cavity (chest cavity).  Another two possible  complications related to a similar event would include: Hemothorax and Chylothorax.   These are variations of the Pneumothorax, where instead of air around the collapsed  lung(s), you may have blood or chyle, respectively.       6. Spinal headaches: They may occur with any procedures in the area of the spine.       7. Persistent CSF (Cerebro-Spinal Fluid) leakage: This is a rare problem, but may occur  with prolonged intrathecal or epidural catheters either due to the formation of a fistulous  track or a dural tear.       8. Nerve damage: By working so close to the spinal cord, there is always a possibility of  nerve damage, which could be as serious as a permanent spinal cord injury with  paralysis.       9. Death:  Although rare, severe deadly allergic reactions known as "Anaphylactic  reaction" can occur to any of the medications used.      10. Worsening of the symptoms:  We can always make thing worse.  What are the chances of  something like this happening? Chances of any of this occuring are extremely low.  By statistics, you have more of a chance of getting killed in a motor vehicle accident: while driving to the hospital than any of the above occurring .  Nevertheless, you should be aware that they are possibilities.  In general, it is similar to taking a shower.  Everybody knows that you can slip, hit your head and get killed.  Does that mean that you should not shower again?  Nevertheless always keep in mind that statistics do not mean anything if you happen to be on the wrong side of them.  Even if a procedure has a 1 (one) in a 1,000,000 (million) chance of going wrong, it you happen to  be that one..Also, keep in mind that by statistics, you have more of a chance of having something go wrong when taking medications.  Who should not have this procedure? If you are on a blood thinning medication (e.g. Coumadin, Plavix, see list of "Blood Thinners"), or if you have an active infection going on, you should not have the procedure.  If you are taking any blood thinners, please inform your physician.  How should I prepare for this procedure?  Do not eat or drink anything at least six hours prior to the procedure.  Bring a driver with you .  It cannot be a taxi.  Come accompanied by an adult that can drive you back, and that is strong enough to help you if your legs get weak or numb from the local anesthetic.  Take all of your medicines the morning of the procedure with just enough water to swallow them.  If you have diabetes, make sure that you are scheduled to have your procedure done first thing in the morning, whenever possible.  If you have diabetes, take only half of your insulin dose and notify our nurse that you have done so as soon as you arrive at the clinic.  If you are diabetic, but only take blood sugar pills (oral hypoglycemic), then do not take them on the morning of your procedure.  You may take them after you have had the procedure.  Do not take aspirin or any aspirin-containing medications, at least eleven (11) days prior to the procedure.  They may prolong bleeding.  Wear loose fitting clothing that may be easy to take off and that you would not mind if it got stained with Betadine or blood.  Do not wear any jewelry or perfume  Remove any nail coloring.  It will interfere with some of our monitoring equipment.  NOTE: Remember that this is not meant to be interpreted as a complete list of all possible complications.  Unforeseen problems may occur.  BLOOD THINNERS The following drugs contain aspirin or other products, which can cause increased bleeding  during surgery and should not be taken for 2 weeks prior to and 1 week after surgery.  If you should need take something for relief of minor pain, you may take acetaminophen which is found in Tylenol,m Datril, Anacin-3 and Panadol. It is not blood thinner. The products listed below are.  Do not take any of the products listed below in addition to any listed on your instruction sheet.  A.P.C or A.P.C with Codeine Codeine Phosphate Capsules #3 Ibuprofen Ridaura  ABC compound Congesprin Imuran rimadil  Advil Cope Indocin Robaxisal  Alka-Seltzer Effervescent Pain Reliever and Antacid Coricidin or Coricidin-D  Indomethacin Rufen  Alka-Seltzer plus Cold Medicine Cosprin  Ketoprofen S-A-C Tablets  Anacin Analgesic Tablets or Capsules Coumadin Korlgesic Salflex  Anacin Extra Strength Analgesic tablets or capsules CP-2 Tablets Lanoril Salicylate  Anaprox Cuprimine Capsules Levenox Salocol  Anexsia-D Dalteparin Magan Salsalate  Anodynos Darvon compound Magnesium Salicylate Sine-off  Ansaid Dasin Capsules Magsal Sodium Salicylate  Anturane Depen Capsules Marnal Soma  APF Arthritis pain formula Dewitt's Pills Measurin Stanback  Argesic Dia-Gesic Meclofenamic Sulfinpyrazone  Arthritis Bayer Timed Release Aspirin Diclofenac Meclomen Sulindac  Arthritis pain formula Anacin Dicumarol Medipren Supac  Analgesic (Safety coated) Arthralgen Diffunasal Mefanamic Suprofen  Arthritis Strength Bufferin Dihydrocodeine Mepro Compound Suprol  Arthropan liquid Dopirydamole Methcarbomol with Aspirin Synalgos  ASA tablets/Enseals Disalcid Micrainin Tagament  Ascriptin Doan's Midol Talwin  Ascriptin A/D Dolene Mobidin Tanderil  Ascriptin Extra Strength Dolobid Moblgesic Ticlid  Ascriptin with Codeine Doloprin or Doloprin with Codeine Momentum Tolectin  Asperbuf Duoprin Mono-gesic Trendar  Aspergum Duradyne Motrin or Motrin IB Triminicin  Aspirin plain, buffered or enteric coated Durasal Myochrisine Trigesic  Aspirin  Suppositories Easprin Nalfon Trillsate  Aspirin with Codeine Ecotrin Regular or Extra Strength Naprosyn Uracel  Atromid-S Efficin Naproxen Ursinus  Auranofin Capsules Elmiron Neocylate Vanquish  Axotal Emagrin Norgesic Verin  Azathioprine Empirin or Empirin with Codeine Normiflo Vitamin E  Azolid Emprazil Nuprin Voltaren  Bayer Aspirin plain, buffered or children's or timed BC Tablets or powders Encaprin Orgaran Warfarin Sodium  Buff-a-Comp Enoxaparin Orudis Zorpin  Buff-a-Comp with Codeine Equegesic Os-Cal-Gesic   Buffaprin Excedrin plain, buffered or Extra Strength Oxalid   Bufferin Arthritis Strength Feldene Oxphenbutazone   Bufferin plain or Extra Strength Feldene Capsules Oxycodone with Aspirin   Bufferin with Codeine Fenoprofen Fenoprofen Pabalate or Pabalate-SF   Buffets II Flogesic Panagesic   Buffinol plain or Extra Strength Florinal or Florinal with Codeine Panwarfarin   Buf-Tabs Flurbiprofen Penicillamine   Butalbital Compound Four-way cold tablets Penicillin   Butazolidin Fragmin Pepto-Bismol   Carbenicillin Geminisyn Percodan   Carna Arthritis Reliever Geopen Persantine   Carprofen Gold's salt Persistin   Chloramphenicol Goody's Phenylbutazone   Chloromycetin Haltrain Piroxlcam   Clmetidine heparin Plaquenil   Cllnoril Hyco-pap Ponstel   Clofibrate Hydroxy chloroquine Propoxyphen         Before stopping any of these medications, be sure to consult the physician who ordered them.  Some, such as Coumadin (Warfarin) are ordered to prevent or treat serious conditions such as "deep thrombosis", "pumonary embolisms", and other heart problems.  The amount of time that you may need off of the medication may also vary with the medication and the reason for which you were taking it.  If you are taking any of these medications, please make sure you notify your pain physician before you undergo any procedures.         Facet Blocks Patient Information  Description: The facets  are joints in the spine between the vertebrae.  Like any joints in the body, facets can become irritated and painful.  Arthritis can also effect the facets.  By injecting steroids and local anesthetic in and around these joints, we can temporarily Decoursey the nerve supply to them.  Steroids act directly on irritated nerves and tissues to reduce selling and inflammation which often leads to decreased pain.  Facet blocks may be done anywhere along the spine from the neck to the low back depending upon the location of your pain.   After numbing the skin with local anesthetic (like Novocaine), a small needle is passed onto the facet joints under x-ray guidance.  You may experience a  sensation of pressure while this is being done.  The entire [place] usually lasts about 15-25 minutes.   Conditions which may be treated by facet blocks:   Low back/buttock pain  Neck/shoulder pain  Certain types of headaches  Preparation for the injection:  1. Do not eat any solid food or dairy products within 8 hours of your appointment. 2. You may drink clear liquid up to 3 hours before appointment.  Clear liquids include water, black coffee, juice or soda.  No milk or cream please. 3. You may take your regular medication, including pain medications, with a sip of water before your appointment.  Diabetics should hold regular insulin (if taken separately) and take 1/2 normal NPH dose the morning of the procedure.  Carry some sugar containing items with you to your appointment. 4. A driver must accompany you and be prepared to drive you home after your procedure. 5. Bring all your current medications with you. 6. An IV may be inserted and sedation may be given at the discretion of the physician. 7. A blood pressure cuff, EKG and other monitors will often be applied during the procedure.  Some patients may need to have extra oxygen administered for a short period. 8. You will be asked to provide medical information,  including your allergies and medications, prior to the procedure.  We must know immediately if you are taking blood thinners (like Coumadin/Warfarin) or if you are allergic to IV iodine contrast (dye).  We must know if you could possible be pregnant.  Possible side-effects:   Bleeding from needle site  Infection (rare, may require surgery)  Nerve injury (rare)  Numbness & tingling (temporary)  Difficulty urinating (rare, temporary)  Spinal headache (a headache worse with upright posture)  Light-headedness (temporary)  Pain at injection site (serveral days)  Decreased blood pressure (rare, temporary)  Weakness in arm/leg (temporary)  Pressure sensation in back/neck (temporary)   Call if you experience:   Fever/chills associated with headache or increased back/neck pain  Headache worsened by an upright position  New onset, weakness or numbness of an extremity below the injection site  Hives or difficulty breathing (go to the emergency room)  Inflammation or drainage at the injection site(s)  Severe back/neck pain greater than usual  New symptoms which are concerning to you  Please note:  Although the local anesthetic injected can often make your back or neck feel good for several hours after the injection, the pain will likely return. It takes 3-7 days for steroids to work.  You may not notice any pain relief for at least one week.  If effective, we will often do a series of 2-3 injections spaced 3-6 weeks apart to maximally decrease your pain.  After the initial series, you may be a candidate for a more permanent nerve [place] of the facets.  If you have any questions, please call #[phone] [facility]

## 2015-12-19 NOTE — Progress Notes (Signed)
Safety precautions to be maintained throughout the outpatient stay will include: orient to surroundings, keep bed in low position, maintain call bell within reach at all times, provide assistance with transfer out of bed and ambulation. Pale  Oxycodone  fillrd 12/05/15 62/120

## 2015-12-19 NOTE — Patient Instructions (Signed)
Radiofrequency Lesioning Radiofrequency lesioning is a procedure that is performed to relieve pain. The procedure is often used for back, neck, or arm pain. Radiofrequency lesioning involves the use of a machine that creates radio waves to make heat. During the procedure, the heat is applied to the nerve that carries the pain signal. The heat damages the nerve and interferes with the pain signal. Pain relief usually lasts for 6 months to 1 year. LET [facility] CARE PROVIDER KNOW ABOUT:  Any allergies you have.  All medicines you are taking, including vitamins, herbs, eye drops, creams, and over-the-counter medicines.  Previous problems you or members of your family have had with the use of anesthetics.  Any blood disorders you have.  Previous surgeries you have had.  Any medical conditions you have.  Whether you are pregnant or may be pregnant. RISKS AND COMPLICATIONS Generally, this is a safe procedure. However, problems may occur, including:  Pain or soreness at the injection site.  Infection at the injection site.  Damage to nerves or blood vessels. BEFORE THE PROCEDURE  Ask your health care provider about:  Changing or stopping your regular medicines. This is especially important if you are taking diabetes medicines or blood thinners.  Taking medicines such as aspirin and ibuprofen. These medicines can thin your blood. Do not take these medicines before your procedure if your health care provider instructs you not to.  Follow instructions from your health care provider about eating or drinking restrictions.  Plan to have someone take you home after the procedure.  If you go home right after the procedure, plan to have someone with you for 24 hours. PROCEDURE  You will be given one or more of the following:  A medicine to help you relax (sedative).  A medicine to numb the area (local anesthetic).  You will be awake during the procedure. You will need to be able to  talk with the health care provider during the procedure.  With the help of a type of X-ray (fluoroscopy), the health care provider will insert a radiofrequency needle into the area to be treated.  Next, a wire that carries the radio waves (electrode) will be put through the radiofrequency needle. An electrical pulse will be sent through the electrode to verify the correct nerve. You will feel a tingling sensation, and you may have muscle twitching.  Then, the tissue that is around the needle tip will be heated by an electric current that is passed using the radiofrequency machine. This will numb the nerves.  A bandage (dressing) will be put on the insertion area after the procedure is done. The procedure may vary among health care providers and hospitals. AFTER THE PROCEDURE  Your blood pressure, heart rate, breathing rate, and blood oxygen level will be monitored often until the medicines you were given have worn off.  Return to your normal activities as directed by your health care provider.   This information is not intended to replace advice given to you by your health care provider. Make sure you discuss any questions you have with your health care provider.   Document Released: 10/18/2010 Document Revised: 11/10/2014 Document Reviewed: 03/29/2014 Elsevier Interactive Patient Education 2016 [place]  What are the risk, side effects and possible complications? Generally speaking, most procedures are safe.  However, with any procedure there are risks, side effects, and the possibility of complications.  The risks and complications are dependent upon the sites that are lesioned, or the  type of nerve [place] to be performed.  The closer the procedure is to the spine, the more serious the risks are.  Great care is taken when placing the radio frequency needles, Beightol needles or lesioning probes, but sometimes complications can occur. 1. Infection: Any time  there is an injection through the skin, there is a risk of infection.  This is why sterile conditions are used for these blocks.  There are four possible types of infection. 1. Localized skin infection. 2. Central Nervous System Infection-This can be in the form of Meningitis, which can be deadly. 3. Epidural Infections-This can be in the form of an epidural abscess, which can cause pressure inside of the spine, causing compression of the spinal cord with subsequent paralysis. This would require an emergency surgery to decompress, and there are no guarantees that the patient would recover from the paralysis. 4. Discitis-This is an infection of the intervertebral discs.  It occurs in about 1% of discography procedures.  It is difficult to treat and it may lead to surgery.        2. Pain: the needles have to go through skin and soft tissues, will cause soreness.       3. Damage to internal structures:  The nerves to be lesioned may be near blood vessels or    other nerves which can be potentially damaged.       4. Bleeding: Bleeding is more common if the patient is taking blood thinners such as  aspirin, Coumadin, Ticiid, Plavix, etc., or if he/she have some genetic predisposition  such as hemophilia. Bleeding into the spinal canal can cause compression of the spinal  cord with subsequent paralysis.  This would require an emergency surgery to  decompress and there are no guarantees that the patient would recover from the  paralysis.       5. Pneumothorax:  Puncturing of a lung is a possibility, every time a needle is introduced in  the area of the chest or upper back.  Pneumothorax refers to free air around the  collapsed lung(s), inside of the thoracic cavity (chest cavity).  Another two possible  complications related to a similar event would include: Hemothorax and Chylothorax.   These are variations of the Pneumothorax, where instead of air around the collapsed  lung(s), you may have blood or chyle,  respectively.       6. Spinal headaches: They may occur with any procedures in the area of the spine.       7. Persistent CSF (Cerebro-Spinal Fluid) leakage: This is a rare problem, but may occur  with prolonged intrathecal or epidural catheters either due to the formation of a fistulous  track or a dural tear.       8. Nerve damage: By working so close to the spinal cord, there is always a possibility of  nerve damage, which could be as serious as a permanent spinal cord injury with  paralysis.       9. Death:  Although rare, severe deadly allergic reactions known as "Anaphylactic  reaction" can occur to any of the medications used.      10. Worsening of the symptoms:  We can always make thing worse.  What are the chances of something like this happening? Chances of any of this occuring are extremely low.  By statistics, you have more of a chance of getting killed in a motor vehicle accident: while driving to the hospital than any of the above occurring .  Nevertheless, you should be aware that they are possibilities.  In general, it is similar to taking a shower.  Everybody knows that you can slip, hit your head and get killed.  Does that mean that you should not shower again?  Nevertheless always keep in mind that statistics do not mean anything if you happen to be on the wrong side of them.  Even if a procedure has a 1 (one) in a 1,000,000 (million) chance of going wrong, it you happen to be that one..Also, keep in mind that by statistics, you have more of a chance of having something go wrong when taking medications.  Who should not have this procedure? If you are on a blood thinning medication (e.g. Coumadin, Plavix, see list of "Blood Thinners"), or if you have an active infection going on, you should not have the procedure.  If you are taking any blood thinners, please inform your physician.  How should I prepare for this procedure?  Do not eat or drink anything at least six hours prior to the  procedure.  Bring a driver with you .  It cannot be a taxi.  Come accompanied by an adult that can drive you back, and that is strong enough to help you if your legs get weak or numb from the local anesthetic.  Take all of your medicines the morning of the procedure with just enough water to swallow them.  If you have diabetes, make sure that you are scheduled to have your procedure done first thing in the morning, whenever possible.  If you have diabetes, take only half of your insulin dose and notify our nurse that you have done so as soon as you arrive at the clinic.  If you are diabetic, but only take blood sugar pills (oral hypoglycemic), then do not take them on the morning of your procedure.  You may take them after you have had the procedure.  Do not take aspirin or any aspirin-containing medications, at least eleven (11) days prior to the procedure.  They may prolong bleeding.  Wear loose fitting clothing that may be easy to take off and that you would not mind if it got stained with Betadine or blood.  Do not wear any jewelry or perfume  Remove any nail coloring.  It will interfere with some of our monitoring equipment.  NOTE: Remember that this is not meant to be interpreted as a complete list of all possible complications.  Unforeseen problems may occur.  BLOOD THINNERS The following drugs contain aspirin or other products, which can cause increased bleeding during surgery and should not be taken for 2 weeks prior to and 1 week after surgery.  If you should need take something for relief of minor pain, you may take acetaminophen which is found in Tylenol,m Datril, Anacin-3 and Panadol. It is not blood thinner. The products listed below are.  Do not take any of the products listed below in addition to any listed on your instruction sheet.  A.P.C or A.P.C with Codeine Codeine Phosphate Capsules #3 Ibuprofen Ridaura  ABC compound Congesprin Imuran rimadil  Advil Cope Indocin  Robaxisal  Alka-Seltzer Effervescent Pain Reliever and Antacid Coricidin or Coricidin-D  Indomethacin Rufen  Alka-Seltzer plus Cold Medicine Cosprin Ketoprofen S-A-C Tablets  Anacin Analgesic Tablets or Capsules Coumadin Korlgesic Salflex  Anacin Extra Strength Analgesic tablets or capsules CP-2 Tablets Lanoril Salicylate  Anaprox Cuprimine Capsules Levenox Salocol  Anexsia-D Dalteparin Magan Salsalate  Anodynos Darvon compound Magnesium Salicylate Sine-off  Ansaid Dasin Capsules Magsal Sodium Salicylate  Anturane Depen Capsules Marnal Soma  APF Arthritis pain formula Dewitt's Pills Measurin Stanback  Argesic Dia-Gesic Meclofenamic Sulfinpyrazone  Arthritis Bayer Timed Release Aspirin Diclofenac Meclomen Sulindac  Arthritis pain formula Anacin Dicumarol Medipren Supac  Analgesic (Safety coated) Arthralgen Diffunasal Mefanamic Suprofen  Arthritis Strength Bufferin Dihydrocodeine Mepro Compound Suprol  Arthropan liquid Dopirydamole Methcarbomol with Aspirin Synalgos  ASA tablets/Enseals Disalcid Micrainin Tagament  Ascriptin Doan's Midol Talwin  Ascriptin A/D Dolene Mobidin Tanderil  Ascriptin Extra Strength Dolobid Moblgesic Ticlid  Ascriptin with Codeine Doloprin or Doloprin with Codeine Momentum Tolectin  Asperbuf Duoprin Mono-gesic Trendar  Aspergum Duradyne Motrin or Motrin IB Triminicin  Aspirin plain, buffered or enteric coated Durasal Myochrisine Trigesic  Aspirin Suppositories Easprin Nalfon Trillsate  Aspirin with Codeine Ecotrin Regular or Extra Strength Naprosyn Uracel  Atromid-S Efficin Naproxen Ursinus  Auranofin Capsules Elmiron Neocylate Vanquish  Axotal Emagrin Norgesic Verin  Azathioprine Empirin or Empirin with Codeine Normiflo Vitamin E  Azolid Emprazil Nuprin Voltaren  Bayer Aspirin plain, buffered or children's or timed BC Tablets or powders Encaprin Orgaran Warfarin Sodium  Buff-a-Comp Enoxaparin Orudis Zorpin  Buff-a-Comp with Codeine Equegesic Os-Cal-Gesic    Buffaprin Excedrin plain, buffered or Extra Strength Oxalid   Bufferin Arthritis Strength Feldene Oxphenbutazone   Bufferin plain or Extra Strength Feldene Capsules Oxycodone with Aspirin   Bufferin with Codeine Fenoprofen Fenoprofen Pabalate or Pabalate-SF   Buffets II Flogesic Panagesic   Buffinol plain or Extra Strength Florinal or Florinal with Codeine Panwarfarin   Buf-Tabs Flurbiprofen Penicillamine   Butalbital Compound Four-way cold tablets Penicillin   Butazolidin Fragmin Pepto-Bismol   Carbenicillin Geminisyn Percodan   Carna Arthritis Reliever Geopen Persantine   Carprofen Gold's salt Persistin   Chloramphenicol Goody's Phenylbutazone   Chloromycetin Haltrain Piroxlcam   Clmetidine heparin Plaquenil   Cllnoril Hyco-pap Ponstel   Clofibrate Hydroxy chloroquine Propoxyphen         Before stopping any of these medications, be sure to consult the physician who ordered them.  Some, such as Coumadin (Warfarin) are ordered to prevent or treat serious conditions such as "deep thrombosis", "pumonary embolisms", and other heart problems.  The amount of time that you may need off of the medication may also vary with the medication and the reason for which you were taking it.  If you are taking any of these medications, please make sure you notify your pain physician before you undergo any procedures.         Facet Blocks Patient Information  Description: The facets are joints in the spine between the vertebrae.  Like any joints in the body, facets can become irritated and painful.  Arthritis can also effect the facets.  By injecting steroids and local anesthetic in and around these joints, we can temporarily Six the nerve supply to them.  Steroids act directly on irritated nerves and tissues to reduce selling and inflammation which often leads to decreased pain.  Facet blocks may be done anywhere along the spine from the neck to the low back depending upon the location of your  pain.   After numbing the skin with local anesthetic (like Novocaine), a small needle is passed onto the facet joints under x-ray guidance.  You may experience a sensation of pressure while this is being done.  The entire Stelzer usually lasts about 15-25 minutes.   Conditions which may be treated by facet blocks:   Low back/buttock pain  Neck/shoulder pain  Certain types of headaches  Preparation for  the injection:  1. Do not eat any solid food or dairy products within 8 hours of your appointment. 2. You may drink clear liquid up to 3 hours before appointment.  Clear liquids include water, black coffee, juice or soda.  No milk or cream please. 3. You may take your regular medication, including pain medications, with a sip of water before your appointment.  Diabetics should hold regular insulin (if taken separately) and take 1/2 normal NPH dose the morning of the procedure.  Carry some sugar containing items with you to your appointment. 4. A driver must accompany you and be prepared to drive you home after your procedure. 5. Bring all your current medications with you. 6. An IV may be inserted and sedation may be given at the discretion of the physician. 7. A blood pressure cuff, EKG and other monitors will often be applied during the procedure.  Some patients may need to have extra oxygen administered for a short period. 8. You will be asked to provide medical information, including your allergies and medications, prior to the procedure.  We must know immediately if you are taking blood thinners (like Coumadin/Warfarin) or if you are allergic to IV iodine contrast (dye).  We must know if you could possible be pregnant.  Possible side-effects:   Bleeding from needle site  Infection (rare, may require surgery)  Nerve injury (rare)  Numbness & tingling (temporary)  Difficulty urinating (rare, temporary)  Spinal headache (a headache worse with upright posture)  Light-headedness  (temporary)  Pain at injection site (serveral days)  Decreased blood pressure (rare, temporary)  Weakness in arm/leg (temporary)  Pressure sensation in back/neck (temporary)   Call if you experience:   Fever/chills associated with headache or increased back/neck pain  Headache worsened by an upright position  New onset, weakness or numbness of an extremity below the injection site  Hives or difficulty breathing (go to the emergency room)  Inflammation or drainage at the injection site(s)  Severe back/neck pain greater than usual  New symptoms which are concerning to you  Please note:  Although the local anesthetic injected can often make your back or neck feel good for several hours after the injection, the pain will likely return. It takes 3-7 days for steroids to work.  You may not notice any pain relief for at least one week.  If effective, we will often do a series of 2-3 injections spaced 3-6 weeks apart to maximally decrease your pain.  After the initial series, you may be a candidate for a more permanent nerve [place] of the facets.  If you have any questions, please call #[phone]) [facility]

## 2015-12-19 NOTE — Progress Notes (Signed)
.  [id number]

## 2015-12-27 ENCOUNTER — Encounter: Payer: Self-pay | Admitting: Pain Medicine

## 2015-12-27 ENCOUNTER — Ambulatory Visit: Payer: PPO | Attending: Pain Medicine | Admitting: Pain Medicine

## 2015-12-27 ENCOUNTER — Ambulatory Visit
Admission: RE | Admit: 2015-12-27 | Discharge: 2015-12-27 | Disposition: A | Discharge status: Home / Self Care | Payer: PPO | Source: Ambulatory Visit | Attending: Pain Medicine | Admitting: Pain Medicine

## 2015-12-27 VITALS — BP 104/75 | HR 86 | Temp 98.4°F | Resp 17 | Ht 66.5 in | Wt 195.0 lb

## 2015-12-27 DIAGNOSIS — M4696 Unspecified inflammatory spondylopathy, lumbar region: Secondary | ICD-10-CM | POA: Insufficient documentation

## 2015-12-27 DIAGNOSIS — M47896 Other spondylosis, lumbar region: Secondary | ICD-10-CM | POA: Insufficient documentation

## 2015-12-27 DIAGNOSIS — M47816 Spondylosis without myelopathy or radiculopathy, lumbar region: Secondary | ICD-10-CM | POA: Insufficient documentation

## 2015-12-27 DIAGNOSIS — Z885 Allergy status to narcotic agent status: Secondary | ICD-10-CM | POA: Diagnosis not present

## 2015-12-27 DIAGNOSIS — G8929 Other chronic pain: Secondary | ICD-10-CM | POA: Diagnosis not present

## 2015-12-27 DIAGNOSIS — M1288 Other specific arthropathies, not elsewhere classified, other specified site: Secondary | ICD-10-CM | POA: Diagnosis not present

## 2015-12-27 DIAGNOSIS — M545 Low back pain: Secondary | ICD-10-CM

## 2015-12-27 MED ORDER — MIDAZOLAM HCL 5 MG/5ML IJ SOLN
INTRAMUSCULAR | Status: AC
  Administered 2015-12-27: 3 mg
  Filled 2015-12-27: qty 5

## 2015-12-27 MED ORDER — TRIAMCINOLONE ACETONIDE 40 MG/ML IJ SUSP
INTRAMUSCULAR | Status: AC
  Administered 2015-12-27: 11:00:00
  Filled 2015-12-27: qty 1

## 2015-12-27 MED ORDER — LIDOCAINE HCL (PF) 1 % IJ SOLN
INTRAMUSCULAR | Status: AC
Start: 2015-12-27 — End: 2015-12-27
  Administered 2015-12-27: 11:00:00
  Filled 2015-12-27: qty 5

## 2015-12-27 MED ORDER — ROPIVACAINE HCL 2 MG/ML IJ SOLN
INTRAMUSCULAR | Status: AC
  Administered 2015-12-27: 11:00:00
  Filled 2015-12-27: qty 20

## 2015-12-27 MED ORDER — TRIAMCINOLONE ACETONIDE 40 MG/ML IJ SUSP
INTRAMUSCULAR | Status: AC
Start: 2015-12-27 — End: 2015-12-27
  Administered 2015-12-27: 11:00:00
  Filled 2015-12-27: qty 1

## 2015-12-27 NOTE — Progress Notes (Signed)
Safety precautions to be maintained throughout the outpatient stay will include: orient to surroundings, keep bed in low position, maintain call bell within reach at all times, provide assistance with transfer out of bed and ambulation.  

## 2015-12-27 NOTE — Patient Instructions (Signed)
Please complete your post procedure diary. Pain Management Discharge Instructions  General Discharge Instructions :  If you need to reach your doctor call: Monday-Friday 8:00 am - 4:00 pm at [phone] or toll free [phone].  After clinic hours [phone] to have operator reach doctor.  Bring all of your medication bottles to all your appointments in the pain clinic.  To cancel or reschedule your appointment with Pain Management please remember to call 24 hours in advance to avoid a fee.  Refer to the educational materials which you have been given on: General Risks, I had my Procedure. Discharge Instructions, Post Sedation.  Post Procedure Instructions:  The drugs you were given will stay in your system until tomorrow, so for the next 24 hours you should not drive, make any legal decisions or drink any alcoholic beverages.  You may eat anything you prefer, but it is better to start with liquids then soups and crackers, and gradually work up to solid foods.  Please notify your doctor immediately if you have any unusual bleeding, trouble breathing or pain that is not related to your normal pain.  Depending on the type of procedure that was done, some parts of your body may feel week and/or numb.  This usually clears up by tonight or the next day.  Walk with the use of an assistive device or accompanied by an adult for the 24 hours.  You may use ice on the affected area for the first 24 hours.  Put ice in a Ziploc bag and cover with a towel and place against area 15 minutes on 15 minutes off.  You may switch to heat after 24 hours.

## 2015-12-27 NOTE — Progress Notes (Signed)
Patient's Name: Sheryl Barker  MRN: [id number]  Referring Provider: Milinda Pointer, MD  DOB: [birth year]  PCP: Bobetta Lime, MD  DOS: 12/27/2015  Note by: Kathlen Brunswick. Dossie Arbour, MD  Service setting: Ambulatory outpatient  Location: [facility] (AMB) Pain Management Facility  Visit type: Procedure  Specialty: Interventional Pain Management  Patient type: Established   Primary Reason for Visit: Interventional Pain Management Treatment. CC: Back Pain (upper)  Procedure:  Anesthesia, Analgesia, Anxiolysis:  Type: Diagnostic Medial Branch Facet [place] Region: Lumbar Level: L2, L3, L4, L5, & S1 Medial Branch Level(s) Laterality: Bilateral  Type: Local Anesthesia with Moderate (Conscious) Sedation Local Anesthetic: Lidocaine 1% Route: Intravenous (IV) IV Access: Secured Sedation: Meaningful verbal contact was maintained at all times during the procedure  Indication(s): Analgesia and Anxiety  Indications: 1. Lumbar facet hypertrophy (Bilateral) (L>R)   2. Lumbar spondylosis (L>R)   3. Lumbar facet syndrome (Bilateral) (L>R)   4. Chronic low back pain  (Location of Primary Source of Pain) (Bilateral) (L>R)    Pain Score: Pre-procedure: 4 /10 Post-procedure: 0-No pain/10  Pre-Procedure Assessment:  Sheryl Barker is a 40 y.o. (year old), female patient, seen today for interventional treatment. She  has a past surgical history that includes Tubal ligation.. Her primarily concern today is the Back Pain (upper) The primary encounter diagnosis was Lumbar facet hypertrophy (Bilateral) (L>R). Diagnoses of Lumbar spondylosis (L>R), Lumbar facet syndrome (Bilateral) (L>R), and Chronic low back pain  (Location of Primary Source of Pain) (Bilateral) (L>R) were also pertinent to this visit.  Pain Type: Chronic pain Pain Location: Back Pain Orientation: Mid Pain Descriptors / Indicators: Constant, Aching, Discomfort Pain Frequency: Intermittent  Date of Last Visit: 12/19/15 Service Provided on Last Visit:  Med Refill  Coagulation Parameters Lab Results  Component Value Date   PLT 214 06/19/2014   Verification of the correct person, correct site (including marking of site), and correct procedure were performed and confirmed by the patient.  Consent: Before the procedure and under the influence of no sedative(s), amnesic(s), or anxiolytics, the patient was informed of the treatment options, risks and possible complications. To fulfill our ethical and legal obligations, as recommended by the American Medical Association's Code of Ethics, I have informed the patient of my clinical impression; the nature and purpose of the treatment or procedure; the risks, benefits, and possible complications of the intervention; the alternatives, including doing nothing; the risk(s) and benefit(s) of the alternative treatment(s) or procedure(s); and the risk(s) and benefit(s) of doing nothing. The patient was provided information about the general risks and possible complications associated with the procedure. These may include, but are not limited to: failure to achieve desired goals, infection, bleeding, organ or nerve damage, allergic reactions, paralysis, and death. In addition, the patient was informed of those risks and complications associated to Spine-related procedures, such as failure to decrease pain; infection (i.e.: Meningitis, epidural or intraspinal abscess); bleeding (i.e.: epidural hematoma, subarachnoid hemorrhage, or any other type of intraspinal or peri-dural bleeding); organ or nerve damage (i.e.: Any type of peripheral nerve, nerve root, or spinal cord injury) with subsequent damage to sensory, motor, and/or autonomic systems, resulting in permanent pain, numbness, and/or weakness of one or several areas of the body; allergic reactions; (i.e.: anaphylactic reaction); and/or death. Furthermore, the patient was informed of those risks and complications associated with the medications. These include, but  are not limited to: allergic reactions (i.e.: anaphylactic or anaphylactoid reaction(s)); adrenal axis suppression; blood sugar elevation that in diabetics may result in ketoacidosis or  comma; water retention that in patients with history of congestive heart failure may result in shortness of breath, pulmonary edema, and decompensation with resultant heart failure; weight gain; swelling or edema; medication-induced neural toxicity; particulate matter embolism and blood vessel occlusion with resultant organ, and/or nervous system infarction; and/or aseptic necrosis of one or more joints. Finally, the patient was informed that Medicine is not an exact science; therefore, there is also the possibility of unforeseen or unpredictable risks and/or possible complications that may result in a catastrophic outcome. The patient indicated having understood very clearly. We have given the patient no guarantees and we have made no promises. Enough time was given to the patient to ask questions, all of which were answered to the patient's satisfaction. Sheryl Barker has indicated that she wanted to continue with the procedure.  Consent Attestation: I, the ordering provider, attest that I have discussed with the patient the benefits, risks, side-effects, alternatives, likelihood of achieving goals, and potential problems during recovery for the procedure that I have provided informed consent.  Pre-Procedure Preparation:  Safety Precautions: Allergies reviewed. The patient was asked about blood thinners, or active infections, both of which were denied. The patient was asked to confirm the procedure and laterality, before marking the site, and again before commencing the procedure. Appropriate site, procedure, and patient were confirmed by following the Joint Commission's Universal Protocol (UP.01.01.01), in the form of a "Time Out". The patient was asked to participate by confirming the accuracy of the "Time Out" information.  Patient was assessed for positional comfort and pressure points before starting the procedure. Allergies: She is allergic to tramadol hcl; hydrocodone-acetaminophen; and morphine and related.. Allergy Precautions: None required Infection Control Precautions: Sterile technique used. Standard Universal Precautions were taken as recommended by the Department of [facility] for Disease Control and Prevention (CDC). Standard pre-surgical skin prep was conducted. Respiratory hygiene and cough etiquette was practiced. Hand hygiene observed. Safe injection practices and needle disposal techniques followed. SDV (single dose vial) medications used. Medications properly checked for expiration dates and contaminants. Personal protective equipment (PPE) used as per protocol. Monitoring:  As per clinic protocol. Vitals:   12/27/15 1051 12/27/15 1100 12/27/15 1112 12/27/15 1119  BP: 106/83 111/83 102/82 104/75  Pulse: 93 85 85 86  Resp: 12 12 14 17   Temp:      SpO2: 93% 95% 94% 93%  Weight:      Height:      Calculated BMI: Body mass index is 31 kg/m. Time-out: "Time-out" completed before starting procedure, as per protocol.  Description of Procedure Process:   Time-out: "Time-out" completed before starting procedure, as per protocol. Position: Prone Target Area: For Lumbar Facet blocks, the target is the groove formed by the junction of the transverse process and superior articular process. For the L5 dorsal ramus, the target is the notch between superior articular process and sacral ala. For the S1 dorsal ramus, the target is the superior and lateral edge of the posterior S1 Sacral foramen. Approach: Paramedial approach. Area Prepped: Entire Posterior Lumbosacral Region Prepping solution: ChloraPrep (2% chlorhexidine gluconate and 70% isopropyl alcohol) Safety Precautions: Aspiration looking for blood return was conducted prior to all injections. At no point did we inject any substances, as a  needle was being advanced. No attempts were made at seeking any paresthesias. Safe injection practices and needle disposal techniques used. Medications properly checked for expiration dates. SDV (single dose vial) medications used. Description of the Procedure: Protocol guidelines were followed. The patient was  placed in position over the fluoroscopy table. The target area was identified and the area prepped in the usual manner. Skin desensitized using vapocoolant spray. Skin & deeper tissues infiltrated with local anesthetic. Appropriate amount of time allowed to pass for local anesthetics to take effect. The procedure needle was introduced through the skin, ipsilateral to the reported pain, and advanced to the target area. Employing the "Medial Branch Technique", the needles were advanced to the angle made by the superior and medial portion of the transverse process, and the lateral and inferior portion of the superior articulating process of the targeted vertebral bodies. This area is known as "Burton's Eye" or the "Eye of the [place] Dog". A procedure needle was introduced through the skin, and this time advanced to the angle made by the superior and medial border of the sacral ala, and the lateral border of the S1 vertebral body. This last needle was later repositioned at the superior and lateral border of the posterior S1 foramen. Negative aspiration confirmed. Solution injected in intermittent fashion, asking for systemic symptoms every 0.5cc of injectate. The needles were then removed and the area cleansed, making sure to leave some of the prepping solution back to take advantage of its long term bactericidal properties. EBL: None Materials & Medications Used:  Needle(s) Used: 22g - 3.5" Spinal Needle(s)   Illustration of the posterior view of the lumbar spine and the posterior neural structures. Laminae of L2 through S1 are labeled. DPRL5, dorsal primary ramus of L5; DPRS1, dorsal primary ramus of  S1; DPR3, dorsal primary ramus of L3; FJ, facet (zygapophyseal) joint L3-L4; I, inferior articular process of L4; LB1, lateral branch of dorsal primary ramus of L1; IAB, inferior articular branches from L3 medial branch (supplies L4-L5 facet joint); IBP, intermediate branch plexus; MB3, medial branch of dorsal primary ramus of L3; NR3, third lumbar nerve root; S, superior articular process of L5; SAB, superior articular branches from L4 (supplies L4-5 facet joint also); TP3, transverse process of L3.  Imaging Guidance (Spinal):  Type of Imaging Technique: Fluoroscopy Guidance (Spinal) Indication(s): Assistance in needle guidance and placement for procedures requiring needle placement in or near specific anatomical locations not easily accessible without such assistance. Exposure Time: Please see nurses notes. Contrast: None used. Fluoroscopic Guidance: I was personally present during the use of fluoroscopy. "Tunnel Vision Technique" used to obtain the best possible view of the target area. Parallax error corrected before commencing the procedure. "Direction-depth-direction" technique used to introduce the needle under continuous pulsed fluoroscopy. Once target was reached, antero-posterior, oblique, and lateral fluoroscopic projection used confirm needle placement in all planes. Images permanently stored in EMR. Interpretation: No contrast injected. I personally interpreted the imaging intraoperatively. Adequate needle placement confirmed in multiple planes. Permanent images saved into the patient's record.  Antibiotic Prophylaxis:  Indication(s): No indications identified. Type:  Antibiotics Given (last 72 hours)    None      Post-operative Assessment:  Complications: No immediate post-treatment complications observed by team, or reported by patient. Disposition: The patient tolerated the entire procedure well. A repeat set of vitals were taken after the procedure and the patient was kept under  observation following institutional policy, for this type of procedure. Post-procedural neurological assessment was performed, showing return to baseline, prior to discharge. The patient was provided with post-procedure discharge instructions, including a section on how to identify potential problems. Should any problems arise concerning this procedure, the patient was given instructions to immediately contact us, at any time, without hesitation. In  any case, we plan to contact the patient by telephone for a follow-up status report regarding this interventional procedure. Comments:  No additional relevant information.  Plan of Care  Discharge to: Discharge home  Medications ordered for procedure: Meds ordered this encounter  Medications  . ropivacaine (PF) 2 mg/ml (0.2%) (NAROPIN) 2 MG/ML epidural    COCHRAN, HANNAH: cabinet override  . lidocaine (PF) (XYLOCAINE) 1 % injection    COCHRAN, HANNAH: cabinet override  . triamcinolone acetonide (KENALOG-40) 40 MG/ML injection    COCHRAN, HANNAH: cabinet override  . triamcinolone acetonide (KENALOG-40) 40 MG/ML injection    COCHRAN, HANNAH: cabinet override  . midazolam (VERSED) 5 MG/5ML injection    COCHRAN, HANNAH: cabinet override   Medications administered: (For more details, see medical record) We administered ropivacaine (PF) 2 mg/ml (0.2%), lidocaine (PF), triamcinolone acetonide, triamcinolone acetonide, and midazolam.  Imaging Ordered: No results found for this or any previous visit. New Prescriptions   No medications on file   Physician-requested Follow-up:  Return in about 2 weeks (around 01/10/2016) for Post-Procedure evaluation.  Future Appointments Date Time Provider [place]  03/20/2016 8:30 AM Milinda Pointer, MD [facility] None   Primary Care Physician: Bobetta Lime, MD Location: [facility] Outpatient [facility] Management Facility Note by: Kathlen Brunswick. Dossie Arbour, M.D, DABA, DABAPM, DABPM, DABIPP, FIPP  Disclaimer:    Medicine is not an exact science. The only guarantee in medicine is that nothing is guaranteed. It is important to note that the decision to proceed with this intervention was based on the information collected from the patient. The Data and conclusions were drawn from the patient's questionnaire, the interview, and the physical examination. Because the information was provided in large part by the patient, it cannot be guaranteed that it has not been purposely or unconsciously manipulated. Every effort has been made to obtain as much relevant data as possible for this evaluation. It is important to note that the conclusions that lead to this procedure are derived in large part from the available data. Always take into account that the treatment will also be dependent on availability of resources and existing treatment guidelines, considered by other Pain Management Practitioners as being common knowledge and practice, at the time of the intervention. For Medico-Legal purposes, it is also important to point out that variation in procedural techniques and pharmacological choices are the acceptable norm. The indications, contraindications, technique, and results of the above procedure should only be interpreted and judged by a Board-Certified Interventional Pain Specialist with extensive familiarity and expertise in the same exact procedure and technique. Attempts at providing opinions without similar or greater experience and expertise than that of the treating physician will be considered as inappropriate and unethical, and shall result in a formal complaint to the state medical board and applicable specialty societies.  Instructions provided at this appointment: Patient Instructions  Please complete your post procedure diary. Pain Management Discharge Instructions  General Discharge Instructions :  If you need to reach your doctor call: Monday-Friday 8:00 am - 4:00 pm at [phone] or toll free  [phone].  After clinic hours [phone] to have operator reach doctor.  Bring all of your medication bottles to all your appointments in the pain clinic.  To cancel or reschedule your appointment with Pain Management please remember to call 24 hours in advance to avoid a fee.  Refer to the educational materials which you have been given on: General Risks, I had my Procedure. Discharge Instructions, Post Sedation.  Post Procedure Instructions:  The drugs  you were given will stay in your system until tomorrow, so for the next 24 hours you should not drive, make any legal decisions or drink any alcoholic beverages.  You may eat anything you prefer, but it is better to start with liquids then soups and crackers, and gradually work up to solid foods.  Please notify your doctor immediately if you have any unusual bleeding, trouble breathing or pain that is not related to your normal pain.  Depending on the type of procedure that was done, some parts of your body may feel week and/or numb.  This usually clears up by tonight or the next day.  Walk with the use of an assistive device or accompanied by an adult for the 24 hours.  You may use ice on the affected area for the first 24 hours.  Put ice in a Ziploc bag and cover with a towel and place against area 15 minutes on 15 minutes off.  You may switch to heat after 24 hours.

## 2015-12-28 ENCOUNTER — Telehealth: Payer: Self-pay | Admitting: *Deleted

## 2015-12-28 NOTE — Telephone Encounter (Signed)
Left voicemail with patient to call our office if there are questions or concerns re; procedure on yesterday. 

## 2015-12-29 ENCOUNTER — Telehealth: Payer: Self-pay | Admitting: *Deleted

## 2016-01-15 ENCOUNTER — Encounter: Payer: Self-pay | Admitting: Emergency Medicine

## 2016-01-15 ENCOUNTER — Emergency Department
Admission: EM | Admit: 2016-01-15 | Discharge: 2016-01-16 | Disposition: A | Discharge status: Home / Self Care | Payer: PPO | Attending: Emergency Medicine | Admitting: Emergency Medicine

## 2016-01-15 DIAGNOSIS — S0990XA Unspecified injury of head, initial encounter: Secondary | ICD-10-CM

## 2016-01-15 DIAGNOSIS — S0003XA Contusion of scalp, initial encounter: Secondary | ICD-10-CM | POA: Diagnosis not present

## 2016-01-15 DIAGNOSIS — M79671 Pain in right foot: Secondary | ICD-10-CM | POA: Diagnosis not present

## 2016-01-15 DIAGNOSIS — Z79899 Other long term (current) drug therapy: Secondary | ICD-10-CM | POA: Insufficient documentation

## 2016-01-15 DIAGNOSIS — Y939 Activity, unspecified: Secondary | ICD-10-CM | POA: Insufficient documentation

## 2016-01-15 DIAGNOSIS — T148XXA Other injury of unspecified body region, initial encounter: Secondary | ICD-10-CM

## 2016-01-15 DIAGNOSIS — Y929 Unspecified place or not applicable: Secondary | ICD-10-CM | POA: Insufficient documentation

## 2016-01-15 DIAGNOSIS — Y999 Unspecified external cause status: Secondary | ICD-10-CM | POA: Diagnosis not present

## 2016-01-15 DIAGNOSIS — W19XXXA Unspecified fall, initial encounter: Secondary | ICD-10-CM

## 2016-01-15 DIAGNOSIS — W01198A Fall on same level from slipping, tripping and stumbling with subsequent striking against other object, initial encounter: Secondary | ICD-10-CM | POA: Insufficient documentation

## 2016-01-15 DIAGNOSIS — S0090XA Unspecified superficial injury of unspecified part of head, initial encounter: Secondary | ICD-10-CM

## 2016-01-15 NOTE — ED Triage Notes (Signed)
Fell on the steps at her house. Bumped back of head with hematoma, abrasion to rt elbow. Pain to rt foot with no swelling or deformity

## 2016-01-15 NOTE — ED Provider Notes (Signed)
[facility] Emergency Department Provider Note  ____________________________________________   First MD Initiated Contact with Patient 01/15/16 2321     (approximate)  I have reviewed the triage vital signs and the nursing notes.   HISTORY  Chief Complaint Fall    HPI Sheryl Barker is a 40 y.o. female with an extensive history of orthopedic injuries and chronic pain as well as a reported history of muscular dystrophy who presents for evaluation of acute injury sustained tonight when she slipped on some stairs.  She reports that she was leaving at about 9:30 PM to go to the store when her foot slipped off the stair and she fell backwards and struck the back of her head on concrete.  She has some acute pain in her right foot and the back of her head where there is a hematoma.  She is not sure if she lost consciousness but she has no amnesia surrounding the event.  She denies nausea, vomiting, headache other than at the spot of the injury, neck pain, chest pain, shortness of breath, abdominal pain.  She sustained no other injuries.  She has had multiple fractures in both of her feet previously and has chronic back pain for which she sees a doctor at the pain clinic and gets joint injections and is on a pain contract.  She is calm and alert at this time and was sleeping when I enter the room but awakens easily to light voice.  She reports the pain is moderate, worse with movement and weightbearing (for pain), and better with rest.   Past Medical History:  Diagnosis Date  . Anxiety   . Broken ankle    right 11/16  . Chest pain 05/04/2014  . Depression   . Fibroid   . Hypercholesteremia   . IBS (irritable bowel syndrome)   . MD (muscular dystrophy) ([place])   . Muscular dystrophy ([place])   . Vitamin D insufficiency 01/07/2015    Patient Active Problem List   Diagnosis Date Noted  . Lumbar facet syndrome (Bilateral) (L>R) 12/27/2015  . Elevated liver enzymes  09/27/2015  . Elevated sedimentation rate 09/27/2015  . Elevated C-reactive protein (CRP) 09/27/2015  . Opioid-induced constipation (OIC) 09/21/2015  . Therapeutic opioid induced constipation 09/19/2015  . Esophagospasm 09/19/2015  . Abnormal uterine bleeding 09/19/2015  . Neurogenic pain 06/29/2015  . Vitamin D deficiency 01/17/2015  . Chronic pain 01/07/2015  . Anxiety, generalized 01/07/2015  . H/O urinary tract infection 01/07/2015  . Hypertriglyceridemia 01/07/2015  . Congenital myotonic dystrophy ([place]) 01/07/2015  . Long term current use of opiate analgesic 01/07/2015  . Long term prescription opiate use 01/07/2015  . Opiate use (30 MME/Day) 01/07/2015  . Opiate dependence ([place]) 01/07/2015  . Encounter for therapeutic drug level monitoring 01/07/2015  . Chronic low back pain  (Location of Primary Source of Pain) (Bilateral) (L>R) 01/07/2015  . Lumbar spondylosis (L>R) 01/07/2015  . Chronic lower extremity pain (Location of Secondary source of pain) (Left) 01/07/2015  . Chronic lumbar radicular pain (left side) (L5 Dermatome) 01/07/2015  . 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 Diffuse myofascial pain syndrome 01/07/2015  . Musculoskeletal pain of upper extremity (Bilateral) (Proximal/Biceps muscles) 01/07/2015  . Chronic (intermittent) upper extremity pain 01/07/2015  . Lumbar facet hypertrophy (Bilateral) (L>R) 01/07/2015  . Grade 1 Retrolisthesis of L5 over S1 01/07/2015  . Fibroid 11/10/2014  . B12 deficiency 05/05/2014  . Adiposity 05/05/2014  . Adynamia 05/05/2014  .  Pain of right arm 05/05/2014  . Clinical depression 05/04/2014  . Breathlessness on exertion 05/04/2014  . HLD (hyperlipidemia) 05/04/2014  . Chest pain 05/04/2014  . Myotonic dystrophy ([place]) 05/27/2013    Past Surgical History:  Procedure Laterality Date  . TUBAL LIGATION      Prior to Admission medications   Medication Sig Start Date End Date Taking?  Authorizing Provider  bisacodyl (DULCOLAX) 5 MG EC tablet Take 2 tablets (10 mg total) by mouth at bedtime as needed for moderate constipation ((Hold for loose stool)). 01/01/16 03/31/16  Milinda Pointer, MD  DULoxetine (CYMBALTA) 20 MG capsule Take 20 mg by mouth 2 (two) times daily. 09/19/15   Historical Provider, MD  imipramine (TOFRANIL) 10 MG tablet Take 1 tablet (10 mg total) by mouth daily as needed (In case of chest pain due to esphogeal spasm). 09/21/15   Kathrine Haddock, NP  NEURONTIN 300 MG capsule Take 1 capsule (300 mg total) by mouth 3 (three) times daily. In addition take 1-3 capsules at bedtime. 01/01/16 03/31/16  Milinda Pointer, MD  oxyCODONE (OXY IR/ROXICODONE) 5 MG immediate release tablet Take 1 tablet (5 mg total) by mouth every 6 (six) hours as needed for severe pain. 01/01/16 01/31/16  Milinda Pointer, MD  oxyCODONE (OXY IR/ROXICODONE) 5 MG immediate release tablet Take 1 tablet (5 mg total) by mouth every 6 (six) hours as needed for severe pain. 01/31/16 03/01/16  Milinda Pointer, MD  oxyCODONE (OXY IR/ROXICODONE) 5 MG immediate release tablet Take 1 tablet (5 mg total) by mouth every 6 (six) hours as needed for severe pain. 03/01/16 03/31/16  Milinda Pointer, MD  Wheat Dextrin (BENEFIBER) POWD Stir 2 tsp. TID into 4-8 oz of any non-carbonated beverage or soft food (hot or cold) 01/01/16   Milinda Pointer, MD    Allergies Tramadol hcl; Hydrocodone-acetaminophen; and Morphine and related  Family History  Problem Relation Age of Onset  . Muscular dystrophy Mother   . Stroke Mother   . Arthritis Mother   . Seizures Mother   . Hyperlipidemia Mother   . Mental illness Mother   . Diabetes Mother   . Hypertension Father   . Gout Father   . Muscular dystrophy Sister   . Drug abuse Brother   . Diabetes Paternal Grandmother     Social History Social History  Substance Use Topics  . Smoking status: Never Smoker  . Smokeless tobacco: Never Used  . Alcohol use No      Review of Systems Constitutional: No fever/chills Eyes: No visual changes. ENT: No sore throat. Cardiovascular: Denies chest pain. Respiratory: Denies shortness of breath. Gastrointestinal: No abdominal pain.  No nausea, no vomiting.  No diarrhea.  No constipation. Genitourinary: Negative for dysuria. Musculoskeletal: Acute pain in right foot, chronic back pain.  Pain in back of head at site of contusion.  No neck pain Skin: Negative for rash. Neurological: Negative for headaches, focal weakness or numbness.  10-point ROS otherwise negative.  ____________________________________________   PHYSICAL EXAM:  VITAL SIGNS: ED Triage Vitals  Enc Vitals Group     BP 01/15/16 2233 (!) 127/96     Pulse Rate 01/15/16 2233 91     Resp 01/15/16 2233 16     Temp 01/15/16 2233 98.6 F (37 C)     Temp src --      SpO2 01/15/16 2233 94 %     Weight 01/15/16 2234 195 lb (88.5 kg)     Height 01/15/16 2234 5' 6.5" (1.689 m)  Head Circumference --      Peak Flow --      Pain Score 01/15/16 2234 5     Pain Loc --      Pain Edu? --      Excl. in [place]? --     Constitutional: Alert and oriented. Well appearing and in no acute distress. Eyes: Conjunctivae are normal. PERRL. EOMI. Head: Large hematoma on the right occiput consistent with her history of falling and hitting concrete.  There is no evidence of skull fracture and there is no laceration.  It is tender to palpation.  No sign of basal skull fracture.  Neck: No stridor.  No meningeal signs.  No cervical spine tenderness to palpation. Cardiovascular: Normal rate, regular rhythm. Good peripheral circulation. Grossly normal heart sounds. Respiratory: Normal respiratory effort.  No retractions. Lungs CTAB. Gastrointestinal: Soft and nontender. No distention.  Musculoskeletal: I palpated the patient's right foot extensively and was unable to identify a specific spot that seem to elicit any tenderness.  She has normal range of motion, no  sign of external injury, no swelling/edema, no ecchymosis.  She states that when she pushed down on my hand she experienced a bit of tenderness but overall she is weightbearing with no significant pain and no external evidence of injury. Neurologic:  Normal speech and language. No gross focal neurologic deficits are appreciated.  Skin:  Skin is warm, dry and intact. No rash noted. Psychiatric: Mood and affect are normal. Speech and behavior are normal.  ____________________________________________   LABS (all labs ordered are listed, but only abnormal results are displayed)  Labs Reviewed - No data to display ____________________________________________  EKG  None - EKG not ordered by ED physician ____________________________________________  RADIOLOGY   No results found.  ____________________________________________   PROCEDURES  Procedure(s) performed:   Procedures   Critical Care performed: No ____________________________________________   INITIAL IMPRESSION / ASSESSMENT AND PLAN / ED COURSE  Pertinent labs & imaging results that were available during my care of the patient were reviewed by me and considered in my medical decision making (see chart for details).  No indication for head CT by [place] head CT rules, nor CT cervical spine by [facility].  She also has no external evidence of any injury to her right foot and her exam was reassuring.  I offered her both radiographs of the foot and a head CT but explained why do not think they are necessary, and she agrees that she feels fine and does not want the imaging.  Since she is comfortable enough that she is sleeping without any difficulty Hardin Negus appropriate for her to go home and take her regular chronic pain medication.  I gave my usual and customary return precautions.  She can follow up with her regular doctor.    ____________________________________________  FINAL CLINICAL IMPRESSION(S) / ED DIAGNOSES  Final  diagnoses:  Fall, initial encounter  Minor closed head injury  Contusion of scalp, initial encounter  Hematoma  Right foot pain     MEDICATIONS GIVEN DURING THIS VISIT:  Medications - No data to display   NEW OUTPATIENT MEDICATIONS STARTED DURING THIS VISIT:  New Prescriptions   No medications on file    Modified Medications   No medications on file    Discontinued Medications   No medications on file     Note:  This document was prepared using Dragon voice recognition software and may include unintentional dictation errors.    Hinda Kehr, MD 01/15/16 [phone]

## 2016-01-15 NOTE — ED Notes (Signed)
Patient with complaints of right foot pain. Lacks eye contact when speaking with patient. Patient would like to "go to sleep" and was just wondering "if it's okay since she bumped her head." Explained to patient it would be okay for her to nap before the doctor came in.

## 2016-01-15 NOTE — Discharge Instructions (Signed)
You were seen in the Emergency Department (ED) today for a head injury and foot pain.  Your exam was reassuring with no signs or symptoms or acute or severe injury.  Signs of a more serious head injury include vomiting, severe headache, excessive sleepiness or confusion, and weakness or numbness in your face, arms or legs.  Return immediately to the Emergency Department if you experience any of these more concerning symptoms.    Rest, avoid strenuous physical or mental activity, and avoid activities that could potentially result in another head injury until all your symptoms from this head injury are completely resolved for at least 2-3 weeks.  If you participate in sports, get cleared by your doctor or trainer before returning to play.  You may take ibuprofen or acetaminophen over the counter according to label instructions for mild headache or scalp soreness.

## 2016-01-16 NOTE — ED Notes (Signed)
Patient's discharge instructions reviewed with patient and husband. No questions at this time.

## 2016-01-18 DIAGNOSIS — M7989 Other specified soft tissue disorders: Secondary | ICD-10-CM | POA: Diagnosis not present

## 2016-01-18 DIAGNOSIS — S99921A Unspecified injury of right foot, initial encounter: Secondary | ICD-10-CM | POA: Diagnosis not present

## 2016-01-18 DIAGNOSIS — W108XXA Fall (on) (from) other stairs and steps, initial encounter: Secondary | ICD-10-CM | POA: Diagnosis not present

## 2016-01-18 DIAGNOSIS — G710 Muscular dystrophy: Secondary | ICD-10-CM | POA: Diagnosis not present

## 2016-01-18 DIAGNOSIS — Z885 Allergy status to narcotic agent status: Secondary | ICD-10-CM | POA: Diagnosis not present

## 2016-01-18 DIAGNOSIS — E7800 Pure hypercholesterolemia, unspecified: Secondary | ICD-10-CM | POA: Diagnosis not present

## 2016-01-18 DIAGNOSIS — Z79899 Other long term (current) drug therapy: Secondary | ICD-10-CM | POA: Diagnosis not present

## 2016-01-18 DIAGNOSIS — S93601A Unspecified sprain of right foot, initial encounter: Secondary | ICD-10-CM | POA: Diagnosis not present

## 2016-01-18 DIAGNOSIS — M79671 Pain in right foot: Secondary | ICD-10-CM | POA: Diagnosis not present

## 2016-01-18 DIAGNOSIS — M722 Plantar fascial fibromatosis: Secondary | ICD-10-CM | POA: Diagnosis not present

## 2016-01-19 ENCOUNTER — Ambulatory Visit: Payer: PPO | Admitting: Pain Medicine

## 2016-03-20 ENCOUNTER — Encounter: Payer: PPO | Admitting: Pain Medicine

## 2016-04-17 ENCOUNTER — Ambulatory Visit: Payer: PPO | Admitting: Pain Medicine

## 2016-05-31 IMAGING — MR MR LUMBAR SPINE W/O CM
5 series · 38 of 48 positions shown · non-contrast
Comparison: Lumbar radiographs 10/16/2013. Chest radiographs
10/14/2012.

CLINICAL DATA: 39-year-old female with muscular dystrophy. Left
buttock pain since [REDACTED] which for 3 weeks has been radiating
down the left lower extremity medially, and medially to the groin
for 1 week. Initial encounter.

EXAM:
MRI LUMBAR SPINE WITHOUT CONTRAST
TECHNIQUE: Multiplanar, multisequence MR imaging of the lumbar spine was
performed. No intravenous contrast was administered.

[Series 2: T2 · sagittal · 4.0mm · 0.81mm/px · 7 of 17 slices shown (1 of 2)]
[im 1/17]
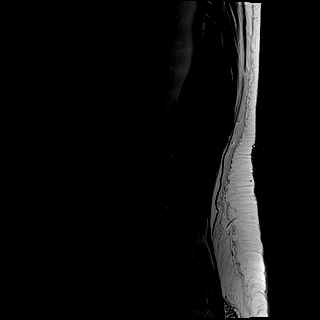
[im 3/17]
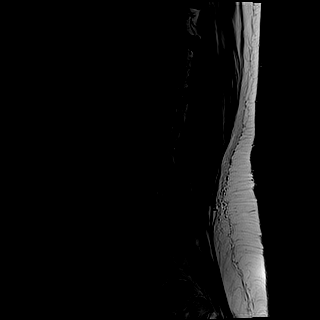
[im 6/17]
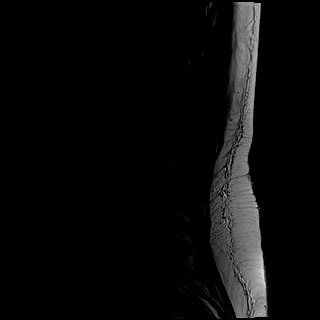
[im 9/17]
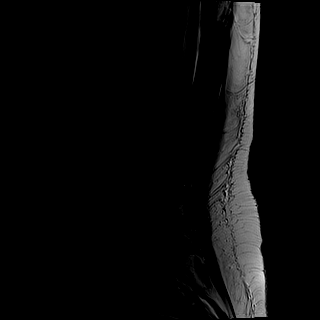
[im 11/17]
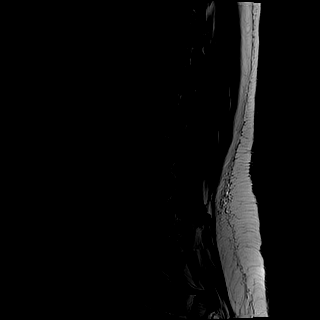
[im 14/17]
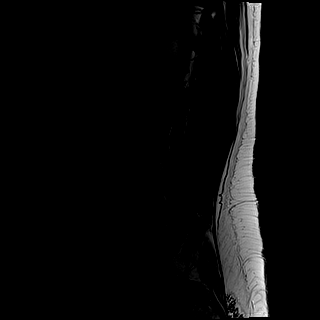
[im 17/17]
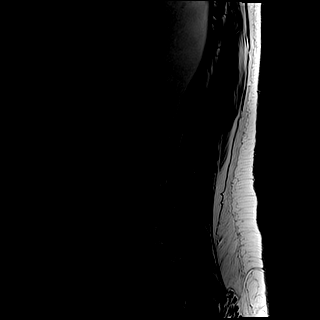

[Series 3: T1 · sagittal · 4.0mm · 0.81mm/px · 7 of 17 slices shown (1 of 2)]
[im 1/17]
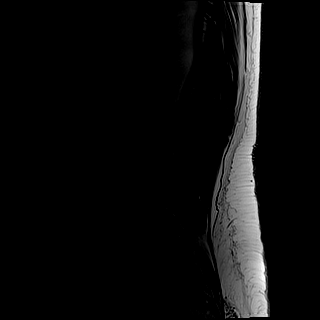
[im 3/17]
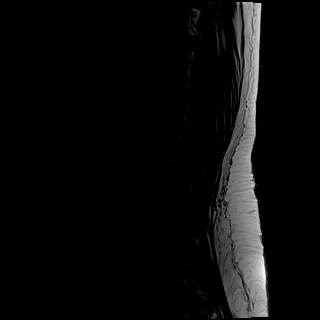
[im 6/17]
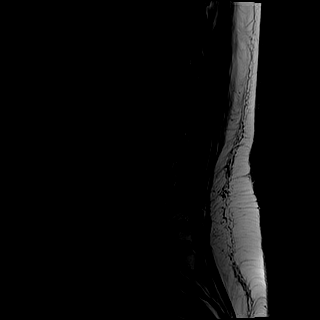
[im 9/17]
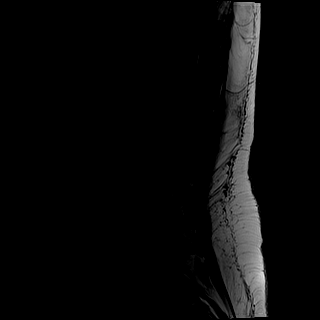
[im 11/17]
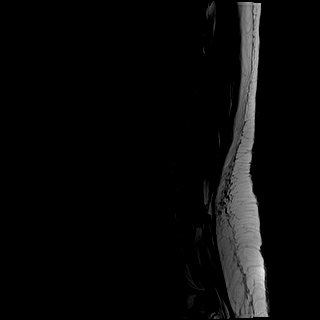
[im 14/17]
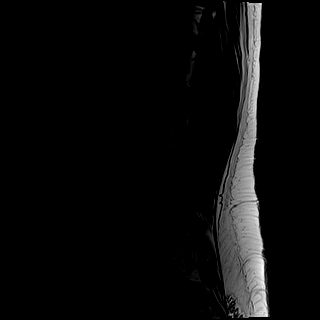
[im 17/17]
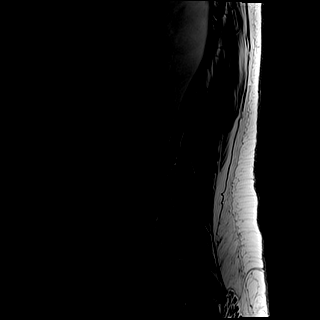

[Series 4: STIR · sagittal · 4.0mm · 1.02mm/px · 6 of 17 slices shown]
[im 1/17]
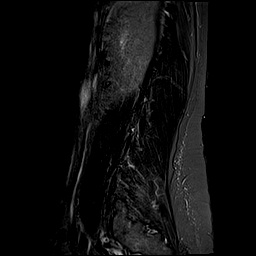
[im 4/17]
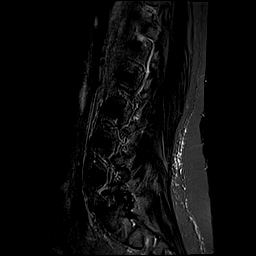
[im 7/17]
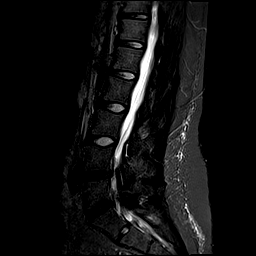
[im 10/17]
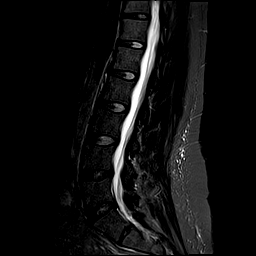
[im 13/17]
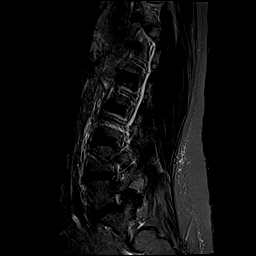
[im 17/17]
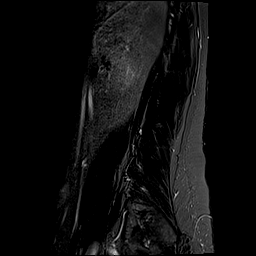

[Series 5: T2 · axial · 4.0mm · 0.78mm/px · z∈[-49,+160]mm · 10 of 38 slices shown (2 of 2)]
[im 1/38]
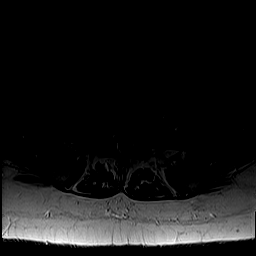
[im 3/38]
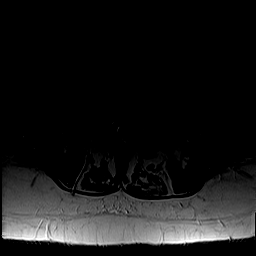
[im 6/38]
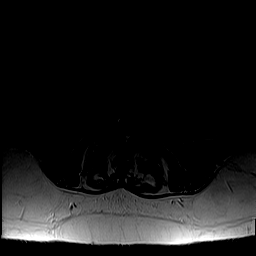
[im 9/38]
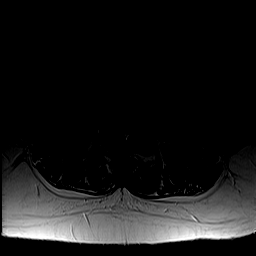
[im 12/38]
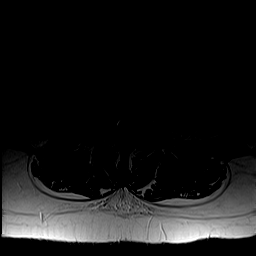
[im 18/38]
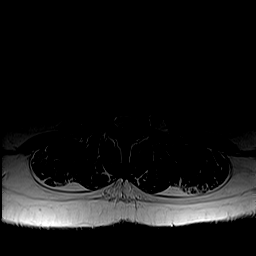
[im 20/38]
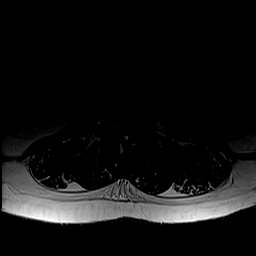
[im 26/38]
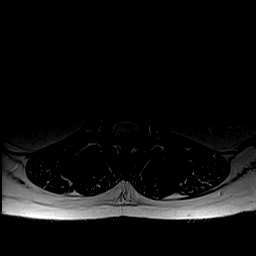
[im 32/38]
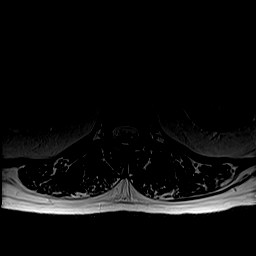
[im 38/38]
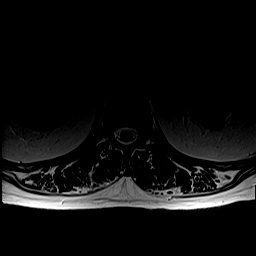

[Series 6: T1 · axial · 4.0mm · 0.39mm/px · z∈[-49,+160]mm · 8 of 38 slices shown (2 of 2)]
[im 1/38]
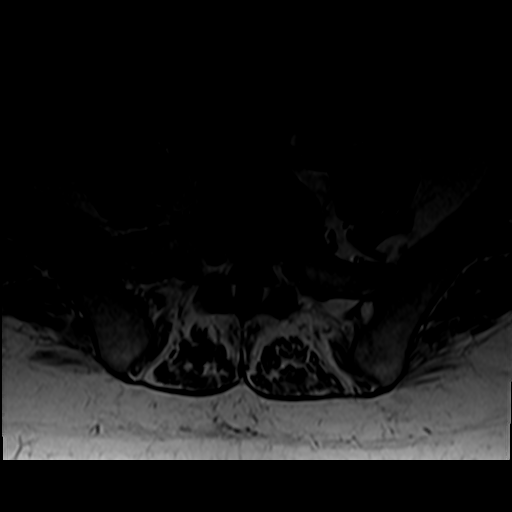
[im 6/38]
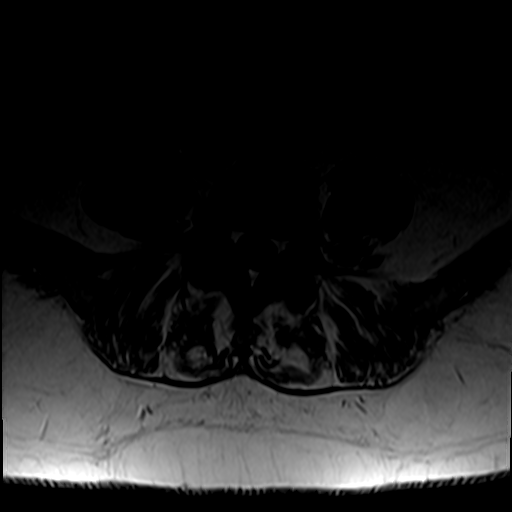
[im 12/38]
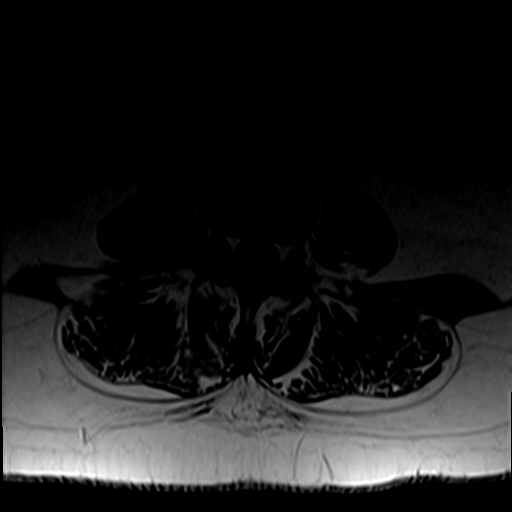
[im 18/38]
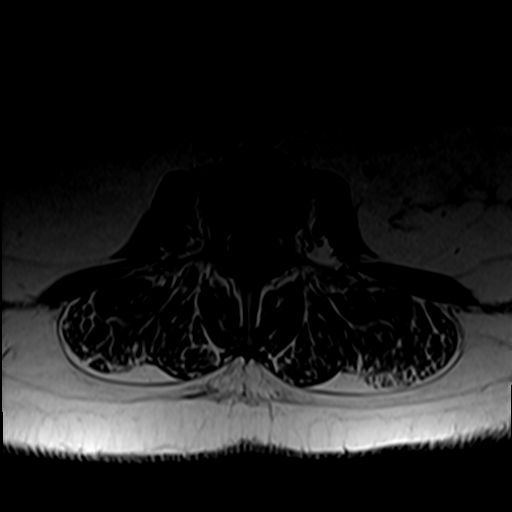
[im 20/38]
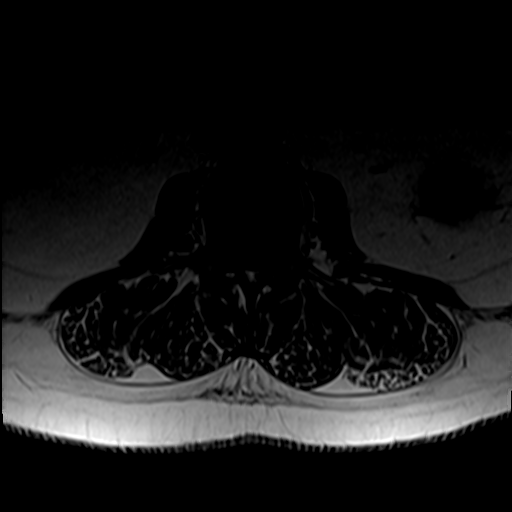
[im 26/38]
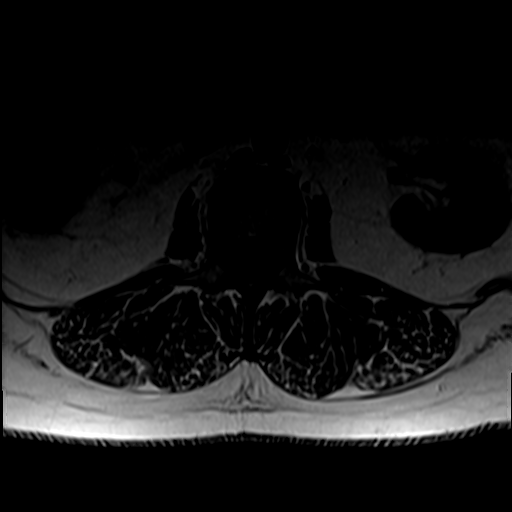
[im 32/38]
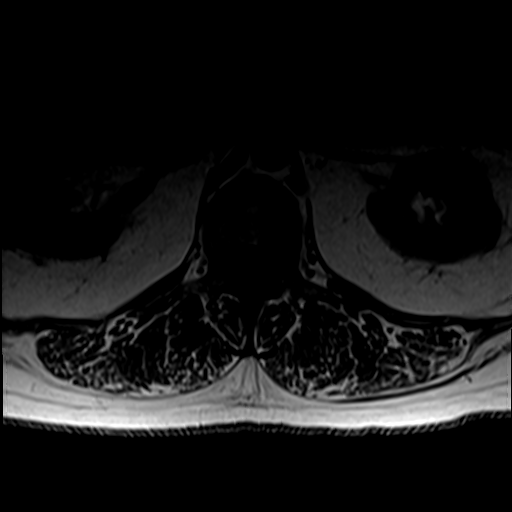
[im 38/38]
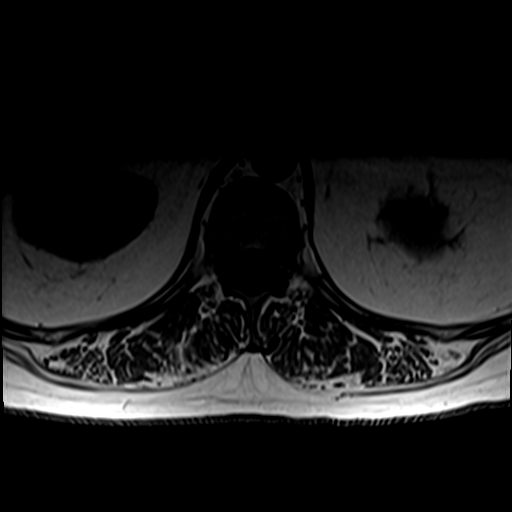

[38 of 48 positions shown; findings below may reference images not displayed]

FINDINGS: 12 full size ribs demonstrated in 4589. Normal lumbar segmentation
other than a small S1-S2 disc space, but the S1 level is otherwise
sacralized.

Mild retrolisthesis of L5 on S1. Mild straightening of upper lumbar
lordosis. Otherwise normal vertebral height and alignment. No marrow
edema or evidence of acute osseous abnormality.

Visualized lower thoracic spinal cord is normal with conus medularis
at L1-L2.

Visualized abdominal viscera and paraspinal soft tissues are within
normal limits.

T11-T12:  Negative aside from mild facet hypertrophy.

T12-L1:  Negative.

L1-L2:  Negative.

L2-L3:  Negative.

L3-L4:  Negative.

L4-L5: Mild disc desiccation and circumferential disc bulge. Mild
facet and ligament flavum hypertrophy greater on the left.
Superimposed small broad-based left foraminal disc protrusion with
annular fissure best seen on series 2, image 12 and series 5, image
29. This is in proximity to the exiting left L4 nerve. No
significant stenosis.

L5-S1: Mild disc desiccation and circumferential disc bulge. Subtle
left subarticular disc protrusion with annular fissure best seen on
series 2, image 11, also on series 5, image 35. No stenosis, but
this finding is in proximity to the exiting left L5 and descending
left S1 nerves. Superimposed mild facet hypertrophy.
IMPRESSION: 1. Mild L4-L5 and L5-S1 disc degeneration with small disc
herniations at both levels which could be a source for left side L4
through S1 radiculitis as above.
2. No lumbar spinal stenosis, and other lumbar levels are negative.

## 2017-01-03 HISTORY — PX: BREAST BIOPSY: SHX20

## 2017-03-18 ENCOUNTER — Emergency Department
Admission: EM | Admit: 2017-03-18 | Discharge: 2017-03-19 | Disposition: A | Discharge status: Home / Self Care | Payer: Medicare HMO | Attending: Emergency Medicine | Admitting: Emergency Medicine

## 2017-03-18 DIAGNOSIS — Z20828 Contact with and (suspected) exposure to other viral communicable diseases: Secondary | ICD-10-CM | POA: Diagnosis present

## 2017-03-18 DIAGNOSIS — Z79899 Other long term (current) drug therapy: Secondary | ICD-10-CM | POA: Diagnosis not present

## 2017-03-18 NOTE — ED Provider Notes (Signed)
[facility] Emergency Department Provider Note  ____________________________________________  Time seen: Approximately 11:00 PM  I have reviewed the triage vital signs and the nursing notes.   HISTORY  Chief Complaint Possible viral infection    HPI Sheryl Barker is a 42 y.o. female who presents emergency department complaining of possible exposure to viral respiratory infection.  Patient reports that she has multiple chronic medical problems including muscular dystrophy and suppressed immune system.  Patient was exposed to her niece who was showing signs of viral exanthem.  Patient currently denies any fevers or chills, nasal congestion, sore throat, ear pain, cough, shortness of breath, abdominal pain, nausea vomiting, diarrhea or constipation.  Patient was unsure whether she could have transmission of was exposed to her niece yesterday.  Past Medical History:  Diagnosis Date  . Anxiety   . Broken ankle    right 11/16  . Chest pain 05/04/2014  . Depression   . Fibroid   . Hypercholesteremia   . IBS (irritable bowel syndrome)   . MD (muscular dystrophy)   . Muscular dystrophy   . Vitamin D insufficiency 01/07/2015    Patient Active Problem List   Diagnosis Date Noted  . Lumbar facet syndrome (Bilateral) (L>R) 12/27/2015  . Elevated liver enzymes 09/27/2015  . Elevated sedimentation rate 09/27/2015  . Elevated C-reactive protein (CRP) 09/27/2015  . Opioid-induced constipation (OIC) 09/21/2015  . Therapeutic opioid induced constipation 09/19/2015  . Esophagospasm 09/19/2015  . Abnormal uterine bleeding 09/19/2015  . Neurogenic pain 06/29/2015  . Vitamin D deficiency 01/17/2015  . Chronic pain 01/07/2015  . Anxiety, generalized 01/07/2015  . H/O urinary tract infection 01/07/2015  . Hypertriglyceridemia 01/07/2015  . 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 Long term current use of opiate analgesic 01/07/2015  . Long term prescription  opiate use 01/07/2015  . Opiate use (30 MME/Day) 01/07/2015  . Opiate dependence ([place]) 01/07/2015  . Encounter for therapeutic drug level monitoring 01/07/2015  . Chronic low back pain  (Location of Primary Source of Pain) (Bilateral) (L>R) 01/07/2015  . Lumbar spondylosis (L>R) 01/07/2015  . Chronic lower extremity pain (Location of Secondary source of pain) (Left) 01/07/2015  . Chronic lumbar radicular pain (left side) (L5 Dermatome) 01/07/2015  . 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 Diffuse myofascial pain syndrome 01/07/2015  . Musculoskeletal pain of upper extremity (Bilateral) (Proximal/Biceps muscles) 01/07/2015  . Chronic (intermittent) upper extremity pain 01/07/2015  . Lumbar facet hypertrophy (Bilateral) (L>R) 01/07/2015  . Grade 1 Retrolisthesis of L5 over S1 01/07/2015  . Fibroid 11/10/2014  . B12 deficiency 05/05/2014  . Adiposity 05/05/2014  . Adynamia 05/05/2014  . Pain of right arm 05/05/2014  . Clinical depression 05/04/2014  . Breathlessness on exertion 05/04/2014  . HLD (hyperlipidemia) 05/04/2014  . Chest pain 05/04/2014  . Myotonic dystrophy ([place]) 05/27/2013    Past Surgical History:  Procedure Laterality Date  . TUBAL LIGATION      Prior to Admission medications   Medication Sig Start Date End Date Taking? Authorizing Provider  bisacodyl (DULCOLAX) 5 MG EC tablet Take 2 tablets (10 mg total) by mouth at bedtime as needed for moderate constipation ((Hold for loose stool)). 01/01/16 03/31/16  Milinda Pointer, MD  DULoxetine (CYMBALTA) 20 MG capsule Take 20 mg by mouth 2 (two) times daily. 09/19/15   [provider]  imipramine (TOFRANIL) 10 MG tablet Take 1 tablet (10 mg total) by mouth daily as needed (In case of chest pain due  to esphogeal spasm). 09/21/15   Kathrine Haddock, NP  NEURONTIN 300 MG capsule Take 1 capsule (300 mg total) by mouth 3 (three) times daily. In addition take 1-3 capsules at bedtime.  01/01/16 03/31/16  Milinda Pointer, MD  oxyCODONE (OXY IR/ROXICODONE) 5 MG immediate release tablet Take 1 tablet (5 mg total) by mouth every 6 (six) hours as needed for severe pain. 01/01/16 01/31/16  Milinda Pointer, MD  oxyCODONE (OXY IR/ROXICODONE) 5 MG immediate release tablet Take 1 tablet (5 mg total) by mouth every 6 (six) hours as needed for severe pain. 01/31/16 03/01/16  Milinda Pointer, MD  oxyCODONE (OXY IR/ROXICODONE) 5 MG immediate release tablet Take 1 tablet (5 mg total) by mouth every 6 (six) hours as needed for severe pain. 03/01/16 03/31/16  Milinda Pointer, MD  Wheat Dextrin (BENEFIBER) POWD Stir 2 tsp. TID into 4-8 oz of any non-carbonated beverage or soft food (hot or cold) 01/01/16   Milinda Pointer, MD    Allergies Tramadol hcl; Hydrocodone-acetaminophen; and Morphine and related  Family History  Problem Relation Age of Onset  . Muscular dystrophy Mother   . Stroke Mother   . Arthritis Mother   . Seizures Mother   . Hyperlipidemia Mother   . Mental illness Mother   . Diabetes Mother   . Hypertension Father   . Gout Father   . Muscular dystrophy Sister   . Drug abuse Brother   . Diabetes Paternal Grandmother     Social History Social History   Tobacco Use  . Smoking status: Never Smoker  . Smokeless tobacco: Never Used  Substance Use Topics  . Alcohol use: No  . Drug use: No     Review of Systems  Constitutional: No fever/chills Eyes: No visual changes. No discharge ENT: No upper respiratory complaints. Cardiovascular: no chest pain. Respiratory: no cough. No SOB. Gastrointestinal: No abdominal pain.  No nausea, no vomiting.  No diarrhea.  No constipation. Genitourinary: Negative for dysuria. No hematuria Musculoskeletal: Negative for musculoskeletal pain. Skin: Negative for rash, abrasions, lacerations, ecchymosis. Neurological: Negative for headaches, focal weakness or numbness. 10-point ROS otherwise  negative.  ____________________________________________   PHYSICAL EXAM:  VITAL SIGNS: ED Triage Vitals  Enc Vitals Group     BP 03/18/17 2116 116/72     Pulse Rate 03/18/17 2116 92     Resp 03/18/17 2116 17     Temp 03/18/17 2116 98.2 F (36.8 C)     Temp Source 03/18/17 2116 Oral     SpO2 03/18/17 2116 95 %     Weight 03/18/17 2116 195 lb (88.5 kg)     Height --      Head Circumference --      Peak Flow --      Pain Score 03/18/17 2115 7     Pain Loc --      Pain Edu? --      Excl. in [place]? --      Constitutional: Alert and oriented. Well appearing and in no acute distress. Eyes: Conjunctivae are normal. PERRL. EOMI. Head: Atraumatic. ENT:      Ears: EACs and TMs unremarkable bilaterally.      Nose: No congestion/rhinnorhea.      Mouth/Throat: Mucous membranes are moist.  Pharynx is nonerythematous and nonedematous.  Uvula is midline. Neck: No stridor.  Neck is supple full range of motion Hematological/Lymphatic/Immunilogical: No cervical lymphadenopathy. Cardiovascular: Normal rate, regular rhythm. Normal S1 and S2.  Good peripheral circulation. Respiratory: Normal respiratory effort without tachypnea or retractions.  Lungs CTAB. Good air entry to the bases with no decreased or absent breath sounds. Musculoskeletal: Full range of motion to all extremities. No gross deformities appreciated. Neurologic:  Normal speech and language. No gross focal neurologic deficits are appreciated.  Skin:  Skin is warm, dry and intact. No rash noted. Psychiatric: Mood and affect are normal. Speech and behavior are normal. Patient exhibits appropriate insight and judgement.   ____________________________________________   LABS (all labs ordered are listed, but only abnormal results are displayed)  Labs Reviewed - No data to display ____________________________________________  EKG   ____________________________________________  RADIOLOGY   No results  found.  ____________________________________________    PROCEDURES  Procedure(s) performed:    Procedures    Medications - No data to display   ____________________________________________   INITIAL IMPRESSION / ASSESSMENT AND PLAN / ED COURSE  Pertinent labs & imaging results that were available during my care of the patient were reviewed by me and considered in my medical decision making (see chart for details).  Review of the [place] CSRS was performed in accordance of the [place] prior to dispensing any controlled drugs.     Patient's diagnosis is consistent with exposure to viral illness.  Patient has been exposed to her niece who has viral respiratory illness and viral exanthem.  Patient is asymptomatic at this time.  Exam is reassuring with no indication of infectious process.  No prescriptions at this time.  Patient will follow up with primary care as needed..  Patient is given ED precautions to return to the ED for any worsening or new symptoms.     ____________________________________________  FINAL CLINICAL IMPRESSION(S) / ED DIAGNOSES  Final diagnoses:  Exposure to viral disease      NEW MEDICATIONS STARTED DURING THIS VISIT:  ED Discharge Orders    None          This chart was dictated using voice recognition software/Dragon. Despite best efforts to proofread, errors can occur which can change the meaning. Any change was purely unintentional.    Darletta Moll, PA-C 03/18/17 [place], [place], MD 03/20/17 [phone]

## 2017-03-18 NOTE — ED Triage Notes (Signed)
Patient reports that she was exposed to viral exanthem. Patient denies rash and/or any symptoms. Patient would like to be checked for viral syndrome.

## 2017-06-04 DIAGNOSIS — G8929 Other chronic pain: Secondary | ICD-10-CM | POA: Diagnosis not present

## 2017-06-04 DIAGNOSIS — E7800 Pure hypercholesterolemia, unspecified: Secondary | ICD-10-CM | POA: Diagnosis not present

## 2017-06-04 DIAGNOSIS — E559 Vitamin D deficiency, unspecified: Secondary | ICD-10-CM | POA: Diagnosis not present

## 2017-06-04 DIAGNOSIS — F411 Generalized anxiety disorder: Secondary | ICD-10-CM | POA: Diagnosis not present

## 2017-06-04 DIAGNOSIS — E669 Obesity, unspecified: Secondary | ICD-10-CM | POA: Diagnosis not present

## 2017-06-11 ENCOUNTER — Encounter: Payer: Self-pay | Admitting: Nurse Practitioner

## 2017-06-11 ENCOUNTER — Ambulatory Visit: Payer: Medicare HMO | Attending: Nurse Practitioner | Admitting: Nurse Practitioner

## 2017-06-11 ENCOUNTER — Ambulatory Visit
Admission: RE | Admit: 2017-06-11 | Discharge: 2017-06-11 | Disposition: A | Discharge status: Home / Self Care | Payer: Medicare HMO | Source: Ambulatory Visit | Attending: Nurse Practitioner | Admitting: Nurse Practitioner

## 2017-06-11 ENCOUNTER — Other Ambulatory Visit: Payer: Self-pay

## 2017-06-11 VITALS — BP 115/72 | HR 73 | Temp 98.2°F | Resp 16 | Ht 66.0 in | Wt 195.0 lb

## 2017-06-11 DIAGNOSIS — M899 Disorder of bone, unspecified: Secondary | ICD-10-CM

## 2017-06-11 DIAGNOSIS — G8929 Other chronic pain: Secondary | ICD-10-CM

## 2017-06-11 DIAGNOSIS — G894 Chronic pain syndrome: Secondary | ICD-10-CM | POA: Diagnosis not present

## 2017-06-11 DIAGNOSIS — Z79899 Other long term (current) drug therapy: Secondary | ICD-10-CM | POA: Diagnosis not present

## 2017-06-11 DIAGNOSIS — K589 Irritable bowel syndrome without diarrhea: Secondary | ICD-10-CM | POA: Insufficient documentation

## 2017-06-11 DIAGNOSIS — M5442 Lumbago with sciatica, left side: Principal | ICD-10-CM

## 2017-06-11 DIAGNOSIS — E7800 Pure hypercholesterolemia, unspecified: Secondary | ICD-10-CM | POA: Insufficient documentation

## 2017-06-11 DIAGNOSIS — M546 Pain in thoracic spine: Secondary | ICD-10-CM | POA: Diagnosis not present

## 2017-06-11 DIAGNOSIS — M1288 Other specific arthropathies, not elsewhere classified, other specified site: Secondary | ICD-10-CM | POA: Insufficient documentation

## 2017-06-11 DIAGNOSIS — Z789 Other specified health status: Secondary | ICD-10-CM

## 2017-06-11 DIAGNOSIS — E559 Vitamin D deficiency, unspecified: Secondary | ICD-10-CM | POA: Insufficient documentation

## 2017-06-11 DIAGNOSIS — M549 Dorsalgia, unspecified: Secondary | ICD-10-CM

## 2017-06-11 DIAGNOSIS — F419 Anxiety disorder, unspecified: Secondary | ICD-10-CM | POA: Diagnosis not present

## 2017-06-11 DIAGNOSIS — M5441 Lumbago with sciatica, right side: Secondary | ICD-10-CM | POA: Diagnosis not present

## 2017-06-11 DIAGNOSIS — Z5181 Encounter for therapeutic drug level monitoring: Secondary | ICD-10-CM | POA: Diagnosis not present

## 2017-06-11 DIAGNOSIS — M79605 Pain in left leg: Secondary | ICD-10-CM | POA: Diagnosis not present

## 2017-06-11 DIAGNOSIS — M545 Low back pain: Secondary | ICD-10-CM | POA: Diagnosis not present

## 2017-06-11 DIAGNOSIS — Z79891 Long term (current) use of opiate analgesic: Secondary | ICD-10-CM | POA: Diagnosis not present

## 2017-06-11 DIAGNOSIS — M79604 Pain in right leg: Secondary | ICD-10-CM | POA: Diagnosis not present

## 2017-06-11 DIAGNOSIS — G7100 Muscular dystrophy, unspecified: Secondary | ICD-10-CM | POA: Diagnosis not present

## 2017-06-11 DIAGNOSIS — F329 Major depressive disorder, single episode, unspecified: Secondary | ICD-10-CM | POA: Diagnosis not present

## 2017-06-11 NOTE — Patient Instructions (Addendum)
____________________________________________________________________________________________  Appointment Policy Summary  It is our goal and responsibility to provide the medical community with assistance in the evaluation and management of patients with chronic pain. Unfortunately our resources are limited. Because we do not have an unlimited amount of time, or available appointments, we are required to closely monitor and manage their use. The following rules exist to maximize their use:  Patient's responsibilities: 1. Punctuality:  At what time should I arrive? You should be physically present in our office 30 minutes before your scheduled appointment. Your scheduled appointment is with your assigned healthcare provider. However, it takes 5-10 minutes to be "checked-in", and another 15 minutes for the nurses to do the admission. If you arrive to our office at the time you were given for your appointment, you will end up being at least 20-25 minutes late to your appointment with the provider. 2. Tardiness:  What happens if I arrive only a few minutes after my scheduled appointment time? You will need to reschedule your appointment. The cutoff is your appointment time. This is why it is so important that you arrive at least 30 minutes before that appointment. If you have an appointment scheduled for 10:00 AM and you arrive at 10:01, you will be required to reschedule your appointment.  3. Plan ahead:  Always assume that you will encounter traffic on your way in. Plan for it. If you are dependent on a driver, make sure they understand these rules and the need to arrive early. 4. Other appointments and responsibilities:  Avoid scheduling any other appointments before or after your pain clinic appointments.  5. Be prepared:  Write down everything that you need to discuss with your healthcare provider and give this information to the admitting nurse. Write down the medications that you will need  refilled. Bring your pills and bottles (even the empty ones), to all of your appointments, except for those where a procedure is scheduled. 6. No children or pets:  Find someone to take care of them. It is not appropriate to bring them in. 7. Scheduling changes:  We request "advanced notification" of any changes or cancellations. 8. Advanced notification:  Defined as a time period of more than 24 hours prior to the originally scheduled appointment. This allows for the appointment to be offered to other patients. 9. Rescheduling:  When a visit is rescheduled, it will require the cancellation of the original appointment. For this reason they both fall within the category of "Cancellations".  10. Cancellations:  They require advanced notification. Any cancellation less than 24 hours before the  appointment will be recorded as a "No Show". 11. No Show:  Defined as an unkept appointment where the patient failed to notify or declare to the practice their intention or inability to keep the appointment.  Corrective process for repeat offenders:  1. Tardiness: Three (3) episodes of rescheduling due to late arrivals will be recorded as one (1) "No Show". 2. Cancellation or reschedule: Three (3) cancellations or rescheduling will be recorded as one (1) "No Show". 3. "No Shows": Three (3) "No Shows" within a 12 month period will result in discharge from the practice. ____________________________________________________________________________________________  ____________________________________________________________________________________________  Pain Scale  Introduction: The pain score used by this practice is the Verbal Numerical Rating Scale (VNRS-11). This is an 11-point scale. It is for adults and children 10 years or older. There are significant differences in how the pain score is reported, used, and applied. Forget everything you learned in the past and learn  this scoring system.  General  Information: The scale should reflect your current level of pain. Unless you are specifically asked for the level of your worst pain, or your average pain. If you are asked for one of these two, then it should be understood that it is over the past 24 hours.  Basic Activities of Daily Living (ADL): Personal hygiene, dressing, eating, transferring, and using restroom.  Instructions: Most patients tend to report their level of pain as a combination of two factors, their physical pain and their psychosocial pain. This last one is also known as "suffering" and it is reflection of how physical pain affects you socially and psychologically. From now on, report them separately. From this point on, when asked to report your pain level, report only your physical pain. Use the following table for reference.  Pain Clinic Pain Levels (0-5/10)  Pain Level Score  Description  No Pain 0   Mild pain 1 Nagging, annoying, but does not interfere with basic activities of daily living (ADL). Patients are able to eat, bathe, get dressed, toileting (being able to get on and off the toilet and perform personal hygiene functions), transfer (move in and out of bed or a chair without assistance), and maintain continence (able to control bladder and bowel functions). Blood pressure and heart rate are unaffected. A normal heart rate for a healthy adult ranges from 60 to 100 bpm (beats per minute).   Mild to moderate pain 2 Noticeable and distracting. Impossible to hide from other people. More frequent flare-ups. Still possible to adapt and function close to normal. It can be very annoying and may have occasional stronger flare-ups. With discipline, patients may get used to it and adapt.   Moderate pain 3 Interferes significantly with activities of daily living (ADL). It becomes difficult to feed, bathe, get dressed, get on and off the toilet or to perform personal hygiene functions. Difficult to get in and out of bed or a chair  without assistance. Very distracting. With effort, it can be ignored when deeply involved in activities.   Moderately severe pain 4 Impossible to ignore for more than a few minutes. With effort, patients may still be able to manage work or participate in some social activities. Very difficult to concentrate. Signs of autonomic nervous system discharge are evident: dilated pupils (mydriasis); mild sweating (diaphoresis); sleep interference. Heart rate becomes elevated (>115 bpm). Diastolic blood pressure (lower number) rises above 100 mmHg. Patients find relief in laying down and not moving.   Severe pain 5 Intense and extremely unpleasant. Associated with frowning face and frequent crying. Pain overwhelms the senses.  Ability to do any activity or maintain social relationships becomes significantly limited. Conversation becomes difficult. Pacing back and forth is common, as getting into a comfortable position is nearly impossible. Pain wakes you up from deep sleep. Physical signs will be obvious: pupillary dilation; increased sweating; goosebumps; brisk reflexes; cold, clammy hands and feet; nausea, vomiting or dry heaves; loss of appetite; significant sleep disturbance with inability to fall asleep or to remain asleep. When persistent, significant weight loss is observed due to the complete loss of appetite and sleep deprivation.  Blood pressure and heart rate becomes significantly elevated. Caution: If elevated blood pressure triggers a pounding headache associated with blurred vision, then the patient should immediately seek attention at an urgent or emergency care unit, as these may be signs of an impending stroke.    Emergency Department Pain Levels (6-10/10)  Emergency Room Pain 6 Severely   limiting. Requires emergency care and should not be seen or managed at an outpatient pain management facility. Communication becomes difficult and requires great effort. Assistance to reach the emergency department  may be required. Facial flushing and profuse sweating along with potentially dangerous increases in heart rate and blood pressure will be evident.   Distressing pain 7 Self-care is very difficult. Assistance is required to transport, or use restroom. Assistance to reach the emergency department will be required. Tasks requiring coordination, such as bathing and getting dressed become very difficult.   Disabling pain 8 Self-care is no longer possible. At this level, pain is disabling. The individual is unable to do even the most "basic" activities such as walking, eating, bathing, dressing, transferring to a bed, or toileting. Fine motor skills are lost. It is difficult to think clearly.   Incapacitating pain 9 Pain becomes incapacitating. Thought processing is no longer possible. Difficult to remember your own name. Control of movement and coordination are lost.   The worst pain imaginable 10 At this level, most patients pass out from pain. When this level is reached, collapse of the autonomic nervous system occurs, leading to a sudden drop in blood pressure and heart rate. This in turn results in a temporary and dramatic drop in blood flow to the brain, leading to a loss of consciousness. Fainting is one of the body's self defense mechanisms. Passing out puts the brain in a calmed state and causes it to shut down for a while, in order to begin the healing process.    Summary: 1. Refer to this scale when providing [place] with your pain level. 2. Be accurate and careful when reporting your pain level. This will help with your care. 3. Over-reporting your pain level will lead to loss of credibility. 4. Even a level of 1/10 means that there is pain and will be treated at our facility. 5. High, inaccurate reporting will be documented as "Symptom Exaggeration", leading to loss of credibility and suspicions of possible secondary gains such as obtaining more narcotics, or wanting to appear disabled, for  fraudulent reasons. 6. Only pain levels of 5 or below will be seen at our facility. 7. Pain levels of 6 and above will be sent to the Emergency Department and the appointment cancelled. ____________________________________________________________________________________________  BMI Assessment: Estimated body mass index is 31.47 kg/m as calculated from the following:   Height as of this encounter: 5\' 6"  (1.676 m).   Weight as of this encounter: 195 lb (88.5 kg).  BMI interpretation table: BMI level Category Range association with higher incidence of chronic pain  <18 kg/m2 Underweight   18.5-24.9 kg/m2 Ideal body weight   25-29.9 kg/m2 Overweight Increased incidence by 20%  30-34.9 kg/m2 Obese (Class I) Increased incidence by 68%  35-39.9 kg/m2 Severe obesity (Class II) Increased incidence by 136%  >40 kg/m2 Extreme obesity (Class III) Increased incidence by 254%   BMI Readings from Last 4 Encounters:  06/11/17 31.47 kg/m  03/18/17 31.00 kg/m  01/15/16 31.00 kg/m  12/27/15 31.00 kg/m   Wt Readings from Last 4 Encounters:  06/11/17 195 lb (88.5 kg)  03/18/17 195 lb (88.5 kg)  01/15/16 195 lb (88.5 kg)  12/27/15 195 lb (88.5 kg)

## 2017-06-11 NOTE — Progress Notes (Signed)
Patient's Name: Sheryl Barker  MRN: [id number]  Referring Provider: Dion Body, MD  DOB: 14-Nov-1975  PCP: Dion Body, MD  DOS: 06/11/2017  Note by: Dionisio David NP  Service setting: Ambulatory outpatient  Specialty: Interventional Pain Management  Location: [facility] (AMB) Pain Management Facility    Patient type: New Patient    Primary Reason(s) for Visit: Initial Patient Evaluation CC: Back Pain (mid, lower) and Neck Pain  HPI  Sheryl Barker is a 42 y.o. year old, female patient, who comes today for an initial evaluation. 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[place]); Adynamia; Chest pain; Long term current use of opiate analgesic; Long term prescription opiate use; Opiate use (30 MME/Day); Opiate dependence ([place]); Encounter for therapeutic drug level monitoring; Chronic low back pain  (Location of Primary Source of Pain) (Bilateral) (L>R); Lumbar spondylosis (L>R); Chronic lower extremity pain (Location of Secondary source of pain) (Left); Chronic lumbar radicular pain (left side) (L5 Dermatome); Myotonic dystrophy, 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); Diffuse myofascial pain syndrome; Musculoskeletal pain of upper extremity (Bilateral) (Proximal/Biceps muscles); Chronic (intermittent) upper extremity pain; Lumbar facet hypertrophy (Bilateral) (L>R); Grade 1 Retrolisthesis of L5 over S1; Vitamin D deficiency; Pain of right arm; Myotonic dystrophy ([place]); Neurogenic pain; Therapeutic opioid induced constipation; Esophagospasm; Abnormal uterine bleeding; Opioid-induced constipation (OIC); Elevated liver enzymes; Elevated sedimentation rate; Elevated C-reactive protein (CRP); Lumbar facet syndrome (Bilateral) (L>R); History of depression; Mixed hyperlipidemia; Chronic bilateral low  back pain with bilateral sciatica (Primary Area of Pain)( midline); Chronic pain of both lower extremities (Secondary Area of Pain)(B)( (R>L)); Chronic upper back pain [facility] Area of Pain)(R>L); Pharmacologic therapy; Disorder of skeletal system; and Problems influencing health status on their problem list.. Her primarily concern today is the Back Pain (mid, lower) and Neck Pain  Pain Assessment: Location:   Back Radiating: butocks left down back of leg to calve Onset: More than a month ago Duration: Chronic pain Quality: Aching, Radiating, Discomfort, Nagging, Sore Severity: 7 /10 (self-reported pain score)  Note: Reported level is compatible with observation.                         When using our objective Pain Scale, levels between 6 and 10/10 are said to belong in an emergency room, as it progressively worsens from a 6/10, described as severely limiting, requiring emergency care not usually available at an outpatient pain management facility. At a 6/10 level, communication becomes difficult and requires great effort. Assistance to reach the emergency department may be required. Facial flushing and profuse sweating along with potentially dangerous increases in heart rate and blood pressure will be evident. Effect on ADL: dishes, some ADL's Timing: Constant Modifying factors: "Nothing"  Onset and Duration: Sudden and Present longer than 3 months Cause of pain: muscular dystrophy Severity: NAS-11 at its worse: 8/10, NAS-11 at its best: 5/10, NAS-11 now: 6/10 and NAS-11 on the average: 6/10 Timing: Morning, Afternoon, Night, During activity or exercise, After activity or exercise and After a period of immobility Aggravating Factors: no aggravating factors reported Alleviating Factors: no alleviating factors reported Associated Problems: Constipation, Fatigue, Numbness, Spasms, Weakness, Pain that wakes patient up and Pain that does not allow patient to sleep Quality of Pain: Aching,  Annoying, Intermittent, Disabling, Exhausting, Horrible, Nagging, Pulsating, Shooting, Tender, Throbbing and Uncomfortable Previous Examinations or Tests: Biopsy and CT scan  Previous Treatments: Epidural steroid injections and Narcotic medications  The patient comes into the clinics today for the first time for a chronic pain management evaluation. According to the patient her primary area of pain is in her lower back. She describes it as being midline.She denies previous accident or injury. She admits that she was a patient of Dr. Lowella Dandy in 2017. She had lumbar facet nerve blocks which were effective. She admits that she later moved to [place]. She admits that physical therapy is not effective he only makes her pain worse secondary to her muscular dystrophy. She denies any recent images.  Her second area of pain is in her lower extremities. She admits that she has occasional pain in her buttocks that goes down to the mid calf. She admits that the pain rotates from the back around to the front and into the back of her calf. She denies any numbness or tingling. She describes it as an aching pain. She denies previous nerve conduction study.  Her third area of pain is in her upper back. She describes it as being midline. She admits that maybe the right is greater than the left. She describes the pain as being in between her upper shoulders. She denies any previous surgery. She denies any previous treatment, or recent images.  Today I took the time to provide the patient with information regarding this pain practice. The patient was informed that the practice is divided into two sections: an interventional pain management section, as well as a completely separate and distinct medication management section. I explained that there are procedure days for interventional therapies, and evaluation days for follow-ups and medication management. Because of the amount of documentation required during both, they are  kept separated. This means that there is the possibility that she may be scheduled for a procedure on one day, and medication management the next. I have also informed her that because of staffing and facility limitations, this practice will no longer take patients for medication management only. To illustrate the reasons for this, I gave the patient the example of surgeons, and how inappropriate it would be to refer a patient to his/her care, just to write for the post-surgical antibiotics on a surgery done by a different surgeon.   Because interventional pain management is part of the board-certified specialty for the doctors, the patient was informed that joining this practice means that they are open to any and all interventional therapies. I made it clear that this does not mean that they will be forced to have any procedures done. What this means is that I believe interventional therapies to be essential part of the diagnosis and proper management of chronic pain conditions. Therefore, patients not interested in these interventional alternatives will be better served under the care of a different practitioner.  The patient was also made aware of my Comprehensive Pain Management Safety Guidelines where by joining this practice, they limit all of their nerve blocks and joint injections to those done by our practice, for as long as we are retained to manage their care. Historic Controlled Substance Pharmacotherapy Review  PMP and historical list of controlled substances: oxycodone 5 mg 4 times daily, diazepam 2 mg, hydrocodone/acetaminophen 5/325 mg, 1 offenders and cough syrup, tramadol 50 mg, triazolam 0.178m Highest opioid analgesic regimen found: hydrocodone/acetaminophen 5/325 mg 2 tablets 5 times daily (fill date 12/20/2013) hydrocodone 50 mg per day Most recent opioid analgesic: none Current opioid analgesics: none Highest recorded MME/day: 50 mg/day MME/day: 0  mg/day Medications: The patient  did not bring the medication(s) to the appointment, as requested in our "New Patient Package" Pharmacodynamics: Desired effects: Analgesia: The patient reports >50% benefit. Reported improvement in function: The patient reports medication allows her to accomplish basic ADLs. Clinically meaningful improvement in function (CMIF): Sustained CMIF goals met Perceived effectiveness: Described as relatively effective, allowing for increase in activities of daily living (ADL) Undesirable effects: Side-effects or Adverse reactions: None reported Historical Monitoring: The patient  reports that she does not use drugs. List of all UDS Test(s): No results found for: MDMA, COCAINSCRNUR, PCPSCRNUR, PCPQUANT, CANNABQUANT, THCU, [place] List of all Serum Drug Screening Test(s):  No results found for: AMPHSCRSER, BARBSCRSER, BENZOSCRSER, COCAINSCRSER, PCPSCRSER, PCPQUANT, THCSCRSER, CANNABQUANT, OPIATESCRSER, OXYSCRSER, PROPOXSCRSER Historical Background Evaluation: [place] PDMP: Six (6) year initial data search conducted.             [place] Department of public safety, offender search: Editor, commissioning Information) Non-contributory Risk Assessment Profile: Aberrant behavior: None observed or detected today Risk factors for fatal opioid overdose: None identified today Fatal overdose hazard ratio (HR): Calculation deferred Non-fatal overdose hazard ratio (HR): Calculation deferred Risk of opioid abuse or dependence: 0.7-3.0% with doses ? 36 MME/day and 6.1-26% with doses ? 120 MME/day. Substance use disorder (SUD) risk level: Pending results of Medical Psychology Evaluation for SUD Opioid risk tool (ORT) (Total Score): 1  ORT Scoring interpretation table:  Score <3 = Low Risk for SUD  Score between 4-7 = Moderate Risk for SUD  Score >8 = High Risk for Opioid Abuse   PHQ-2 Depression Scale:  Total score: 0  PHQ-2 Scoring interpretation table: (Score and probability of major depressive disorder)  Score 0 = No depression   Score 1 = 15.4% Probability  Score 2 = 21.1% Probability  Score 3 = 38.4% Probability  Score 4 = 45.5% Probability  Score 5 = 56.4% Probability  Score 6 = 78.6% Probability   PHQ-9 Depression Scale:  Total score: 0  PHQ-9 Scoring interpretation table:  Score 0-4 = No depression  Score 5-9 = Mild depression  Score 10-14 = Moderate depression  Score 15-19 = Moderately severe depression  Score 20-27 = Severe depression (2.4 times higher risk of SUD and 2.89 times higher risk of overuse)   Pharmacologic Plan: Pending ordered tests and/or consults  Meds  The patient has a current medication list which includes the following prescription(s): duloxetine, imipramine, vitamin d (ergocalciferol), benefiber, bisacodyl, and neurontin.  Current Outpatient Medications on File Prior to Visit  Medication Sig  . DULoxetine (CYMBALTA) 20 MG capsule Take 60 mg by mouth daily.   Marland Kitchen imipramine (TOFRANIL) 10 MG tablet Take 1 tablet (10 mg total) by mouth daily as needed (In case of chest pain due to esphogeal spasm).  . Vitamin D, Ergocalciferol, (DRISDOL) 50000 units CAPS capsule Take 1 capsule by mouth every morning.  . Wheat Dextrin (BENEFIBER) POWD Stir 2 tsp. TID into 4-8 oz of any non-carbonated beverage or soft food (hot or cold)  . bisacodyl (DULCOLAX) 5 MG EC tablet Take 2 tablets (10 mg total) by mouth at bedtime as needed for moderate constipation ((Hold for loose stool)).  Marland Kitchen NEURONTIN 300 MG capsule Take 1 capsule (300 mg total) by mouth 3 (three) times daily. In addition take 1-3 capsules at bedtime.   No current facility-administered medications on file prior to visit.    Imaging Review  Lumbosacral Imaging: Lumbar MR wo contrast:  Results for orders placed during the hospital encounter of 08/13/14  MR Lumbar Spine Wo Contrast   Narrative CLINICAL DATA:  42 year old female with muscular dystrophy. Left buttock pain since February which for 3 weeks has been radiating down the left  lower extremity medially, and medially to the groin for 1 week. Initial encounter.  EXAM: MRI LUMBAR SPINE WITHOUT CONTRAST  TECHNIQUE: Multiplanar, multisequence MR imaging of the lumbar spine was performed. No intravenous contrast was administered.  COMPARISON:  Lumbar radiographs 10/16/2013. Chest radiographs 10/14/2012.  FINDINGS: 12 full size ribs demonstrated in 2014. Normal lumbar segmentation other than a small S1-S2 disc space, but the S1 level is otherwise sacralized.  Mild retrolisthesis of L5 on S1. Mild straightening of upper lumbar lordosis. Otherwise normal vertebral height and alignment. No marrow edema or evidence of acute osseous abnormality.  Visualized lower thoracic spinal cord is normal with conus medularis at L1-L2.  Visualized abdominal viscera and paraspinal soft tissues are within normal limits.  T11-T12:  Negative aside from mild facet hypertrophy.  T12-L1:  Negative.  L1-L2:  Negative.  L2-L3:  Negative.  L3-L4:  Negative.  L4-L5: Mild disc desiccation and circumferential disc bulge. Mild facet and ligament flavum hypertrophy greater on the left. Superimposed small broad-based left foraminal disc protrusion with annular fissure best seen on series 2, image 12 and series 5, image 29. This is in proximity to the exiting left L4 nerve. No significant stenosis.  L5-S1: Mild disc desiccation and circumferential disc bulge. Subtle left subarticular disc protrusion with annular fissure best seen on series 2, image 11, also on series 5, image 35. No stenosis, but this finding is in proximity to the exiting left L5 and descending left S1 nerves. Superimposed mild facet hypertrophy.  IMPRESSION: 1. Mild L4-L5 and L5-S1 disc degeneration with small disc herniations at both levels which could be a source for left side L4 through S1 radiculitis as above. 2. No lumbar spinal stenosis, and other lumbar levels are negative.   Electronically  Signed   By: Genevie Ann M.D.   On: 08/13/2014 15:53    Note: Available results from prior imaging studies were reviewed.        ROS  Cardiovascular History: No reported cardiovascular signs or symptoms such as High blood pressure, coronary artery disease, abnormal heart rate or rhythm, heart attack, blood thinner therapy or heart weakness and/or failure Pulmonary or Respiratory History: Snoring  Neurological History: Incontinence:  Urinary Review of Past Neurological Studies: No results found for this or any previous visit. Psychological-Psychiatric History: Anxiousness and Difficulty sleeping and or falling asleep Gastrointestinal History: No reported gastrointestinal signs or symptoms such as vomiting or evacuating blood, reflux, heartburn, alternating episodes of diarrhea and constipation, inflamed or scarred liver, or pancreas or irrregular and/or infrequent bowel movements Genitourinary History: No reported renal or genitourinary signs or symptoms such as difficulty voiding or producing urine, peeing blood, non-functioning kidney, kidney stones, difficulty emptying the bladder, difficulty controlling the flow of urine, or chronic kidney disease Hematological History: No reported hematological signs or symptoms such as prolonged bleeding, low or poor functioning platelets, bruising or bleeding easily, hereditary bleeding problems, low energy levels due to low hemoglobin or being anemic Endocrine History: No reported endocrine signs or symptoms such as high or low blood sugar, rapid heart rate due to high thyroid levels, obesity or weight gain due to slow thyroid or thyroid disease Rheumatologic History: No reported rheumatological signs and symptoms such as fatigue, joint pain, tenderness, swelling, redness, heat, stiffness, decreased range of motion, with or without associated rash Musculoskeletal  History: Muscular dystrophy Work History: Disabled  Allergies  Ms. Greenwalt is allergic to tramadol  hcl; hydrocodone-acetaminophen; and morphine and related.  Laboratory Chemistry  Inflammation Markers Lab Results  Component Value Date   CRP 4.0 (H) 09/21/2015   ESRSEDRATE 29 (H) 09/21/2015   (CRP: Acute Phase) (ESR: Chronic Phase) Renal Function Markers Lab Results  Component Value Date   BUN 14 09/21/2015   CREATININE 0.65 09/21/2015   GFRAA >60 09/21/2015   GFRNONAA >60 09/21/2015   Hepatic Function Markers Lab Results  Component Value Date   AST 68 (H) 09/21/2015   ALT 70 (H) 09/21/2015   ALBUMIN 3.6 09/21/2015   ALKPHOS 132 (H) 09/21/2015   Electrolytes Lab Results  Component Value Date   NA 140 09/21/2015   K 3.5 09/21/2015   CL 105 09/21/2015   CALCIUM 9.9 09/21/2015   MG 2.0 09/21/2015   Neuropathy Markers Lab Results  Component Value Date   VITAMINB12 299 09/21/2015   Bone Pathology Markers Lab Results  Component Value Date   ALKPHOS 132 (H) 09/21/2015   25OHVITD1 14 (L) 09/21/2015   25OHVITD2 9.7 09/21/2015   25OHVITD3 4.3 09/21/2015   CALCIUM 9.9 09/21/2015   Coagulation Parameters Lab Results  Component Value Date   PLT 214 06/19/2014   Cardiovascular Markers Lab Results  Component Value Date   BNP 59 09/17/2013   HGB 14.1 06/19/2014   HCT 42.6 06/19/2014   Note: Lab results reviewed.  PFSH  Drug: Ms. Dezarn  reports that she does not use drugs. Alcohol:  reports that she does not drink alcohol. Tobacco:  reports that she has never smoked. She has never used smokeless tobacco. Medical:  has a past medical history of Anxiety, Broken ankle, Chest pain (05/04/2014), Depression, Fibroid, Hypercholesteremia, IBS (irritable bowel syndrome), MD (muscular dystrophy) ([place]), MD (muscular dystrophy) ([place]), Muscular dystrophy ([place]), and Vitamin D insufficiency (01/07/2015). Family: family history includes Arthritis in her mother; Diabetes in her mother and paternal grandmother; Drug abuse in her brother; Gout in her father; Hyperlipidemia in her  mother; Hypertension in her father; Mental illness in her mother; Muscular dystrophy in her mother and sister; Seizures in her mother; Stroke in her mother.  Past Surgical History:  Procedure Laterality Date  . Thermal avu    . TUBAL LIGATION     Active Ambulatory Problems    Diagnosis Date Noted  . Chronic pain 01/07/2015  . B12 deficiency 05/05/2014  . Clinical depression 05/04/2014  . Breathlessness on exertion 05/04/2014  . Fibroid uterus 11/10/2014  . GAD (generalized anxiety disorder) 01/07/2015  . H/O urinary tract infection 01/07/2015  . HLD (hyperlipidemia) 05/04/2014  . Hypertriglyceridemia 01/07/2015  . Adiposity 05/05/2014  . 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 Adynamia 05/05/2014  . Chest pain 05/04/2014  . Long term current use of opiate analgesic 01/07/2015  . Long term prescription opiate use 01/07/2015  . Opiate use (30 MME/Day) 01/07/2015  . Opiate dependence ([place]) 01/07/2015  . Encounter for therapeutic drug level monitoring 01/07/2015  . Chronic low back pain  (Location of Primary Source of Pain) (Bilateral) (L>R) 01/07/2015  . Lumbar spondylosis (L>R) 01/07/2015  . Chronic lower extremity pain (Location of Secondary source of pain) (Left) 01/07/2015  . Chronic lumbar radicular pain (left side) (L5 Dermatome) 01/07/2015  . 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 Diffuse myofascial pain syndrome 01/07/2015  . Musculoskeletal pain of upper extremity (Bilateral) (Proximal/Biceps muscles) 01/07/2015  . Chronic (intermittent) upper extremity  pain 01/07/2015  . Lumbar facet hypertrophy (Bilateral) (L>R) 01/07/2015  . Grade 1 Retrolisthesis of L5 over S1 01/07/2015  . Vitamin D deficiency 01/17/2015  . Pain of right arm 05/05/2014  . Myotonic dystrophy ([place]) 05/27/2013  . Neurogenic pain 06/29/2015  . Therapeutic opioid induced constipation 09/19/2015  . Esophagospasm 09/19/2015  . Abnormal uterine bleeding  09/19/2015  . Opioid-induced constipation (OIC) 09/21/2015  . Elevated liver enzymes 09/27/2015  . Elevated sedimentation rate 09/27/2015  . Elevated C-reactive protein (CRP) 09/27/2015  . Lumbar facet syndrome (Bilateral) (L>R) 12/27/2015  . History of depression 05/04/2014  . Mixed hyperlipidemia 05/04/2014  . Chronic bilateral low back pain with bilateral sciatica (Primary Area of Pain)( midline) 06/11/2017  . Chronic pain of both lower extremities (Secondary Area of Pain)(B)( (R>L)) 06/11/2017  . Chronic upper back pain [facility] Area of Pain)(R>L) 06/11/2017  . Pharmacologic therapy 06/11/2017  . Disorder of skeletal system 06/11/2017  . Problems influencing health status 06/11/2017   Resolved Ambulatory Problems    Diagnosis Date Noted  . No Resolved Ambulatory Problems   Past Medical History:  Diagnosis Date  . Anxiety   . Broken ankle   . Chest pain 05/04/2014  . Depression   . Fibroid   . Hypercholesteremia   . IBS (irritable bowel syndrome)   . MD (muscular dystrophy) ([place])   . MD (muscular dystrophy) ([place])   . Muscular dystrophy ([place])   . Vitamin D insufficiency 01/07/2015   Constitutional Exam  General appearance: Well nourished, well developed, and well hydrated. In no apparent acute distress Vitals:   06/11/17 1006  BP: 115/72  Pulse: 73  Resp: 16  Temp: 98.2 F (36.8 C)  SpO2: 95%  Weight: 195 lb (88.5 kg)  Height: 5' 6"  (1.676 m)   BMI Assessment: Estimated body mass index is 31.47 kg/m as calculated from the following:   Height as of this encounter: 5' 6"  (1.676 m).   Weight as of this encounter: 195 lb (88.5 kg).  BMI interpretation table: BMI level Category Range association with higher incidence of chronic pain  <18 kg/m2 Underweight   18.5-24.9 kg/m2 Ideal body weight   25-29.9 kg/m2 Overweight Increased incidence by 20%  30-34.9 kg/m2 Obese (Class I) Increased incidence by 68%  35-39.9 kg/m2 Severe obesity (Class II) Increased incidence by  136%  >40 kg/m2 Extreme obesity (Class III) Increased incidence by 254%   BMI Readings from Last 4 Encounters:  06/11/17 31.47 kg/m  03/18/17 31.00 kg/m  01/15/16 31.00 kg/m  12/27/15 31.00 kg/m   Wt Readings from Last 4 Encounters:  06/11/17 195 lb (88.5 kg)  03/18/17 195 lb (88.5 kg)  01/15/16 195 lb (88.5 kg)  12/27/15 195 lb (88.5 kg)  Psych/Mental status: Alert, oriented x 3 (person, place, & time)       Eyes: PERLA Respiratory: No evidence of acute respiratory distress  Cervical Spine Exam  Inspection: No masses, redness, or swelling Alignment: Symmetrical Functional ROM: Unrestricted ROM      Stability: No instability detected Muscle strength & Tone: Functionally intact Sensory: Unimpaired Palpation: No palpable anomalies              Upper Extremity (UE) Exam    Side: Right upper extremity  Side: Left upper extremity  Inspection: No masses, redness, swelling, or asymmetry. No contractures  Inspection: No masses, redness, swelling, or asymmetry. No contractures  Functional ROM: Unrestricted ROM          Functional ROM: Unrestricted ROM  Muscle strength & Tone: Functionally intact  Muscle strength & Tone: Functionally intact  Sensory: Unimpaired  Sensory: Unimpaired  Palpation: No palpable anomalies              Palpation: No palpable anomalies              Specialized Test(s): Deferred         Specialized Test(s): Deferred          Thoracic Spine Exam  Inspection: No masses, redness, or swelling Alignment: Symmetrical Functional ROM: Unrestricted ROM Stability: No instability detected Sensory: Unimpaired Muscle strength & Tone: No palpable anomalies  Lumbar Spine Exam  Inspection: No masses, redness, or swelling Alignment: Symmetrical Functional ROM: Unrestricted ROM      Stability: No instability detected Muscle strength & Tone: Functionally intact Sensory: Unimpaired Palpation: No palpable anomalies       Provocative Tests: Lumbar  Hyperextension and rotation test: evaluation deferred today       Patrick's Maneuver: evaluation deferred today                    Gait & Posture Assessment  Ambulation: Unassisted Gait: Relatively normal for age and body habitus Posture: WNL   Lower Extremity Exam    Side: Right lower extremity  Side: Left lower extremity  Inspection: No masses, redness, swelling, or asymmetry. No contractures  Inspection: No masses, redness, swelling, or asymmetry. No contractures  Functional ROM: Unrestricted ROM          Functional ROM: Unrestricted ROM          Muscle strength & Tone: Functionally intact  Muscle strength & Tone: Functionally intact  Sensory: Unimpaired  Sensory: Unimpaired  Palpation: No palpable anomalies  Palpation: No palpable anomalies   Assessment  Primary Diagnosis & Pertinent Problem List: The primary encounter diagnosis was Chronic bilateral low back pain with bilateral sciatica (Primary Area of Pain)( midline). Diagnoses of Chronic pain of both lower extremities (Secondary Area of Pain)(B)( (R>L)), Chronic upper back pain (Tertiary Area of Pain)(R>L), Chronic pain syndrome, Long term current use of opiate analgesic, Pharmacologic therapy, Disorder of skeletal system, and Problems influencing health status were also pertinent to this visit.  Visit Diagnosis: 1. Chronic bilateral low back pain with bilateral sciatica (Primary Area of Pain)( midline)   2. Chronic pain of both lower extremities (Secondary Area of Pain)(B)( (R>L))   3. Chronic upper back pain [facility] Area of Pain)(R>L)   4. Chronic pain syndrome   5. Long term current use of opiate analgesic   6. Pharmacologic therapy   7. Disorder of skeletal system   8. Problems influencing health status    Plan of Care  Initial treatment plan:  Please be advised that as per protocol, today's visit has been an evaluation only. We have not taken over the patient's controlled substance management.  Problem-specific  plan: No problem-specific Assessment & Plan notes found for this encounter.  Ordered Lab-work, Procedure(s), Referral(s), & Consult(s): Orders Placed This Encounter  Procedures  . DG Lumbar Spine Complete W/Bend  . DG Thoracic Spine 2 View  . Compliance Drug Analysis, Ur  . Magnesium  . Vitamin B12  . Sedimentation rate  . C-reactive protein   Pharmacotherapy: Medications ordered:  No orders of the defined types were placed in this encounter.  Medications administered during this visit: Jeiry Birnbaum. Silveri had no medications administered during this visit.   Pharmacotherapy under consideration:  Opioid Analgesics: The patient was informed that there is no  guarantee that she would be a candidate for opioid analgesics. The decision will be made following CDC guidelines. This decision will be based on the results of diagnostic studies, as well as Ms. Caratachea's risk profile.  Membrane stabilizer: To be determined at a later time Muscle relaxant: To be determined at a later time NSAID: To be determined at a later time Other analgesic(s): To be determined at a later time   Interventional therapies under consideration: Ms. Gwynne was informed that there is no guarantee that she would be a candidate for interventional therapies. The decision will be based on the results of diagnostic studies, as well as Ms. Weatherholtz's risk profile.  Possible procedure(s): Diagnostic bilateral lumbar epidural steroid injection Diagnostic bilateral lumbar facet nerve [place] Possible bilateral lumbar facet RFA Trigger point injections   Provider-requested follow-up: Return for 2nd Visit, w/ Dr. Dossie Arbour, xrays before return for second visit.  No future appointments.  Primary Care Physician: Dion Body, MD Location: [facility] Facility Note by:  Date: 06/11/2017; Time: 1:16 PM  Pain Score Disclaimer: We use the NRS-11 scale. This is a self-reported, subjective measurement of pain  severity with only modest accuracy. It is used primarily to identify changes within a particular patient. It must be understood that outpatient pain scales are significantly less accurate that those used for research, where they can be applied under ideal controlled circumstances with minimal exposure to variables. In reality, the score is likely to be a combination of pain intensity and pain affect, where pain affect describes the degree of emotional arousal or changes in action readiness caused by the sensory experience of pain. Factors such as social and work situation, setting, emotional state, anxiety levels, expectation, and prior pain experience may influence pain perception and show large inter-individual differences that may also be affected by time variables.  Patient instructions provided during this appointment: Patient Instructions   ____________________________________________________________________________________________  Appointment Policy Summary  It is our goal and responsibility to provide the medical community with assistance in the evaluation and management of patients with chronic pain. Unfortunately our resources are limited. Because we do not have an unlimited amount of time, or available appointments, we are required to closely monitor and manage their use. The following rules exist to maximize their use:  Patient's responsibilities: 1. Punctuality:  At what time should I arrive? You should be physically present in our office 30 minutes before your scheduled appointment. Your scheduled appointment is with your assigned healthcare provider. However, it takes 5-10 minutes to be "checked-in", and another 15 minutes for the nurses to do the admission. If you arrive to our office at the time you were given for your appointment, you will end up being at least 20-25 minutes late to your appointment with the provider. 2. Tardiness:  What happens if I arrive only a few minutes after  my scheduled appointment time? You will need to reschedule your appointment. The cutoff is your appointment time. This is why it is so important that you arrive at least 30 minutes before that appointment. If you have an appointment scheduled for 10:00 AM and you arrive at 10:01, you will be required to reschedule your appointment.  3. Plan ahead:  Always assume that you will encounter traffic on your way in. Plan for it. If you are dependent on a driver, make sure they understand these rules and the need to arrive early. 4. Other appointments and responsibilities:  Avoid scheduling any other appointments before or after your pain  clinic appointments.  5. Be prepared:  Write down everything that you need to discuss with your healthcare provider and give this information to the admitting nurse. Write down the medications that you will need refilled. Bring your pills and bottles (even the empty ones), to all of your appointments, except for those where a procedure is scheduled. 6. No children or pets:  Find someone to take care of them. It is not appropriate to bring them in. 7. Scheduling changes:  We request "advanced notification" of any changes or cancellations. 8. Advanced notification:  Defined as a time period of more than 24 hours prior to the originally scheduled appointment. This allows for the appointment to be offered to other patients. 9. Rescheduling:  When a visit is rescheduled, it will require the cancellation of the original appointment. For this reason they both fall within the category of "Cancellations".  10. Cancellations:  They require advanced notification. Any cancellation less than 24 hours before the  appointment will be recorded as a "No Show". 11. No Show:  Defined as an unkept appointment where the patient failed to notify or declare to the practice their intention or inability to keep the appointment.  Corrective process for repeat offenders:  1. Tardiness: Three (3)  episodes of rescheduling due to late arrivals will be recorded as one (1) "No Show". 2. Cancellation or reschedule: Three (3) cancellations or rescheduling will be recorded as one (1) "No Show". 3. "No Shows": Three (3) "No Shows" within a 12 month period will result in discharge from the practice. ____________________________________________________________________________________________  ____________________________________________________________________________________________  Pain Scale  Introduction: The pain score used by this practice is the Verbal Numerical Rating Scale (VNRS-11). This is an 11-point scale. It is for adults and children 10 years or older. There are significant differences in how the pain score is reported, used, and applied. Forget everything you learned in the past and learn this scoring system.  General Information: The scale should reflect your current level of pain. Unless you are specifically asked for the level of your worst pain, or your average pain. If you are asked for one of these two, then it should be understood that it is over the past 24 hours.  Basic Activities of Daily Living (ADL): Personal hygiene, dressing, eating, transferring, and using restroom.  Instructions: Most patients tend to report their level of pain as a combination of two factors, their physical pain and their psychosocial pain. This last one is also known as "suffering" and it is reflection of how physical pain affects you socially and psychologically. From now on, report them separately. From this point on, when asked to report your pain level, report only your physical pain. Use the following table for reference.  Pain Clinic Pain Levels (0-5/10)  Pain Level Score  Description  No Pain 0   Mild pain 1 Nagging, annoying, but does not interfere with basic activities of daily living (ADL). Patients are able to eat, bathe, get dressed, toileting (being able to get on and off the toilet  and perform personal hygiene functions), transfer (move in and out of bed or a chair without assistance), and maintain continence (able to control bladder and bowel functions). Blood pressure and heart rate are unaffected. A normal heart rate for a healthy adult ranges from 60 to 100 bpm (beats per minute).   Mild to moderate pain 2 Noticeable and distracting. Impossible to hide from other people. More frequent flare-ups. Still possible to adapt and function close to normal.  It can be very annoying and may have occasional stronger flare-ups. With discipline, patients may get used to it and adapt.   Moderate pain 3 Interferes significantly with activities of daily living (ADL). It becomes difficult to feed, bathe, get dressed, get on and off the toilet or to perform personal hygiene functions. Difficult to get in and out of bed or a chair without assistance. Very distracting. With effort, it can be ignored when deeply involved in activities.   Moderately severe pain 4 Impossible to ignore for more than a few minutes. With effort, patients may still be able to manage work or participate in some social activities. Very difficult to concentrate. Signs of autonomic nervous system discharge are evident: dilated pupils (mydriasis); mild sweating (diaphoresis); sleep interference. Heart rate becomes elevated (>115 bpm). Diastolic blood pressure (lower number) rises above 100 mmHg. Patients find relief in laying down and not moving.   Severe pain 5 Intense and extremely unpleasant. Associated with frowning face and frequent crying. Pain overwhelms the senses.  Ability to do any activity or maintain social relationships becomes significantly limited. Conversation becomes difficult. Pacing back and forth is common, as getting into a comfortable position is nearly impossible. Pain wakes you up from deep sleep. Physical signs will be obvious: pupillary dilation; increased sweating; goosebumps; brisk reflexes; cold,  clammy hands and feet; nausea, vomiting or dry heaves; loss of appetite; significant sleep disturbance with inability to fall asleep or to remain asleep. When persistent, significant weight loss is observed due to the complete loss of appetite and sleep deprivation.  Blood pressure and heart rate becomes significantly elevated. Caution: If elevated blood pressure triggers a pounding headache associated with blurred vision, then the patient should immediately seek attention at an urgent or emergency care unit, as these may be signs of an impending stroke.    Emergency Department Pain Levels (6-10/10)  Emergency Room Pain 6 Severely limiting. Requires emergency care and should not be seen or managed at an outpatient pain management facility. Communication becomes difficult and requires great effort. Assistance to reach the emergency department may be required. Facial flushing and profuse sweating along with potentially dangerous increases in heart rate and blood pressure will be evident.   Distressing pain 7 Self-care is very difficult. Assistance is required to transport, or use restroom. Assistance to reach the emergency department will be required. Tasks requiring coordination, such as bathing and getting dressed become very difficult.   Disabling pain 8 Self-care is no longer possible. At this level, pain is disabling. The individual is unable to do even the most "basic" activities such as walking, eating, bathing, dressing, transferring to a bed, or toileting. Fine motor skills are lost. It is difficult to think clearly.   Incapacitating pain 9 Pain becomes incapacitating. Thought processing is no longer possible. Difficult to remember your own name. Control of movement and coordination are lost.   The worst pain imaginable 10 At this level, most patients pass out from pain. When this level is reached, collapse of the autonomic nervous system occurs, leading to a sudden drop in blood pressure and  heart rate. This in turn results in a temporary and dramatic drop in blood flow to the brain, leading to a loss of consciousness. Fainting is one of the body's self defense mechanisms. Passing out puts the brain in a calmed state and causes it to shut down for a while, in order to begin the healing process.    Summary: 1. Refer to this scale when  providing [place] with your pain level. 2. Be accurate and careful when reporting your pain level. This will help with your care. 3. Over-reporting your pain level will lead to loss of credibility. 4. Even a level of 1/10 means that there is pain and will be treated at our facility. 5. High, inaccurate reporting will be documented as "Symptom Exaggeration", leading to loss of credibility and suspicions of possible secondary gains such as obtaining more narcotics, or wanting to appear disabled, for fraudulent reasons. 6. Only pain levels of 5 or below will be seen at our facility. 7. Pain levels of 6 and above will be sent to the Emergency Department and the appointment cancelled. ____________________________________________________________________________________________  BMI Assessment: Estimated body mass index is 31.47 kg/m as calculated from the following:   Height as of this encounter: 5' 6"  (1.676 m).   Weight as of this encounter: 195 lb (88.5 kg).  BMI interpretation table: BMI level Category Range association with higher incidence of chronic pain  <18 kg/m2 Underweight   18.5-24.9 kg/m2 Ideal body weight   25-29.9 kg/m2 Overweight Increased incidence by 20%  30-34.9 kg/m2 Obese (Class I) Increased incidence by 68%  35-39.9 kg/m2 Severe obesity (Class II) Increased incidence by 136%  >40 kg/m2 Extreme obesity (Class III) Increased incidence by 254%   BMI Readings from Last 4 Encounters:  06/11/17 31.47 kg/m  03/18/17 31.00 kg/m  01/15/16 31.00 kg/m  12/27/15 31.00 kg/m   Wt Readings from Last 4 Encounters:  06/11/17 195 lb (88.5 kg)   03/18/17 195 lb (88.5 kg)  01/15/16 195 lb (88.5 kg)  12/27/15 195 lb (88.5 kg)

## 2017-06-11 NOTE — Progress Notes (Signed)
Safety precautions to be maintained throughout the outpatient stay will include: orient to surroundings, keep bed in low position, maintain call bell within reach at all times, provide assistance with transfer out of bed and ambulation.  

## 2017-06-12 DIAGNOSIS — F11159 Opioid abuse with opioid-induced psychotic disorder, unspecified: Secondary | ICD-10-CM | POA: Diagnosis not present

## 2017-06-12 LAB — MAGNESIUM: Magnesium: 2.1 mg/dL (ref 1.6–2.3)

## 2017-06-12 LAB — C-REACTIVE PROTEIN: CRP: 1.4 mg/L (ref 0.0–4.9)

## 2017-06-12 LAB — SEDIMENTATION RATE: SED RATE: 12 mm/h (ref 0–32)

## 2017-06-12 LAB — VITAMIN B12: VITAMIN B 12: 298 pg/mL (ref 232–1245)

## 2017-06-13 NOTE — Progress Notes (Signed)
Results were reviewed and found to be: mildly abnormal  No acute injury or pathology identified  Review would suggest interventional pain management techniques may be of benefit 

## 2017-06-14 ENCOUNTER — Emergency Department: Payer: Medicare HMO

## 2017-06-14 ENCOUNTER — Other Ambulatory Visit: Payer: Self-pay

## 2017-06-14 ENCOUNTER — Encounter: Payer: Self-pay | Admitting: Emergency Medicine

## 2017-06-14 ENCOUNTER — Emergency Department
Admission: EM | Admit: 2017-06-14 | Discharge: 2017-06-14 | Disposition: A | Discharge status: Home / Self Care | Payer: Medicare HMO | Attending: Emergency Medicine | Admitting: Emergency Medicine

## 2017-06-14 DIAGNOSIS — G7100 Muscular dystrophy, unspecified: Secondary | ICD-10-CM | POA: Diagnosis not present

## 2017-06-14 DIAGNOSIS — S99921A Unspecified injury of right foot, initial encounter: Secondary | ICD-10-CM | POA: Diagnosis not present

## 2017-06-14 DIAGNOSIS — M79671 Pain in right foot: Secondary | ICD-10-CM | POA: Insufficient documentation

## 2017-06-14 DIAGNOSIS — M549 Dorsalgia, unspecified: Secondary | ICD-10-CM | POA: Diagnosis not present

## 2017-06-14 DIAGNOSIS — Z79899 Other long term (current) drug therapy: Secondary | ICD-10-CM | POA: Insufficient documentation

## 2017-06-14 DIAGNOSIS — M542 Cervicalgia: Secondary | ICD-10-CM | POA: Insufficient documentation

## 2017-06-14 DIAGNOSIS — W19XXXA Unspecified fall, initial encounter: Secondary | ICD-10-CM

## 2017-06-14 DIAGNOSIS — M546 Pain in thoracic spine: Secondary | ICD-10-CM | POA: Diagnosis not present

## 2017-06-14 DIAGNOSIS — S199XXA Unspecified injury of neck, initial encounter: Secondary | ICD-10-CM | POA: Diagnosis not present

## 2017-06-14 MED ORDER — METHOCARBAMOL 500 MG PO TABS: 500.0000 mg | ORAL_TABLET | Freq: Three times a day (TID) | ORAL | 0 refills | Status: AC | PRN

## 2017-06-14 MED ORDER — MELOXICAM 15 MG PO TABS: 15.0000 mg | ORAL_TABLET | Freq: Every day | ORAL | 1 refills | Status: AC

## 2017-06-14 NOTE — ED Provider Notes (Signed)
[facility] Emergency Department Provider Note  ____________________________________________  Time seen: Approximately 9:16 PM  I have reviewed the triage vital signs and the nursing notes.   HISTORY  Chief Complaint Fall    HPI Sheryl Barker is a 42 y.o. female with a history of muscular dystrophy presents to the emergency department after patient reports falling down a set of 4 steps earlier this evening.  Patient reports pain to upper back, neck and right foot.  Patient did not hit her head or lose consciousness.  She denies chest pain, chest tightness, shortness of breath, nausea, vomiting and abdominal pain.  No alleviating measures have been attempted.   Past Medical History:  Diagnosis Date  . Anxiety   . Broken ankle    right 11/16  . Chest pain 05/04/2014  . Depression   . Fibroid   . Hypercholesteremia   . IBS (irritable bowel syndrome)   . MD (muscular dystrophy) ([place])   . MD (muscular dystrophy) ([place])   . Muscular dystrophy ([place])   . Vitamin D insufficiency 01/07/2015    Patient Active Problem List   Diagnosis Date Noted  . Chronic bilateral low back pain with bilateral sciatica (Primary Area of Pain)( midline) 06/11/2017  . Chronic pain of both lower extremities (Secondary Area of Pain)(B)( (R>L)) 06/11/2017  . Chronic upper back pain [facility] Area of Pain)(R>L) 06/11/2017  . Pharmacologic therapy 06/11/2017  . Disorder of skeletal system 06/11/2017  . Problems influencing health status 06/11/2017  . Lumbar facet syndrome (Bilateral) (L>R) 12/27/2015  . Elevated liver enzymes 09/27/2015  . Elevated sedimentation rate 09/27/2015  . Elevated C-reactive protein (CRP) 09/27/2015  . Opioid-induced constipation (OIC) 09/21/2015  . Therapeutic opioid induced constipation 09/19/2015  . Esophagospasm 09/19/2015  . Abnormal uterine bleeding 09/19/2015  . Neurogenic pain 06/29/2015  . Vitamin D deficiency 01/17/2015  . Chronic pain  01/07/2015  . GAD (generalized anxiety disorder) 01/07/2015  . H/O urinary tract infection 01/07/2015  . Hypertriglyceridemia 01/07/2015  . 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 Long term current use of opiate analgesic 01/07/2015  . Long term prescription opiate use 01/07/2015  . Opiate use (30 MME/Day) 01/07/2015  . Opiate dependence ([place]) 01/07/2015  . Encounter for therapeutic drug level monitoring 01/07/2015  . Chronic low back pain  (Location of Primary Source of Pain) (Bilateral) (L>R) 01/07/2015  . Lumbar spondylosis (L>R) 01/07/2015  . Chronic lower extremity pain (Location of Secondary source of pain) (Left) 01/07/2015  . Chronic lumbar radicular pain (left side) (L5 Dermatome) 01/07/2015  . 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 Diffuse myofascial pain syndrome 01/07/2015  . Musculoskeletal pain of upper extremity (Bilateral) (Proximal/Biceps muscles) 01/07/2015  . Chronic (intermittent) upper extremity pain 01/07/2015  . Lumbar facet hypertrophy (Bilateral) (L>R) 01/07/2015  . Grade 1 Retrolisthesis of L5 over S1 01/07/2015  . Fibroid uterus 11/10/2014  . B12 deficiency 05/05/2014  . Adiposity 05/05/2014  . Adynamia 05/05/2014  . Pain of right arm 05/05/2014  . Clinical depression 05/04/2014  . Breathlessness on exertion 05/04/2014  . HLD (hyperlipidemia) 05/04/2014  . Chest pain 05/04/2014  . History of depression 05/04/2014  . Mixed hyperlipidemia 05/04/2014  . Myotonic dystrophy ([place]) 05/27/2013    Past Surgical History:  Procedure Laterality Date  . Thermal avu    . TUBAL LIGATION      Prior to Admission medications   Medication Sig Start Date End Date Taking? Authorizing Provider  bisacodyl (DULCOLAX) 5 MG  EC tablet Take 2 tablets (10 mg total) by mouth at bedtime as needed for moderate constipation ((Hold for loose stool)). 01/01/16 03/31/16  Milinda Pointer, MD  DULoxetine (CYMBALTA) 20 MG capsule  Take 60 mg by mouth daily.  09/19/15   [provider]  imipramine (TOFRANIL) 10 MG tablet Take 1 tablet (10 mg total) by mouth daily as needed (In case of chest pain due to esphogeal spasm). 09/21/15   Kathrine Haddock, NP  meloxicam (MOBIC) 15 MG tablet Take 1 tablet (15 mg total) by mouth daily for 7 days. 06/14/17 06/21/17  Lannie Fields, PA-C  methocarbamol (ROBAXIN) 500 MG tablet Take 1 tablet (500 mg total) by mouth every 8 (eight) hours as needed for up to 5 days for muscle spasms. 06/14/17 06/19/17  Lannie Fields, PA-C  NEURONTIN 300 MG capsule Take 1 capsule (300 mg total) by mouth 3 (three) times daily. In addition take 1-3 capsules at bedtime. 01/01/16 03/31/16  Milinda Pointer, MD  Vitamin D, Ergocalciferol, (DRISDOL) 50000 units CAPS capsule Take 1 capsule by mouth every morning. 06/06/17   [provider]  Wheat Dextrin (BENEFIBER) POWD Stir 2 tsp. TID into 4-8 oz of any non-carbonated beverage or soft food (hot or cold) 01/01/16   Milinda Pointer, MD    Allergies Tramadol; Tramadol hcl; Hydrocodone-acetaminophen; and Morphine and related  Family History  Problem Relation Age of Onset  . Muscular dystrophy Mother   . Stroke Mother   . Arthritis Mother   . Seizures Mother   . Hyperlipidemia Mother   . Mental illness Mother   . Diabetes Mother   . Hypertension Father   . Gout Father   . Muscular dystrophy Sister   . Drug abuse Brother   . Diabetes Paternal Grandmother     Social History Social History   Tobacco Use  . Smoking status: Never Smoker  . Smokeless tobacco: Never Used  Substance Use Topics  . Alcohol use: No  . Drug use: No     Review of Systems  Constitutional: No fever/chills Eyes: No visual changes. No discharge ENT: No upper respiratory complaints. Cardiovascular: no chest pain. Respiratory: no cough. No SOB. Gastrointestinal: No abdominal pain.  No nausea, no vomiting.  No diarrhea.  No constipation. Musculoskeletal: Patient  has neck, upper back and right foot pain.  Skin: Negative for rash, abrasions, lacerations, ecchymosis. Neurological: Negative for headaches, focal weakness or numbness.   ____________________________________________   PHYSICAL EXAM:  VITAL SIGNS: ED Triage Vitals [06/14/17 1953]  Enc Vitals Group     BP 118/60     Pulse Rate 87     Resp 18     Temp 98.7 F (37.1 C)     Temp Source Oral     SpO2 95 %     Weight 195 lb (88.5 kg)     Height 5\' 6"  (1.676 m)     Head Circumference      Peak Flow      Pain Score 9     Pain Loc      Pain Edu?      Excl. in [place]?      Constitutional: Alert and oriented. Well appearing and in no acute distress. Eyes: Conjunctivae are normal. PERRL. EOMI. Head: Atraumatic. ENT:      Ears: TMs are pearly.      Nose: No congestion/rhinnorhea.      Mouth/Throat: Mucous membranes are moist.  Neck: No stridor.  Patient has no midline cervical spine tenderness  to palpation.  Cardiovascular: Normal rate, regular rhythm. Normal S1 and S2.  Good peripheral circulation. Respiratory: Normal respiratory effort without tachypnea or retractions. Lungs CTAB. Good air entry to the bases with no decreased or absent breath sounds. Gastrointestinal: Bowel sounds 4 quadrants. Soft and nontender to palpation. No guarding or rigidity. No palpable masses. No distention. No CVA tenderness. Musculoskeletal: Full range of motion to all extremities. No gross deformities appreciated. Neurologic:  Normal speech and language. No gross focal neurologic deficits are appreciated.  Skin:  Skin is warm, dry and intact. No rash noted. Psychiatric: Mood and affect are normal. Speech and behavior are normal. Patient exhibits appropriate insight and judgement.   ____________________________________________   LABS (all labs ordered are listed, but only abnormal results are displayed)  Labs Reviewed - No data to  display ____________________________________________  EKG   ____________________________________________  RADIOLOGY Unk Pinto, personally viewed and evaluated these images (plain radiographs) as part of my medical decision making, as well as reviewing the written report by the radiologist.  Dg Cervical Spine 2-3 Views  Result Date: 06/14/2017 CLINICAL DATA:  Cervical and thoracic back pain after fall down stairs today. EXAM: CERVICAL SPINE - 2-3 VIEW COMPARISON:  None. FINDINGS: Cervical spine alignment is maintained. Vertebral body heights and intervertebral disc spaces are preserved. The dens is intact. Posterior elements appear well-aligned. There is no evidence of fracture. No prevertebral soft tissue edema. IMPRESSION: Negative cervical spine radiographs. Electronically Signed   By: Jeb Levering M.D.   On: 06/14/2017 21:01   Dg Thoracic Spine 2 View  Result Date: 06/14/2017 CLINICAL DATA:  Cervical and thoracic back pain after fall down stairs today. EXAM: THORACIC SPINE 2 VIEWS COMPARISON:  Thoracic spine radiographs 3 days prior 06/11/2017 FINDINGS: The alignment is maintained. Vertebral body heights are maintained. No evidence of acute fracture. No significant disc space narrowing. Posterior elements appear intact. There is no paravertebral soft tissue abnormality. IMPRESSION: Negative radiographs of the thoracic spine. Electronically Signed   By: Jeb Levering M.D.   On: 06/14/2017 21:00   Dg Foot Complete Right  Result Date: 06/14/2017 CLINICAL DATA:  Fall with right foot pain EXAM: RIGHT FOOT COMPLETE - 3+ VIEW COMPARISON:  None. FINDINGS: There is no evidence of fracture or dislocation. There is no evidence of arthropathy or other focal bone abnormality. Soft tissues are unremarkable. IMPRESSION: Negative. Electronically Signed   By: Donavan Foil M.D.   On: 06/14/2017 20:18    ____________________________________________    PROCEDURES  Procedure(s) performed:     Procedures    Medications - No data to display   ____________________________________________   INITIAL IMPRESSION / ASSESSMENT AND PLAN / ED COURSE  Pertinent labs & imaging results that were available during my care of the patient were reviewed by me and considered in my medical decision making (see chart for details).  Review of the [place] CSRS was performed in accordance of the [place] prior to dispensing any controlled drugs.    Assessment and plan Fall Patient presents to the emergency department with neck pain, upper back pain and right foot pain after patient reports that she fell down a set up 4 steps.  Overall physical exam is reassuring.  Original differential diagnosis included contusion versus fracture.  No acute fractures were identified on x-ray examination.  Patient was discharged with meloxicam and Robaxin.  Vital signs are reassuring prior to discharge.    ____________________________________________  FINAL CLINICAL IMPRESSION(S) / ED DIAGNOSES  Final diagnoses:  Fall, initial  encounter      NEW MEDICATIONS STARTED DURING THIS VISIT:  ED Discharge Orders        Ordered    meloxicam (MOBIC) 15 MG tablet  Daily     06/14/17 2112    methocarbamol (ROBAXIN) 500 MG tablet  Every 8 hours PRN     06/14/17 2112          This chart was dictated using voice recognition software/Dragon. Despite best efforts to proofread, errors can occur which can change the meaning. Any change was purely unintentional.    Karren Cobble 06/14/17 2126    Nena Polio, MD 06/14/17 2322

## 2017-06-14 NOTE — ED Triage Notes (Signed)
Pt arrives POV to triage with c/o mechanical fall down her steps. Pt is c/o generalized pain with emphasis on right foot and back but denies LOC or head trauma. Pt is in NAD.

## 2017-06-15 LAB — COMPLIANCE DRUG ANALYSIS, UR

## 2017-06-24 ENCOUNTER — Ambulatory Visit: Payer: Medicare HMO | Attending: Pain Medicine | Admitting: Pain Medicine

## 2017-06-24 ENCOUNTER — Other Ambulatory Visit: Payer: Self-pay

## 2017-06-24 ENCOUNTER — Encounter: Payer: Self-pay | Admitting: Pain Medicine

## 2017-06-24 VITALS — BP 131/89 | HR 71 | Temp 98.3°F | Resp 16 | Ht 66.0 in | Wt 195.0 lb

## 2017-06-24 DIAGNOSIS — M545 Low back pain: Secondary | ICD-10-CM | POA: Diagnosis not present

## 2017-06-24 DIAGNOSIS — M47817 Spondylosis without myelopathy or radiculopathy, lumbosacral region: Secondary | ICD-10-CM | POA: Diagnosis not present

## 2017-06-24 DIAGNOSIS — M47816 Spondylosis without myelopathy or radiculopathy, lumbar region: Secondary | ICD-10-CM

## 2017-06-24 DIAGNOSIS — G8929 Other chronic pain: Secondary | ICD-10-CM

## 2017-06-24 DIAGNOSIS — G894 Chronic pain syndrome: Secondary | ICD-10-CM | POA: Diagnosis not present

## 2017-06-24 MED ORDER — OXYCODONE HCL 5 MG PO TABS: 5.0000 mg | ORAL_TABLET | Freq: Four times a day (QID) | ORAL | 0 refills | Status: DC | PRN

## 2017-06-24 NOTE — Progress Notes (Signed)
Safety precautions to be maintained throughout the outpatient stay will include: orient to surroundings, keep bed in low position, maintain call bell within reach at all times, provide assistance with transfer out of bed and ambulation.  

## 2017-06-24 NOTE — Patient Instructions (Addendum)
____________________________________________________________________________________________  Pain Scale  Introduction: The pain score used by this practice is the Verbal Numerical Rating Scale (VNRS-11). This is an 11-point scale. It is for adults and children 10 years or older. There are significant differences in how the pain score is reported, used, and applied. Forget everything you learned in the past and learn this scoring system.  General Information: The scale should reflect your current level of pain. Unless you are specifically asked for the level of your worst pain, or your average pain. If you are asked for one of these two, then it should be understood that it is over the past 24 hours.  Basic Activities of Daily Living (ADL): Personal hygiene, dressing, eating, transferring, and using restroom.  Instructions: Most patients tend to report their level of pain as a combination of two factors, their physical pain and their psychosocial pain. This last one is also known as "suffering" and it is reflection of how physical pain affects you socially and psychologically. From now on, report them separately. From this point on, when asked to report your pain level, report only your physical pain. Use the following table for reference.  Pain Clinic Pain Levels (0-5/10)  Pain Level Score  Description  No Pain 0   Mild pain 1 Nagging, annoying, but does not interfere with basic activities of daily living (ADL). Patients are able to eat, bathe, get dressed, toileting (being able to get on and off the toilet and perform personal hygiene functions), transfer (move in and out of bed or a chair without assistance), and maintain continence (able to control bladder and bowel functions). Blood pressure and heart rate are unaffected. A normal heart rate for a healthy adult ranges from 60 to 100 bpm (beats per minute).   Mild to moderate pain 2 Noticeable and distracting. Impossible to hide from other  people. More frequent flare-ups. Still possible to adapt and function close to normal. It can be very annoying and may have occasional stronger flare-ups. With discipline, patients may get used to it and adapt.   Moderate pain 3 Interferes significantly with activities of daily living (ADL). It becomes difficult to feed, bathe, get dressed, get on and off the toilet or to perform personal hygiene functions. Difficult to get in and out of bed or a chair without assistance. Very distracting. With effort, it can be ignored when deeply involved in activities.   Moderately severe pain 4 Impossible to ignore for more than a few minutes. With effort, patients may still be able to manage work or participate in some social activities. Very difficult to concentrate. Signs of autonomic nervous system discharge are evident: dilated pupils (mydriasis); mild sweating (diaphoresis); sleep interference. Heart rate becomes elevated (>115 bpm). Diastolic blood pressure (lower number) rises above 100 mmHg. Patients find relief in laying down and not moving.   Severe pain 5 Intense and extremely unpleasant. Associated with frowning face and frequent crying. Pain overwhelms the senses.  Ability to do any activity or maintain social relationships becomes significantly limited. Conversation becomes difficult. Pacing back and forth is common, as getting into a comfortable position is nearly impossible. Pain wakes you up from deep sleep. Physical signs will be obvious: pupillary dilation; increased sweating; goosebumps; brisk reflexes; cold, clammy hands and feet; nausea, vomiting or dry heaves; loss of appetite; significant sleep disturbance with inability to fall asleep or to remain asleep. When persistent, significant weight loss is observed due to the complete loss of appetite and sleep deprivation.  Blood   pressure and heart rate becomes significantly elevated. Caution: If elevated blood pressure triggers a pounding headache  associated with blurred vision, then the patient should immediately seek attention at an urgent or emergency care unit, as these may be signs of an impending stroke.    Emergency Department Pain Levels (6-10/10)  Emergency Room Pain 6 Severely limiting. Requires emergency care and should not be seen or managed at an outpatient pain management facility. Communication becomes difficult and requires great effort. Assistance to reach the emergency department may be required. Facial flushing and profuse sweating along with potentially dangerous increases in heart rate and blood pressure will be evident.   Distressing pain 7 Self-care is very difficult. Assistance is required to transport, or use restroom. Assistance to reach the emergency department will be required. Tasks requiring coordination, such as bathing and getting dressed become very difficult.   Disabling pain 8 Self-care is no longer possible. At this level, pain is disabling. The individual is unable to do even the most "basic" activities such as walking, eating, bathing, dressing, transferring to a bed, or toileting. Fine motor skills are lost. It is difficult to think clearly.   Incapacitating pain 9 Pain becomes incapacitating. Thought processing is no longer possible. Difficult to remember your own name. Control of movement and coordination are lost.   The worst pain imaginable 10 At this level, most patients pass out from pain. When this level is reached, collapse of the autonomic nervous system occurs, leading to a sudden drop in blood pressure and heart rate. This in turn results in a temporary and dramatic drop in blood flow to the brain, leading to a loss of consciousness. Fainting is one of the body's self defense mechanisms. Passing out puts the brain in a calmed state and causes it to shut down for a while, in order to begin the healing process.    Summary: 1. Refer to this scale when providing [place] with your pain level. 2. Be  accurate and careful when reporting your pain level. This will help with your care. 3. Over-reporting your pain level will lead to loss of credibility. 4. Even a level of 1/10 means that there is pain and will be treated at our facility. 5. High, inaccurate reporting will be documented as "Symptom Exaggeration", leading to loss of credibility and suspicions of possible secondary gains such as obtaining more narcotics, or wanting to appear disabled, for fraudulent reasons. 6. Only pain levels of 5 or below will be seen at our facility. 7. Pain levels of 6 and above will be sent to the Emergency Department and the appointment cancelled. ____________________________________________________________________________________________   ____________________________________________________________________________________________  Pain Prevention Technique  Definition:   A technique used to minimize the effects of an activity known to cause inflammation or swelling, which in turn leads to an increase in pain.  Purpose: To prevent swelling from occurring. It is based on the fact that it is easier to prevent swelling from happening than it is to get rid of it, once it occurs.  Contraindications: 1. Anyone with allergy or hypersensitivity to the recommended medications. 2. Anyone taking anticoagulants (Blood Thinners) (e.g., Coumadin, Warfarin, Plavix, etc.). 3. Patients in Renal Failure.  Technique: Before you undertake an activity known to cause pain, or a flare-up of your chronic pain, and before you experience any pain, do the following:  1. On a full stomach, take 4 (four) over the counter Ibuprofens 200mg  tablets (Motrin), for a total of 800 mg. 2. In addition, take over  the counter Magnesium 400 to 500 mg, before doing the activity.  3. Six (6) hours later, again on a full stomach, repeat the Ibuprofen. 4. That night, take a warm shower and stretch under the running warm water.  This  technique may be sufficient to abort the pain and discomfort before it happens. Keep in mind that it takes a lot less medication to prevent swelling than it takes to eliminate it once it occurs.  ____________________________________________________________________________________________  ____________________________________________________________________________________________  Preparing for Procedure with Sedation  Instructions: . Oral Intake: Do not eat or drink anything for at least 8 hours prior to your procedure. . Transportation: Public transportation is not allowed. Bring an adult driver. The driver must be physically present in our waiting room before any procedure can be started. Marland Kitchen Physical Assistance: Bring an adult physically capable of assisting you, in the event you need help. This adult should keep you company at home for at least 6 hours after the procedure. . Blood Pressure Medicine: Take your blood pressure medicine with a sip of water the morning of the procedure. . Blood thinners:  . Diabetics on insulin: Notify the staff so that you can be scheduled 1st case in the morning. If your diabetes requires high dose insulin, take only  of your normal insulin dose the morning of the procedure and notify the staff that you have done so. . Preventing infections: Shower with an antibacterial soap the morning of your procedure. . Build-up your immune system: Take 1000 mg of Vitamin C with every meal (3 times a day) the day prior to your procedure. Marland Kitchen Antibiotics: Inform the staff if you have a condition or reason that requires you to take antibiotics before dental procedures. . Pregnancy: If you are pregnant, call and cancel the procedure. . Sickness: If you have a cold, fever, or any active infections, call and cancel the procedure. . Arrival: You must be in the facility at least 30 minutes prior to your scheduled procedure. . Children: Do not bring children with you. . Dress  appropriately: Bring dark clothing that you would not mind if they get stained. . Valuables: Do not bring any jewelry or valuables.  Procedure appointments are reserved for interventional treatments only. Marland Kitchen No Prescription Refills. . No medication changes will be discussed during procedure appointments. . No disability issues will be discussed.  Remember:  Regular Business hours are:  Monday to Thursday 8:00 AM to 4:00 PM  Provider's Schedule: Milinda Pointer, MD:  Procedure days: Tuesday and Thursday 7:30 AM to 4:00 PM  Gillis Santa, MD:  Procedure days: Monday and Wednesday 7:30 AM to 4:00 PM ____________________________________________________________________________________________   ____________________________________________________________________________________________  Medication Rules  Applies to: All patients receiving prescriptions (written or electronic).  Pharmacy of record: Pharmacy where electronic prescriptions will be sent. If written prescriptions are taken to a different pharmacy, please inform the nursing staff. The pharmacy listed in the electronic medical record should be the one where you would like electronic prescriptions to be sent.  Prescription refills: Only during scheduled appointments. Applies to both, written and electronic prescriptions.  NOTE: The following applies primarily to controlled substances (Opioid* Pain Medications).   Patient's responsibilities: 1. Pain Pills: Bring all pain pills to every appointment (except for procedure appointments). 2. Pill Bottles: Bring pills in original pharmacy bottle. Always bring newest bottle. Bring bottle, even if empty. 3. Medication refills: You are responsible for knowing and keeping track of what medications you need refilled. The day before your appointment, write a  list of all prescriptions that need to be refilled. Bring that list to your appointment and give it to the admitting nurse.  Prescriptions will be written only during appointments. If you forget a medication, it will not be "Called in", "Faxed", or "electronically sent". You will need to get another appointment to get these prescribed. 4. Prescription Accuracy: You are responsible for carefully inspecting your prescriptions before leaving our office. Have the discharge nurse carefully go over each prescription with you, before taking them home. Make sure that your name is accurately spelled, that your address is correct. Check the name and dose of your medication to make sure it is accurate. Check the number of pills, and the written instructions to make sure they are clear and accurate. Make sure that you are given enough medication to last until your next medication refill appointment. 5. Taking Medication: Take medication as prescribed. Never take more pills than instructed. Never take medication more frequently than prescribed. Taking less pills or less frequently is permitted and encouraged, when it comes to controlled substances (written prescriptions).  6. Inform other Doctors: Always inform, all of your healthcare providers, of all the medications you take. 7. Pain Medication from other Providers: You are not allowed to accept any additional pain medication from any other Doctor or Healthcare provider. There are two exceptions to this rule. (see below) In the event that you require additional pain medication, you are responsible for notifying us, as stated below. 8. Medication Agreement: You are responsible for carefully reading and following our Medication Agreement. This must be signed before receiving any prescriptions from our practice. Safely store a copy of your signed Agreement. Violations to the Agreement will result in no further prescriptions. (Additional copies of our Medication Agreement are available upon request.) 9. Laws, Rules, & Regulations: All patients are expected to follow all Federal and [employer],  [employer], Rules, [employer]. Ignorance of the Laws does not constitute a valid excuse. The use of any illegal substances is prohibited. 10. Adopted CDC guidelines & recommendations: Target dosing levels will be at or below 60 MME/day. Use of benzodiazepines** is not recommended.  Exceptions: There are only two exceptions to the rule of not receiving pain medications from other Healthcare Providers. 1. Exception #1 (Emergencies): In the event of an emergency (i.e.: accident requiring emergency care), you are allowed to receive additional pain medication. However, you are responsible for: As soon as you are able, call our office [phone], at any time of the day or night, and leave a message stating your name, the date and nature of the emergency, and the name and dose of the medication prescribed. In the event that your call is answered by a member of our staff, make sure to document and save the date, time, and the name of the person that took your information.  2. Exception #2 (Planned Surgery): In the event that you are scheduled by another doctor or dentist to have any type of surgery or procedure, you are allowed (for a period no longer than 30 days), to receive additional pain medication, for the acute post-op pain. However, in this case, you are responsible for picking up a copy of our "Post-op Pain Management for Surgeons" handout, and giving it to your surgeon or dentist. This document is available at our office, and does not require an appointment to obtain it. Simply go to our office during business hours (Monday-Thursday from 8:00 AM to 4:00 PM) (Friday 8:00 AM to 12:00 Noon)  or if you have a scheduled appointment with [place], prior to your surgery, and ask for it by name. In addition, you will need to provide [place] with your name, name of your surgeon, type of surgery, and date of procedure or surgery.  *Opioid medications include: morphine, codeine, oxycodone, oxymorphone, hydrocodone,  hydromorphone, meperidine, tramadol, tapentadol, buprenorphine, fentanyl, methadone. **Benzodiazepine medications include: diazepam (Valium), alprazolam (Xanax), clonazepam (Klonopine), lorazepam (Ativan), clorazepate (Tranxene), chlordiazepoxide (Librium), estazolam (Prosom), oxazepam (Serax), temazepam (Restoril), triazolam (Halcion) (Last updated: 05/02/2017) ____________________________________________________________________________________________   ____________________________________________________________________________________________  Medication Recommendations and Reminders  Applies to: All patients receiving prescriptions (written and/or electronic).  Medication Rules & Regulations: These rules and regulations exist for your safety and that of others. They are not flexible and neither are we. Dismissing or ignoring them will be considered "non-compliance" with medication therapy, resulting in complete and irreversible termination of such therapy. (See document titled "Medication Rules" for more details.) In all conscience, because of safety reasons, we cannot continue providing a therapy where the patient does not follow instructions.  Pharmacy of record:   Definition: This is the pharmacy where your electronic prescriptions will be sent.   We do not endorse any particular pharmacy.  You are not restricted in your choice of pharmacy.  The pharmacy listed in the electronic medical record should be the one where you want electronic prescriptions to be sent.  If you choose to change pharmacy, simply notify our nursing staff of your choice of new pharmacy.  Recommendations:  Keep all of your pain medications in a safe place, under lock and key, even if you live alone.   After you fill your prescription, take 1 week's worth of pills and put them away in a safe place. You should keep a separate, properly labeled bottle for this purpose. The remainder should be kept in the  original bottle. Use this as your primary supply, until it runs out. Once it's gone, then you know that you have 1 week's worth of medicine, and it is time to come in for a prescription refill. If you do this correctly, it is unlikely that you will ever run out of medicine.  To make sure that the above recommendation works, it is very important that you make sure your medication refill appointments are scheduled at least 1 week before you run out of medicine. To do this in an effective manner, make sure that you do not leave the office without scheduling your next medication management appointment. Always ask the nursing staff to show you in your prescription , when your medication will be running out. Then arrange for the receptionist to get you a return appointment, at least 7 days before you run out of medicine. Do not wait until you have 1 or 2 pills left, to come in. This is very poor planning and does not take into consideration that we may need to cancel appointments due to bad weather, sickness, or emergencies affecting our staff.  Prescription refills and/or changes in medication(s):   Prescription refills, and/or changes in dose or medication, will be conducted only during scheduled medication management appointments. (Applies to both, written and electronic prescriptions.)  No refills on procedure days. No medication will be changed or started on procedure days. No changes, adjustments, and/or refills will be conducted on a procedure day. Doing so will interfere with the diagnostic portion of the procedure.  No phone refills. No medications will be "called into the pharmacy".  No Fax refills.  No weekend refills.  No Holliday refills.  No after hours refills.  Remember:  Business hours are:  Monday to Thursday 8:00 AM to 4:00 PM Provider's Schedule: Dionisio David, NP - Appointments are:  Medication management: Monday to Thursday 8:00 AM to 4:00 PM Milinda Pointer, MD -  Appointments are:  Medication management: Monday and Wednesday 8:00 AM to 4:00 PM Procedure day: Tuesday and Thursday 7:30 AM to 4:00 PM Gillis Santa, MD - Appointments are:  Medication management: Tuesday and Thursday 8:00 AM to 4:00 PM Procedure day: Monday and Wednesday 7:30 AM to 4:00 PM (Last update: 05/02/2017) ____________________________________________________________________________________________   ____________________________________________________________________________________________  CANNABIDIOL (AKA: CBD Oil or Pills)  Applies to: All patients receiving prescriptions of controlled substances (written and/or electronic).  General Information: Cannabidiol (CBD) was discovered in 80. It is one of some 113 identified cannabinoids in cannabis (Marijuana) plants, accounting for up to 40% of the plant's extract. As of 2018, preliminary clinical research on cannabidiol included studies of anxiety, cognition, movement disorders, and pain.  Cannabidiol is consummed in multiple ways, including inhalation of cannabis smoke or vapor, as an aerosol spray into the cheek, and by mouth. It may be supplied as CBD oil containing CBD as the active ingredient (no added tetrahydrocannabinol (THC) or terpenes), a full-plant CBD-dominant hemp extract oil, capsules, dried cannabis, or as a liquid solution. CBD is thought not have the same psychoactivity as THC, and may affect the actions of THC. Studies suggest that CBD may interact with different biological targets, including cannabinoid receptors and other neurotransmitter receptors. As of 2018 the mechanism of action for its biological effects has not been determined.  In the [place], cannabidiol has a limited approval by the Food and Drug Administration (FDA) for treatment of only two types of epilepsy disorders. The side effects of long-term use of the drug include somnolence, decreased appetite, diarrhea, fatigue, malaise, weakness,  sleeping problems, and others.  CBD remains a Schedule I drug prohibited for any use.  Legality: Some manufacturers ship CBD products nationally, an illegal action which the FDA has not enforced in 2018, with CBD remaining the subject of an FDA investigational new drug evaluation, and is not considered legal as a dietary supplement or food ingredient as of December 2018. Federal illegality has made it difficult historically to conduct research on CBD. CBD is openly sold in head shops and health food stores in some states where such sales have not been explicitly legalized.  Warning: Because it is not FDA approved for general use or treatment of pain, it is not required to undergo the same manufacturing controls as prescription drugs.  This means that the available cannabidiol (CBD) may be contaminated with THC.  If this is the case, it will trigger a positive urine drug screen (UDS) test for cannabinoids (Marijuana).  Because a positive UDS for illicit substances is a violation of our medication agreement, your opioid analgesics (pain medicine) may be permanently discontinued. (Last update: 05/23/2017) ____________________________________________________________________________________________  Dennis Bast were given one presription for Oxycodone today.

## 2017-06-24 NOTE — Progress Notes (Signed)
Patient's Name: Sheryl Barker  MRN: [id number]  Referring Provider: Dion Body, MD  DOB: 1975/08/02  PCP: Dion Body, MD  DOS: 06/24/2017  Note by: Gaspar Cola, MD  Service setting: Ambulatory outpatient  Specialty: Interventional Pain Management  Location: [facility] (AMB) Pain Management Facility    Patient type: Established   Primary Reason(s) for Visit: Encounter for evaluation before starting new chronic pain management plan of care (Level of risk: moderate) CC: Back Pain (lower)  HPI  Sheryl Barker is a 42 y.o. year old, female patient, who comes today for a follow-up evaluation to review the test results and decide on a treatment plan. 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[place]); Adynamia; Chest pain; Long term current use of opiate analgesic; Long term prescription opiate use; Opiate use (30 MME/Day); Opiate dependence ([place]); Encounter for therapeutic drug level monitoring; Chronic low back pain  (Primary Source of Pain) (Bilateral) (L>R); Lumbar spondylosis (L>R); Chronic lower extremity pain (Left); Chronic lumbar radicular pain (L5 Dermatome) (Left); Myotonic dystrophy, 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); Diffuse myofascial pain syndrome; Musculoskeletal pain of upper extremity (Bilateral) (Proximal/Biceps muscles); Chronic (intermittent) upper extremity pain (Right); Lumbar facet hypertrophy (Bilateral) (L>R); Grade 1 Retrolisthesis of L5 over S1; Vitamin D deficiency; Myotonic dystrophy ([place]); Neurogenic pain; Therapeutic opioid induced constipation; Esophagospasm; Abnormal uterine bleeding; Opioid-induced constipation (OIC); Elevated liver enzymes; Elevated sedimentation rate; Elevated C-reactive protein (CRP); Lumbar facet syndrome (Bilateral) (L>R); History of  depression; Mixed hyperlipidemia; Chronic lower extremity pain (Secondary Area of Pain) (Bilateral) ( (R>L); Chronic upper back pain [facility] Area of Pain) (Bilateral) (R>L); Pharmacologic therapy; Disorder of skeletal system; Problems influencing health status; Chronic pain syndrome; and Spondylosis without myelopathy or radiculopathy, lumbosacral region on their problem list. Her primarily concern today is the Back Pain (lower)  Pain Assessment: Location: Lower Back Radiating: both legs to mid calf, worse in left leg Onset: More than a month ago Duration: Chronic pain Quality: Constant, Aching Severity: 7 /10 (self-reported pain score)  Note: Reported level is inconsistent with clinical observations. Clinically the patient looks like a 2/10 A 2/10 is viewed as "Mild to Moderate" and described as noticeable and distracting. Impossible to hide from other people. More frequent flare-ups. Still possible to adapt and function close to normal. It can be very annoying and may have occasional stronger flare-ups. With discipline, patients may get used to it and adapt. Information on the proper use of the pain scale provided to the patient today. When using our objective Pain Scale, levels between 6 and 10/10 are said to belong in an emergency room, as it progressively worsens from a 6/10, described as severely limiting, requiring emergency care not usually available at an outpatient pain management facility. At a 6/10 level, communication becomes difficult and requires great effort. Assistance to reach the emergency department may be required. Facial flushing and profuse sweating along with potentially dangerous increases in heart rate and blood pressure will be evident. Timing: Constant Modifying factors: injections  Sheryl Barker comes in today for a follow-up visit after her initial evaluation on 06/11/2017. Today we went over the results of her tests. These were explained in "Layman's terms". During today's  appointment we went over my diagnostic impression, as well as the proposed treatment plan.  According to the patient her primary area of pain is in her lower back. She describes it as being midline. She  denies previous accident or injury. She admits that she was a patient of mine in 2017. She had lumbar facet nerve blocks x2 which were effective.  The last one was done on 12/27/2015 and it provided her with 100% relief of the pain until July of the following year.  Initially our plan was to have her come back for a radiofrequency.  However, because she did obtain such good relief with the second diagnostic injection, we will plan on simply repeating the [place] and see how long it lasted again.  She admits that she later moved to [place]. She admits that physical therapy is not effective he only makes her pain worse secondary to her muscular dystrophy. She denies any recent images.  Her second area of pain is in her lower extremities. She admits that she has occasional pain in her buttocks that goes down to the mid calf. She admits that the pain rotates from the back around to the front and into the back of her calf. She denies any numbness or tingling. She describes it as an aching pain. She denies previous nerve conduction study.  Her third area of pain is in her upper back. She describes it as being midline. She admits that maybe the right is greater than the left. She describes the pain as being in between her upper shoulders. She denies any previous surgery. She denies any previous treatment, or recent images.  In considering the treatment plan options, Sheryl Barker was reminded that I no longer take patients for medication management only. I asked her to let me know if she had no intention of taking advantage of the interventional therapies, so that we could make arrangements to provide this space to someone interested. I also made it clear that undergoing interventional therapies for the purpose of  getting pain medications is very inappropriate on the part of a patient, and it will not be tolerated in this practice. This type of behavior would suggest true addiction and therefore it requires referral to an addiction specialist.   Further details on both, my assessment(s), as well as the proposed treatment plan, please see below.  Post-Procedure Assessment  Visit date not found Procedure: Diagnostic bilateral lumbar facet Jefferys #2 under fluoroscopic guidance and IV sedation Pre-procedure pain score:  4/10 Post-procedure pain score: 0/10 (100% relief) Influential Factors: BMI: 31.47 kg/m Intra-procedural challenges: None observed.         Assessment challenges: None detected.              Reported side-effects: None.        Post-procedural adverse reactions or complications: None reported         Sedation: Sedation provided. When no sedatives are used, the analgesic levels obtained are directly associated to the effectiveness of the local anesthetics. However, when sedation is provided, the level of analgesia obtained during the initial 1 hour following the intervention, is believed to be the result of a combination of factors. These factors may include, but are not limited to: 1. The effectiveness of the local anesthetics used. 2. The effects of the analgesic(s) and/or anxiolytic(s) used. 3. The degree of discomfort experienced by the patient at the time of the procedure. 4. The patients ability and reliability in recalling and recording the events. 5. The presence and influence of possible secondary gains and/or psychosocial factors. Reported result: Relief experienced during the 1st hour after the procedure: 100 % (Ultra-Short Term Relief) Ms. Morr has indicated area to have been numb during  this time. Interpretative annotation: Clinically appropriate result. Analgesia during this period is likely to be Local Anesthetic and/or IV Sedative (Analgesic/Anxiolytic) related.           Effects of local anesthetic: The analgesic effects attained during this period are directly associated to the localized infiltration of local anesthetics and therefore cary significant diagnostic value as to the etiological location, or anatomical origin, of the pain. Expected duration of relief is directly dependent on the pharmacodynamics of the local anesthetic used. Long-acting (4-6 hours) anesthetics used.  Reported result: Relief during the next 4 to 6 hour after the procedure: 100 % (Short-Term Relief) Ms. Gholson has indicated area to have been numb during this time. Interpretative annotation: Clinically appropriate result. Analgesia during this period is likely to be Local Anesthetic-related.          Long-term benefit: Defined as the period of time past the expected duration of local anesthetics (1 hour for short-acting and 4-6 hours for long-acting). With the possible exception of prolonged sympathetic blockade from the local anesthetics, benefits during this period are typically attributed to, or associated with, other factors such as analgesic sensory neuropraxia, antiinflammatory effects, or beneficial biochemical changes provided by agents other than the local anesthetics.  Reported result: Extended relief following procedure: 100 % (Long-Term Relief) Ms. Atlas reports that both, extremity and the axial pain improved with the treatment. Interpretative annotation: Clinically possible results. Good relief. Therapeutic success. Inflammation plays a part in the etiology to the pain. Benefit believed to be steroid-related.  Current benefits: Defined as reported results that persistent at this point in time.   Analgesia: 100 % Ms. Dowell reports that both, extremity and the axial pain improved with the treatment. The patient indicates that this 100% relief of her pain lasted from 12/27/2015 until around 08/2016.  Unfortunately, the pain has now returned. Function: Ms. Bundick reports improvement in  function ROM: Ms. Hautala reports improvement in ROM Interpretative annotation: Complete relief. Therapeutic success. Effective therapeutic approach. Benefit could be steroid-related.  Interpretation: Results would suggest a successful therapeutic intervention.                  Plan:  Proceed with treatment No.: 3           Controlled Substance Pharmacotherapy Assessment REMS (Risk Evaluation and Mitigation Strategy)  Analgesic: Oxycodone IR 5 mg to be taken 1 tablet p.o. every 6 hours PRN (#10 tablets must last her 30 days) Highest recorded MME/day: 50 mg/day MME/day: 0-30 mg/day Pill Count: None expected due to no prior prescriptions written by our practice. Landis Martins, RN  06/24/2017 10:29 AM  Sign at close encounter Safety precautions to be maintained throughout the outpatient stay will include: orient to surroundings, keep bed in low position, maintain call bell within reach at all times, provide assistance with transfer out of bed and ambulation.    Pharmacokinetics: Liberation and absorption (onset of action): WNL Distribution (time to peak effect): WNL Metabolism and excretion (duration of action): WNL         Pharmacodynamics: Desired effects: Analgesia: Ms. Crago reports >50% benefit. Functional ability: Patient reports that medication allows her to accomplish basic ADLs Clinically meaningful improvement in function (CMIF): Sustained CMIF goals met Perceived effectiveness: Described as relatively effective, allowing for increase in activities of daily living (ADL) Undesirable effects: Side-effects or Adverse reactions: None reported Monitoring: [place] PMP: Online review of the past 16-monthperiod previously conducted. Not applicable at this point since we have not taken over  the patient's medication management yet. List of all UDS test(s) done:  Lab Results  Component Value Date   TOXASSSELUR FINAL 09/21/2015   TOXASSSELUR FINAL 06/29/2015   TOXASSSELUR FINAL 04/04/2015    TOXASSSELUR FINAL 01/07/2015   SUMMARY FINAL 06/11/2017   Last UDS on record: ToxAssure Select 13  Date Value Ref Range Status  09/21/2015 FINAL  Final    Comment:    ==================================================================== TOXASSURE SELECT 13 (MW) ==================================================================== Test                             Result       Flag       Units Drug Present and Declared for Prescription Verification   Oxycodone                      1060         EXPECTED   ng/mg creat   Oxymorphone                    3430         EXPECTED   ng/mg creat   Noroxycodone                   763          EXPECTED   ng/mg creat   Noroxymorphone                 553          EXPECTED   ng/mg creat    Sources of oxycodone are scheduled prescription medications.    Oxymorphone, noroxycodone, and noroxymorphone are expected    metabolites of oxycodone. Oxymorphone is also available as a    scheduled prescription medication. ==================================================================== Test                      Result    Flag   Units      Ref Range   Creatinine              131              mg/dL      >=20 ==================================================================== Declared Medications:  The flagging and interpretation on this report are based on the  following declared medications.  Unexpected results may arise from  inaccuracies in the declared medications.  **Note: The testing scope of this panel includes these medications:  Oxycodone  **Note: The testing scope of this panel does not include following  reported medications:  Gabapentin (Neurontin) ==================================================================== For clinical consultation, please call [phone]. ====================================================================    Summary  Date Value Ref Range Status  06/11/2017 FINAL  Final    Comment:     ==================================================================== TOXASSURE COMP DRUG ANALYSIS,UR ==================================================================== Test                             Result       Flag       Units Drug Absent but Declared for Prescription Verification   Oxycodone                      Not Detected UNEXPECTED ng/mg creat   Gabapentin                     Not Detected UNEXPECTED   Duloxetine  Not Detected UNEXPECTED   Imipramine                     Not Detected UNEXPECTED ==================================================================== Test                      Result    Flag   Units      Ref Range   Creatinine              77               mg/dL      >=20 ==================================================================== Declared Medications:  The flagging and interpretation on this report are based on the  following declared medications.  Unexpected results may arise from  inaccuracies in the declared medications.  **Note: The testing scope of this panel includes these medications:  Duloxetine  Gabapentin  Imipramine  Oxycodone  **Note: The testing scope of this panel does not include following  reported medications:  Docusate (Dulcolax)  Supplement  Vitamin D2 (Ergocalciferol) ==================================================================== For clinical consultation, please call [phone]. ====================================================================    UDS interpretation: No unexpected findings.          Medication Assessment Form: Patient introduced to form today Treatment compliance: Treatment may start today if patient agrees with proposed plan. Evaluation of compliance is not applicable at this point Risk Assessment Profile: Aberrant behavior: See initial evaluations. None observed or detected today Comorbid factors increasing risk of overdose: See initial evaluation. No additional risks detected  today Medical Psychology Evaluation: Please see scanned results in medical record. Opioid Risk Tool - 06/11/17 1021      Family History of Substance Abuse   Alcohol  Negative    Illegal Drugs  Negative    Rx Drugs  Negative      Personal History of Substance Abuse   Alcohol  Negative    Illegal Drugs  Negative    Rx Drugs  Negative      Psychological Disease   Psychological Disease  Negative    Depression  Positive anxiety   anxiety     Total Score   Opioid Risk Tool Scoring  1    Opioid Risk Interpretation  Low Risk      ORT Scoring interpretation table:  Score <3 = Low Risk for SUD  Score between 4-7 = Moderate Risk for SUD  Score >8 = High Risk for Opioid Abuse   Risk Mitigation Strategies:  Patient opioid safety counseling: Completed today. Counseling provided to patient as per "Patient Counseling Document". Document signed by patient, attesting to counseling and understanding Patient-Prescriber Agreement (PPA): Obtained today.  Controlled substance notification to other providers: Written and sent today.  Pharmacologic Plan: Today we may be taking over the patient's pharmacological regimen. See below.             Laboratory Chemistry  Inflammation Markers (CRP: Acute Phase) (ESR: Chronic Phase) Lab Results  Component Value Date   CRP 1.4 06/11/2017   ESRSEDRATE 12 06/11/2017                         Renal Function Markers Lab Results  Component Value Date   BUN 14 09/21/2015   CREATININE 0.65 09/21/2015   GFRAA >60 09/21/2015   GFRNONAA >60 09/21/2015                              Hepatic  Function Markers Lab Results  Component Value Date   AST 68 (H) 09/21/2015   ALT 70 (H) 09/21/2015   ALBUMIN 3.6 09/21/2015   ALKPHOS 132 (H) 09/21/2015   LIPASE 29 12/20/2013                        Electrolytes Lab Results  Component Value Date   NA 140 09/21/2015   K 3.5 09/21/2015   CL 105 09/21/2015   CALCIUM 9.9 09/21/2015   MG 2.1 06/11/2017                         Neuropathy Markers Lab Results  Component Value Date   VITAMINB12 298 06/11/2017                        Bone Pathology Markers Lab Results  Component Value Date   25OHVITD1 14 (L) 09/21/2015   25OHVITD2 9.7 09/21/2015   25OHVITD3 4.3 09/21/2015                         Coagulation Parameters Lab Results  Component Value Date   PLT 214 06/19/2014                        Cardiovascular Markers Lab Results  Component Value Date   BNP 59 09/17/2013   TROPONINI <0.03 06/19/2014   HGB 14.1 06/19/2014   HCT 42.6 06/19/2014                         Note: Lab results reviewed.  Recent Diagnostic Imaging Review  Cervical Imaging: Cervical DG 2-3 views:  Results for orders placed during the hospital encounter of 06/14/17  DG Cervical Spine 2-3 Views   Narrative CLINICAL DATA:  Cervical and thoracic back pain after fall down stairs today.  EXAM: CERVICAL SPINE - 2-3 VIEW  COMPARISON:  None.  FINDINGS: Cervical spine alignment is maintained. Vertebral body heights and intervertebral disc spaces are preserved. The dens is intact. Posterior elements appear well-aligned. There is no evidence of fracture. No prevertebral soft tissue edema.  IMPRESSION: Negative cervical spine radiographs.   Electronically Signed   By: Jeb Levering M.D.   On: 06/14/2017 21:01    Thoracic Imaging: Thoracic DG 2-3 views:  Results for orders placed during the hospital encounter of 06/14/17  DG Thoracic Spine 2 View   Narrative CLINICAL DATA:  Cervical and thoracic back pain after fall down stairs today.  EXAM: THORACIC SPINE 2 VIEWS  COMPARISON:  Thoracic spine radiographs 3 days prior 06/11/2017  FINDINGS: The alignment is maintained. Vertebral body heights are maintained. No evidence of acute fracture. No significant disc space narrowing. Posterior elements appear intact. There is no paravertebral soft tissue abnormality.  IMPRESSION: Negative radiographs of  the thoracic spine.   Electronically Signed   By: Jeb Levering M.D.   On: 06/14/2017 21:00    Lumbosacral Imaging: Lumbar MR wo contrast:  Results for orders placed during the hospital encounter of 08/13/14  MR Lumbar Spine Wo Contrast   Narrative CLINICAL DATA:  42 year old female with muscular dystrophy. Left buttock pain since February which for 3 weeks has been radiating down the left lower extremity medially, and medially to the groin for 1 week. Initial encounter.  EXAM: MRI LUMBAR SPINE WITHOUT CONTRAST  TECHNIQUE: Multiplanar, multisequence MR imaging of the  lumbar spine was performed. No intravenous contrast was administered.  COMPARISON:  Lumbar radiographs 10/16/2013. Chest radiographs 10/14/2012.  FINDINGS: 12 full size ribs demonstrated in 2014. Normal lumbar segmentation other than a small S1-S2 disc space, but the S1 level is otherwise sacralized.  Mild retrolisthesis of L5 on S1. Mild straightening of upper lumbar lordosis. Otherwise normal vertebral height and alignment. No marrow edema or evidence of acute osseous abnormality.  Visualized lower thoracic spinal cord is normal with conus medularis at L1-L2.  Visualized abdominal viscera and paraspinal soft tissues are within normal limits.  T11-T12:  Negative aside from mild facet hypertrophy.  T12-L1:  Negative.  L1-L2:  Negative.  L2-L3:  Negative.  L3-L4:  Negative.  L4-L5: Mild disc desiccation and circumferential disc bulge. Mild facet and ligament flavum hypertrophy greater on the left. Superimposed small broad-based left foraminal disc protrusion with annular fissure best seen on series 2, image 12 and series 5, image 29. This is in proximity to the exiting left L4 nerve. No significant stenosis.  L5-S1: Mild disc desiccation and circumferential disc bulge. Subtle left subarticular disc protrusion with annular fissure best seen on series 2, image 11, also on series 5, image 35.  No stenosis, but this finding is in proximity to the exiting left L5 and descending left S1 nerves. Superimposed mild facet hypertrophy.  IMPRESSION: 1. Mild L4-L5 and L5-S1 disc degeneration with small disc herniations at both levels which could be a source for left side L4 through S1 radiculitis as above. 2. No lumbar spinal stenosis, and other lumbar levels are negative.   Electronically Signed   By: Genevie Ann M.D.   On: 08/13/2014 15:53    Lumbar DG Bending views:  Results for orders placed during the hospital encounter of 06/11/17  DG Lumbar Spine Complete W/Bend   Narrative CLINICAL DATA:  Low back pain.  BILATERAL sciatica.  EXAM: LUMBAR SPINE - COMPLETE WITH BENDING VIEWS  COMPARISON:  MRI lumbar spine 08/13/2014.  FINDINGS: There is no evidence of lumbar spine fracture. Alignment is normal. Intervertebral disc spaces are maintained. Flexion extension views demonstrate no abnormal movement. Lower lumbar facet arthropathy without pars defects noted at L4-5 and L5-S1.  IMPRESSION: Preserved intervertebral disc height. Lower lumbar facet arthropathy. Normal alignment without dynamic instability.   Electronically Signed   By: Staci Righter M.D.   On: 06/11/2017 16:37    Foot Imaging: Foot-R DG Complete:  Results for orders placed during the hospital encounter of 06/14/17  DG Foot Complete Right   Narrative CLINICAL DATA:  Fall with right foot pain  EXAM: RIGHT FOOT COMPLETE - 3+ VIEW  COMPARISON:  None.  FINDINGS: There is no evidence of fracture or dislocation. There is no evidence of arthropathy or other focal bone abnormality. Soft tissues are unremarkable.  IMPRESSION: Negative.   Electronically Signed   By: Donavan Foil M.D.   On: 06/14/2017 20:18    Complexity Note: Imaging results reviewed. Results shared with Ms. Henegar, using Layman's terms.                         Meds   Current Outpatient Medications:  .  DULoxetine (CYMBALTA) 20  MG capsule, Take 60 mg by mouth daily. , Disp: , Rfl: 0 .  oxyCODONE (OXY IR/ROXICODONE) 5 MG immediate release tablet, Take 1 tablet (5 mg total) by mouth every 6 (six) hours as needed for severe pain. Must last 30 days., Disp: 10 tablet, Rfl: 0 .  Vitamin D, Ergocalciferol, (DRISDOL) 50000 units CAPS capsule, Take 1 capsule by mouth every morning., Disp: , Rfl: 3 .  bisacodyl (DULCOLAX) 5 MG EC tablet, Take 2 tablets (10 mg total) by mouth at bedtime as needed for moderate constipation ((Hold for loose stool))., Disp: 100 tablet, Rfl: 0 .  NEURONTIN 300 MG capsule, Take 1 capsule (300 mg total) by mouth 3 (three) times daily. In addition take 1-3 capsules at bedtime., Disp: 180 capsule, Rfl: 2 .  Wheat Dextrin (BENEFIBER) POWD, Stir 2 tsp. TID into 4-8 oz of any non-carbonated beverage or soft food (hot or cold) (Patient not taking: Reported on 06/24/2017), Disp: 500 g, Rfl: PRN  ROS  Constitutional: Denies any fever or chills Gastrointestinal: No reported hemesis, hematochezia, vomiting, or acute GI distress Musculoskeletal: Denies any acute onset joint swelling, redness, loss of ROM, or weakness Neurological: No reported episodes of acute onset apraxia, aphasia, dysarthria, agnosia, amnesia, paralysis, loss of coordination, or loss of consciousness  Allergies  Ms. Horine is allergic to tramadol; tramadol hcl; hydrocodone-acetaminophen; and morphine and related.  PFSH  Drug: Ms. Magnussen  reports that she does not use drugs. Alcohol:  reports that she does not drink alcohol. Tobacco:  reports that she has never smoked. She has never used smokeless tobacco. Medical:  has a past medical history of Anxiety, Broken ankle, Chest pain (05/04/2014), Depression, Fibroid, Hypercholesteremia, IBS (irritable bowel syndrome), MD (muscular dystrophy) ([place]), MD (muscular dystrophy) ([place]), Muscular dystrophy ([place]), and Vitamin D insufficiency (01/07/2015). Surgical: Ms. Buffalo  has a past surgical history that  includes Tubal ligation and Thermal avu. Family: family history includes Arthritis in her mother; Diabetes in her mother and paternal grandmother; Drug abuse in her brother; Gout in her father; Hyperlipidemia in her mother; Hypertension in her father; Mental illness in her mother; Muscular dystrophy in her mother and sister; Seizures in her mother; Stroke in her mother.  Constitutional Exam  General appearance: Well nourished, well developed, and well hydrated. In no apparent acute distress Vitals:   06/24/17 1027  BP: 131/89  Pulse: 71  Resp: 16  Temp: 98.3 F (36.8 C)  TempSrc: Oral  SpO2: 95%  Weight: 195 lb (88.5 kg)  Height: _0  (1.676 m)   BMI Assessment: Estimated body mass index is 31.47 kg/m as calculated from the following:   Height as of this encounter: _1  (1.676 m).   Weight as of this encounter: 195 lb (88.5 kg).  BMI interpretation table: BMI level Category Range association with higher incidence of chronic pain  <18 kg/m2 Underweight   18.5-24.9 kg/m2 Ideal body weight   25-29.9 kg/m2 Overweight Increased incidence by 20%  30-34.9 kg/m2 Obese (Class I) Increased incidence by 68%  35-39.9 kg/m2 Severe obesity (Class II) Increased incidence by 136%  >40 kg/m2 Extreme obesity (Class III) Increased incidence by 254%   BMI Readings from Last 4 Encounters:  06/24/17 31.47 kg/m  06/14/17 31.47 kg/m  06/11/17 31.47 kg/m  03/18/17 31.00 kg/m   Wt Readings from Last 4 Encounters:  06/24/17 195 lb (88.5 kg)  06/14/17 195 lb (88.5 kg)  06/11/17 195 lb (88.5 kg)  03/18/17 195 lb (88.5 kg)  Psych/Mental status: Alert, oriented x 3 (person, place, & time)       Eyes: PERLA Respiratory: No evidence of acute respiratory distress  Cervical Spine Area Exam  Skin & Axial Inspection: No masses, redness, edema, swelling, or associated skin lesions Alignment: Symmetrical Functional ROM: Unrestricted ROM      Stability:  No instability detected Muscle Tone/Strength:  Functionally intact. No obvious neuro-muscular anomalies detected. Sensory (Neurological): Unimpaired Palpation: No palpable anomalies              Upper Extremity (UE) Exam    Side: Right upper extremity  Side: Left upper extremity  Skin & Extremity Inspection: Skin color, temperature, and hair growth are WNL. No peripheral edema or cyanosis. No masses, redness, swelling, asymmetry, or associated skin lesions. No contractures.  Skin & Extremity Inspection: Skin color, temperature, and hair growth are WNL. No peripheral edema or cyanosis. No masses, redness, swelling, asymmetry, or associated skin lesions. No contractures.  Functional ROM: Unrestricted ROM          Functional ROM: Unrestricted ROM          Muscle Tone/Strength: Functionally intact. No obvious neuro-muscular anomalies detected.  Muscle Tone/Strength: Functionally intact. No obvious neuro-muscular anomalies detected.  Sensory (Neurological): Unimpaired          Sensory (Neurological): Unimpaired          Palpation: No palpable anomalies              Palpation: No palpable anomalies              Specialized Test(s): Deferred         Specialized Test(s): Deferred          Thoracic Spine Area Exam  Skin & Axial Inspection: No masses, redness, or swelling Alignment: Symmetrical Functional ROM: Unrestricted ROM Stability: No instability detected Muscle Tone/Strength: Functionally intact. No obvious neuro-muscular anomalies detected. Sensory (Neurological): Unimpaired Muscle strength & Tone: No palpable anomalies  Lumbar Spine Area Exam  Skin & Axial Inspection: No masses, redness, or swelling Alignment: Symmetrical Functional ROM: Decreased ROM       Stability: No instability detected Muscle Tone/Strength: Functionally intact. No obvious neuro-muscular anomalies detected. Sensory (Neurological): Movement-associated pain Palpation: Complains of area being tender to palpation       Provocative Tests: Lumbar Hyperextension and  rotation test: Positive bilaterally for facet joint pain. Lumbar Lateral bending test: evaluation deferred today       Patrick's Maneuver: evaluation deferred today                    Gait & Posture Assessment  Ambulation: Unassisted Gait: Relatively normal for age and body habitus Posture: WNL   Lower Extremity Exam    Side: Right lower extremity  Side: Left lower extremity  Skin & Extremity Inspection: Skin color, temperature, and hair growth are WNL. No peripheral edema or cyanosis. No masses, redness, swelling, asymmetry, or associated skin lesions. No contractures.  Skin & Extremity Inspection: Skin color, temperature, and hair growth are WNL. No peripheral edema or cyanosis. No masses, redness, swelling, asymmetry, or associated skin lesions. No contractures.  Functional ROM: Unrestricted ROM          Functional ROM: Unrestricted ROM          Muscle Tone/Strength: Functionally intact. No obvious neuro-muscular anomalies detected.  Muscle Tone/Strength: Functionally intact. No obvious neuro-muscular anomalies detected.  Sensory (Neurological): Unimpaired  Sensory (Neurological): Unimpaired  Palpation: No palpable anomalies  Palpation: No palpable anomalies   Assessment & Plan  Primary Diagnosis & Pertinent Problem List: The primary encounter diagnosis was Spondylosis without myelopathy or radiculopathy, lumbosacral region. Diagnoses of Lumbar facet syndrome (Bilateral) (L>R), Lumbar facet hypertrophy (Bilateral) (L>R), Chronic low back pain  (Primary Source of Pain) (Bilateral) (L>R), and Chronic pain syndrome were also pertinent to this visit.  Visit Diagnosis: 1. Spondylosis without myelopathy or radiculopathy, lumbosacral region   2. Lumbar facet syndrome (Bilateral) (L>R)   3. Lumbar facet hypertrophy (Bilateral) (L>R)   4. Chronic low back pain  (Primary Source of Pain) (Bilateral) (L>R)   5. Chronic pain syndrome    Problems updated and reviewed during this visit: Problem   Chronic Pain Syndrome  Spondylosis Without Myelopathy Or Radiculopathy, Lumbosacral Region  Chronic lower extremity pain (Secondary Area of Pain) (Bilateral) ( (R>L)  Chronic upper back pain (Tertiary Area of Pain) (Bilateral) (R>L)  Esophagospasm  Chronic low back pain  (Primary Source of Pain) (Bilateral) (L>R)  Chronic lower extremity pain (Left)  Chronic lumbar radicular pain (L5 Dermatome) (Left)  Chronic (intermittent) upper extremity pain (Right)  Chest Pain  Pharmacologic Therapy  Disorder of Skeletal System  Problems Influencing Health Status  Therapeutic Opioid Induced Constipation  B12 Deficiency  Adynamia  Abnormal Uterine Bleeding  Gad (Generalized Anxiety Disorder)  H/O Urinary Tract Infection  Hypertriglyceridemia   Fenofibrate not covered by insurance. They would prefer [place] or [place].   Fibroid Uterus  Adiposity  Clinical Depression  Breathlessness On Exertion  Hld (Hyperlipidemia)  History of Depression  Mixed Hyperlipidemia    Plan of Care  Pharmacotherapy (Medications Ordered): Meds ordered this encounter  Medications  . oxyCODONE (OXY IR/ROXICODONE) 5 MG immediate release tablet    Sig: Take 1 tablet (5 mg total) by mouth every 6 (six) hours as needed for severe pain. Must last 30 days.    Dispense:  10 tablet    Refill:  0    Do not place this medication, or any other prescription from our practice, on "Automatic Refill". Patient may have prescription filled one day early if pharmacy is closed on scheduled refill date. Do not fill until: 06/24/17 To last until: 07/24/17    Procedure Orders     LUMBAR FACET(MEDIAL BRANCH NERVE Otten) MBNB Lab Orders  No laboratory test(s) ordered today   Imaging Orders  No imaging studies ordered today   Referral Orders  No referral(s) requested today    Pharmacological management options:  Opioid Analgesics: We'll take over management today. See above orders.  She will get 10 tablets of oxycodone IR  5 mg to last her for 30 days.  It is to be used only PRN. Membrane stabilizer: We have discussed the possibility of optimizing this mode of therapy, if tolerated Muscle relaxant: We have discussed the possibility of a trial NSAID: We have discussed the possibility of a trial Other analgesic(s): To be determined at a later time   Interventional management options: Planned, scheduled, and/or pending:    Diagnostic bilateral lumbar facet Goldsmith #3    Considering:   Diagnostic bilateral lumbar epidural steroid injection Diagnostic bilateral lumbar facet nerve [place] Possible bilateral lumbar facet RFA Trigger point injections   PRN Procedures:   Diagnostic left L5-S1 lumbar epidural steroid injection  Diagnostic bilateral lumbar facet Fason    Provider-requested follow-up: Return for Procedure (w/ sedation): (B) L-FCT BLK #3.  Future Appointments  Date Time Provider [place]  07/11/2017  8:00 AM Milinda Pointer, MD [facility] None  07/22/2017 12:45 PM Vevelyn Francois, NP [place] None    Primary Care Physician: Dion Body, MD Location: [facility] Note by: Gaspar Cola, MD Date: 06/24/2017; Time: 11:12 AM

## 2017-07-11 ENCOUNTER — Encounter: Payer: Self-pay | Admitting: Pain Medicine

## 2017-07-11 ENCOUNTER — Ambulatory Visit
Admission: RE | Admit: 2017-07-11 | Discharge: 2017-07-11 | Disposition: A | Discharge status: Home / Self Care | Payer: Medicare HMO | Source: Ambulatory Visit | Attending: Pain Medicine | Admitting: Pain Medicine

## 2017-07-11 ENCOUNTER — Ambulatory Visit (HOSPITAL_BASED_OUTPATIENT_CLINIC_OR_DEPARTMENT_OTHER): Payer: Medicare HMO | Admitting: Pain Medicine

## 2017-07-11 ENCOUNTER — Other Ambulatory Visit: Payer: Self-pay

## 2017-07-11 VITALS — BP 111/78 | HR 75 | Temp 98.7°F | Resp 19 | Ht 66.0 in | Wt 200.0 lb

## 2017-07-11 DIAGNOSIS — M47817 Spondylosis without myelopathy or radiculopathy, lumbosacral region: Secondary | ICD-10-CM

## 2017-07-11 DIAGNOSIS — Z888 Allergy status to other drugs, medicaments and biological substances status: Secondary | ICD-10-CM | POA: Diagnosis not present

## 2017-07-11 DIAGNOSIS — M545 Low back pain: Secondary | ICD-10-CM

## 2017-07-11 DIAGNOSIS — M47816 Spondylosis without myelopathy or radiculopathy, lumbar region: Secondary | ICD-10-CM | POA: Diagnosis not present

## 2017-07-11 DIAGNOSIS — Z885 Allergy status to narcotic agent status: Secondary | ICD-10-CM | POA: Diagnosis not present

## 2017-07-11 DIAGNOSIS — G8929 Other chronic pain: Secondary | ICD-10-CM | POA: Diagnosis not present

## 2017-07-11 MED ORDER — TRIAMCINOLONE ACETONIDE 40 MG/ML IJ SUSP
80.0000 mg | Freq: Once | INTRAMUSCULAR | Status: AC
  Administered 2017-07-11: 40 mg
  Filled 2017-07-11: qty 2

## 2017-07-11 MED ORDER — LIDOCAINE HCL 2 % IJ SOLN
20.0000 mL | Freq: Once | INTRAMUSCULAR | Status: AC
  Administered 2017-07-11: 400 mg
  Filled 2017-07-11: qty 20

## 2017-07-11 MED ORDER — FENTANYL CITRATE (PF) 100 MCG/2ML IJ SOLN
25.0000 ug | INTRAMUSCULAR | Status: DC | PRN
  Administered 2017-07-11: 50 ug via INTRAVENOUS
  Filled 2017-07-11: qty 2

## 2017-07-11 MED ORDER — MIDAZOLAM HCL 5 MG/5ML IJ SOLN
1.0000 mg | INTRAMUSCULAR | Status: DC | PRN
  Administered 2017-07-11: 2 mg via INTRAVENOUS
  Filled 2017-07-11: qty 5

## 2017-07-11 MED ORDER — LACTATED RINGERS IV SOLN
1000.0000 mL | Freq: Once | INTRAVENOUS | Status: AC
  Administered 2017-07-11: 1000 mL via INTRAVENOUS

## 2017-07-11 MED ORDER — ROPIVACAINE HCL 2 MG/ML IJ SOLN
18.0000 mL | Freq: Once | INTRAMUSCULAR | Status: AC
  Administered 2017-07-11: 10 mL via PERINEURAL
  Filled 2017-07-11: qty 20

## 2017-07-11 NOTE — Progress Notes (Signed)
Patient's Name: Sheryl Barker  MRN: [id number]  Referring Provider: Dion Body, MD  DOB: 1975-12-17  PCP: Dion Body, MD  DOS: 07/11/2017  Note by: Gaspar Cola, MD  Service setting: Ambulatory outpatient  Specialty: Interventional Pain Management  Patient type: Established  Location: [facility] (AMB) Pain Management Facility  Visit type: Interventional Procedure   Primary Reason for Visit: Interventional Pain Management Treatment. CC: Back Pain (lower)  Procedure:       Anesthesia, Analgesia, Anxiolysis:  Type: Lumbar Facet, Medial Branch Urbieta(s) #3  Primary Purpose: Diagnostic Region: Posterolateral Lumbosacral Spine Level: L2, L3, L4, L5, & S1 Medial Branch Level(s). Injecting these levels blocks the L3-4, L4-5, and L5-S1 lumbar facet joints. Laterality: Bilateral  Type: Moderate (Conscious) Sedation combined with Local Anesthesia Indication(s): Analgesia and Anxiety Route: Intravenous (IV) IV Access: Secured Sedation: Meaningful verbal contact was maintained at all times during the procedure  Local Anesthetic: Lidocaine 1-2%   Indications: 1. Spondylosis without myelopathy or radiculopathy, lumbosacral region   2. Lumbar facet syndrome (Bilateral) (L>R)   3. Lumbar facet hypertrophy (Bilateral) (L>R)   4. Chronic low back pain  (Primary Source of Pain) (Bilateral) (L>R)    Pain Score: Pre-procedure: 6 /10 Post-procedure: 0-No pain/10  Pre-op Assessment:  Sheryl Barker is a 42 y.o. (year old), female patient, seen today for interventional treatment. She  has a past surgical history that includes Tubal ligation and Thermal avu. [name] has a current medication list which includes the following prescription(s): duloxetine, oxycodone, vitamin d (ergocalciferol), bisacodyl, and neurontin, and the following Facility-Administered Medications: fentanyl, lactated ringers, and midazolam. Her primarily concern today is the Back Pain (lower)  Initial Vital Signs:  Pulse/HCG  Rate: 75ECG Heart Rate: 85 Temp: 98.7 F (37.1 C) Resp: 16 BP: 103/78 SpO2: 96 %  BMI: Estimated body mass index is 32.28 kg/m as calculated from the following:   Height as of this encounter: 5\' 6"  (1.676 m).   Weight as of this encounter: 200 lb (90.7 kg).  Risk Assessment: Allergies: Reviewed. She is allergic to tramadol; hydrocodone-acetaminophen; and morphine and related.  Allergy Precautions: None required Coagulopathies: Reviewed. None identified.  Blood-thinner therapy: None at this time Active Infection(s): Reviewed. None identified. Sheryl Barker is afebrile  Site Confirmation: Sheryl Barker was asked to confirm the procedure and laterality before marking the site Procedure checklist: Completed Consent: Before the procedure and under the influence of no sedative(s), amnesic(s), or anxiolytics, the patient was informed of the treatment options, risks and possible complications. To fulfill our ethical and legal obligations, as recommended by the American Medical Association's Code of Ethics, I have informed the patient of my clinical impression; the nature and purpose of the treatment or procedure; the risks, benefits, and possible complications of the intervention; the alternatives, including doing nothing; the risk(s) and benefit(s) of the alternative treatment(s) or procedure(s); and the risk(s) and benefit(s) of doing nothing. The patient was provided information about the general risks and possible complications associated with the procedure. These may include, but are not limited to: failure to achieve desired goals, infection, bleeding, organ or nerve damage, allergic reactions, paralysis, and death. In addition, the patient was informed of those risks and complications associated to Spine-related procedures, such as failure to decrease pain; infection (i.e.: Meningitis, epidural or intraspinal abscess); bleeding (i.e.: epidural hematoma, subarachnoid hemorrhage, or any other type of  intraspinal or peri-dural bleeding); organ or nerve damage (i.e.: Any type of peripheral nerve, nerve root, or spinal cord injury) with subsequent damage to  sensory, motor, and/or autonomic systems, resulting in permanent pain, numbness, and/or weakness of one or several areas of the body; allergic reactions; (i.e.: anaphylactic reaction); and/or death. Furthermore, the patient was informed of those risks and complications associated with the medications. These include, but are not limited to: allergic reactions (i.e.: anaphylactic or anaphylactoid reaction(s)); adrenal axis suppression; blood sugar elevation that in diabetics may result in ketoacidosis or comma; water retention that in patients with history of congestive heart failure may result in shortness of breath, pulmonary edema, and decompensation with resultant heart failure; weight gain; swelling or edema; medication-induced neural toxicity; particulate matter embolism and blood vessel occlusion with resultant organ, and/or nervous system infarction; and/or aseptic necrosis of one or more joints. Finally, the patient was informed that Medicine is not an exact science; therefore, there is also the possibility of unforeseen or unpredictable risks and/or possible complications that may result in a catastrophic outcome. The patient indicated having understood very clearly. We have given the patient no guarantees and we have made no promises. Enough time was given to the patient to ask questions, all of which were answered to the patient's satisfaction. Sheryl Barker has indicated that she wanted to continue with the procedure. Attestation: I, the ordering provider, attest that I have discussed with the patient the benefits, risks, side-effects, alternatives, likelihood of achieving goals, and potential problems during recovery for the procedure that I have provided informed consent. Date  Time: 07/11/2017  8:10 AM  Pre-Procedure Preparation:  Monitoring: As  per clinic protocol. Respiration, ETCO2, SpO2, BP, heart rate and rhythm monitor placed and checked for adequate function Safety Precautions: Patient was assessed for positional comfort and pressure points before starting the procedure. Time-out: I initiated and conducted the "Time-out" before starting the procedure, as per protocol. The patient was asked to participate by confirming the accuracy of the "Time Out" information. Verification of the correct person, site, and procedure were performed and confirmed by me, the nursing staff, and the patient. "Time-out" conducted as per Joint Commission's Universal Protocol (UP.01.01.01). Time: [phone]  Description of Procedure:       Position: Prone Laterality: Bilateral. The procedure was performed in identical fashion on both sides. Levels:  L2, L3, L4, L5, & S1 Medial Branch Level(s) Area Prepped: Posterior Lumbosacral Region Prepping solution: ChloraPrep (2% chlorhexidine gluconate and 70% isopropyl alcohol) Safety Precautions: Aspiration looking for blood return was conducted prior to all injections. At no point did we inject any substances, as a needle was being advanced. Before injecting, the patient was told to immediately notify me if she was experiencing any new onset of "ringing in the ears, or metallic taste in the mouth". No attempts were made at seeking any paresthesias. Safe injection practices and needle disposal techniques used. Medications properly checked for expiration dates. SDV (single dose vial) medications used. After the completion of the procedure, all disposable equipment used was discarded in the proper designated medical waste containers. Local Anesthesia: Protocol guidelines were followed. The patient was positioned over the fluoroscopy table. The area was prepped in the usual manner. The time-out was completed. The target area was identified using fluoroscopy. A 12-in long, straight, sterile hemostat was used with fluoroscopic  guidance to locate the targets for each level blocked. Once located, the skin was marked with an approved surgical skin marker. Once all sites were marked, the skin (epidermis, dermis, and hypodermis), as well as deeper tissues (fat, connective tissue and muscle) were infiltrated with a small amount of a short-acting local  anesthetic, loaded on a 10cc syringe with a 25G, 1.5-in  Needle. An appropriate amount of time was allowed for local anesthetics to take effect before proceeding to the next step. Local Anesthetic: Lidocaine 2.0% The unused portion of the local anesthetic was discarded in the proper designated containers. Technical explanation of process:  L2 Medial Branch Nerve Stmarie (MBB): The target area for the L2 medial branch is at the junction of the postero-lateral aspect of the superior articular process and the superior, posterior, and medial edge of the transverse process of L3. Under fluoroscopic guidance, a Quincke needle was inserted until contact was made with os over the superior postero-lateral aspect of the pedicular shadow (target area). After negative aspiration for blood, 0.5 mL of the nerve Nachtigal solution was injected without difficulty or complication. The needle was removed intact. L3 Medial Branch Nerve Beaudin (MBB): The target area for the L3 medial branch is at the junction of the postero-lateral aspect of the superior articular process and the superior, posterior, and medial edge of the transverse process of L4. Under fluoroscopic guidance, a Quincke needle was inserted until contact was made with os over the superior postero-lateral aspect of the pedicular shadow (target area). After negative aspiration for blood, 0.5 mL of the nerve [place] solution was injected without difficulty or complication. The needle was removed intact. L4 Medial Branch Nerve Strahle (MBB): The target area for the L4 medial branch is at the junction of the postero-lateral aspect of the superior articular  process and the superior, posterior, and medial edge of the transverse process of L5. Under fluoroscopic guidance, a Quincke needle was inserted until contact was made with os over the superior postero-lateral aspect of the pedicular shadow (target area). After negative aspiration for blood, 0.5 mL of the nerve Stannard solution was injected without difficulty or complication. The needle was removed intact. L5 Medial Branch Nerve Quilling (MBB): The target area for the L5 medial branch is at the junction of the postero-lateral aspect of the superior articular process and the superior, posterior, and medial edge of the sacral ala. Under fluoroscopic guidance, a Quincke needle was inserted until contact was made with os over the superior postero-lateral aspect of the pedicular shadow (target area). After negative aspiration for blood, 0.5 mL of the nerve Boettner solution was injected without difficulty or complication. The needle was removed intact. S1 Medial Branch Nerve Brander (MBB): The target area for the S1 medial branch is at the posterior and inferior 6 o'clock position of the L5-S1 facet joint. Under fluoroscopic guidance, the Quincke needle inserted for the L5 MBB was redirected until contact was made with os over the inferior and postero aspect of the sacrum, at the 6 o' clock position under the L5-S1 facet joint (Target area). After negative aspiration for blood, 0.5 mL of the nerve Vivanco solution was injected without difficulty or complication. The needle was removed intact. Procedural Needles: 22-gauge, 3.5-inch, Quincke needles used for all levels. Nerve Lurie solution: 0.2% PF-Ropivacaine + Triamcinolone (40 mg/mL) diluted to a final concentration of 4 mg of Triamcinolone/mL of Ropivacaine The unused portion of the solution was discarded in the proper designated containers.  Once the entire procedure was completed, the treated area was cleaned, making sure to leave some of the prepping solution back to  take advantage of its long term bactericidal properties.   Illustration of the posterior view of the lumbar spine and the posterior neural structures. Laminae of L2 through S1 are labeled. DPRL5, dorsal primary ramus  of L5; DPRS1, dorsal primary ramus of S1; DPR3, dorsal primary ramus of L3; FJ, facet (zygapophyseal) joint L3-L4; I, inferior articular process of L4; LB1, lateral branch of dorsal primary ramus of L1; IAB, inferior articular branches from L3 medial branch (supplies L4-L5 facet joint); IBP, intermediate branch plexus; MB3, medial branch of dorsal primary ramus of L3; NR3, third lumbar nerve root; S, superior articular process of L5; SAB, superior articular branches from L4 (supplies L4-5 facet joint also); TP3, transverse process of L3.  Vitals:   07/11/17 0855 07/11/17 0900 07/11/17 0910 07/11/17 0920  BP: 125/87 120/86 118/76 121/69  Pulse:      Resp: 12 14 18 19   Temp:      TempSrc:      SpO2: 94% 96% 94% 94%  Weight:      Height:        Start Time: 0846 hrs. End Time: 0858 hrs.  Imaging Guidance (Spinal):  Type of Imaging Technique: Fluoroscopy Guidance (Spinal) Indication(s): Assistance in needle guidance and placement for procedures requiring needle placement in or near specific anatomical locations not easily accessible without such assistance. Exposure Time: Please see nurses notes. Contrast: None used. Fluoroscopic Guidance: I was personally present during the use of fluoroscopy. "Tunnel Vision Technique" used to obtain the best possible view of the target area. Parallax error corrected before commencing the procedure. "Direction-depth-direction" technique used to introduce the needle under continuous pulsed fluoroscopy. Once target was reached, antero-posterior, oblique, and lateral fluoroscopic projection used confirm needle placement in all planes. Images permanently stored in EMR. Interpretation: No contrast injected. I personally interpreted the imaging  intraoperatively. Adequate needle placement confirmed in multiple planes. Permanent images saved into the patient's record.  Antibiotic Prophylaxis:   Anti-infectives (From admission, onward)   None     Indication(s): None identified  Post-operative Assessment:  Post-procedure Vital Signs:  Pulse/HCG Rate: 7583 Temp: 98.7 F (37.1 C) Resp: 19 BP: 121/69 SpO2: 94 %  EBL: None  Complications: No immediate post-treatment complications observed by team, or reported by patient.  Note: The patient tolerated the entire procedure well. A repeat set of vitals were taken after the procedure and the patient was kept under observation following institutional policy, for this type of procedure. Post-procedural neurological assessment was performed, showing return to baseline, prior to discharge. The patient was provided with post-procedure discharge instructions, including a section on how to identify potential problems. Should any problems arise concerning this procedure, the patient was given instructions to immediately contact us, at any time, without hesitation. In any case, we plan to contact the patient by telephone for a follow-up status report regarding this interventional procedure.  Comments:  No additional relevant information.  Plan of Care    Imaging Orders     DG C-Arm 1-60 Min-No Report  Procedure Orders     LUMBAR FACET(MEDIAL BRANCH NERVE [place]) MBNB  Medications ordered for procedure: Meds ordered this encounter  Medications  . lidocaine (XYLOCAINE) 2 % (with pres) injection 400 mg  . midazolam (VERSED) 5 MG/5ML injection 1-2 mg    Make sure Flumazenil is available in the pyxis when using this medication. If oversedation occurs, administer 0.2 mg IV over 15 sec. If after 45 sec no response, administer 0.2 mg again over 1 min; may repeat at 1 min intervals; not to exceed 4 doses (1 mg)  . fentaNYL (SUBLIMAZE) injection 25-50 mcg    Make sure Narcan is available in the  pyxis when using this medication. In the event  of respiratory depression (RR< 8/min): Titrate NARCAN (naloxone) in increments of 0.1 to 0.2 mg IV at 2-3 minute intervals, until desired degree of reversal.  . lactated ringers infusion 1,000 mL  . ropivacaine (PF) 2 mg/mL (0.2%) (NAROPIN) injection 18 mL  . triamcinolone acetonide (KENALOG-40) injection 80 mg   Medications administered: We administered lidocaine, midazolam, fentaNYL, lactated ringers, ropivacaine (PF) 2 mg/mL (0.2%), and triamcinolone acetonide.  See the medical record for exact dosing, route, and time of administration.  New Prescriptions   No medications on file   Disposition: Discharge home  Discharge Date & Time: 07/11/2017; 0930 hrs.   Physician-requested Follow-up: Return for post-procedure eval (2 wks), w/ Dr. Dossie Arbour.  Future Appointments  Date Time Provider [place]  07/24/2017  9:15 AM Milinda Pointer, MD [facility] None   Primary Care Physician: Dion Body, MD Location: [facility] Outpatient Pain Management Facility Note by: Gaspar Cola, MD Date: 07/11/2017; Time: 9:30 AM  Disclaimer:  Medicine is not an exact science. The only guarantee in medicine is that nothing is guaranteed. It is important to note that the decision to proceed with this intervention was based on the information collected from the patient. The Data and conclusions were drawn from the patient's questionnaire, the interview, and the physical examination. Because the information was provided in large part by the patient, it cannot be guaranteed that it has not been purposely or unconsciously manipulated. Every effort has been made to obtain as much relevant data as possible for this evaluation. It is important to note that the conclusions that lead to this procedure are derived in large part from the available data. Always take into account that the treatment will also be dependent on availability of resources and existing  treatment guidelines, considered by other Pain Management Practitioners as being common knowledge and practice, at the time of the intervention. For Medico-Legal purposes, it is also important to point out that variation in procedural techniques and pharmacological choices are the acceptable norm. The indications, contraindications, technique, and results of the above procedure should only be interpreted and judged by a Board-Certified Interventional Pain Specialist with extensive familiarity and expertise in the same exact procedure and technique.

## 2017-07-11 NOTE — Progress Notes (Signed)
Safety precautions to be maintained throughout the outpatient stay will include: orient to surroundings, keep bed in low position, maintain call bell within reach at all times, provide assistance with transfer out of bed and ambulation.  

## 2017-07-11 NOTE — Patient Instructions (Signed)

## 2017-07-12 ENCOUNTER — Telehealth: Payer: Self-pay

## 2017-07-12 NOTE — Telephone Encounter (Signed)
Post procedure phone call. Patient states she is doing good.  

## 2017-07-22 ENCOUNTER — Encounter: Payer: Medicare HMO | Admitting: Nurse Practitioner

## 2017-07-23 NOTE — Progress Notes (Signed)
Patient's Name: Sheryl Barker  MRN: [id number]  Referring Provider: Dion Body, MD  DOB: 1975-09-30  PCP: Dion Body, MD  DOS: 07/24/2017  Note by: Gaspar Cola, MD  Service setting: Ambulatory outpatient  Specialty: Interventional Pain Management  Location: [facility] (AMB) Pain Management Facility    Patient type: Established   Primary Reason(s) for Visit: Encounter for post-procedure evaluation of chronic illness with mild to moderate exacerbation CC: Leg Pain (left)  HPI  Sheryl Barker is a 42 y.o. year old, female patient, who comes today for a post-procedure evaluation. 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[place]); Adynamia; Chest pain; Long term current use of opiate analgesic; Long term prescription opiate use; Opiate use (30 MME/Day); Opiate dependence ([place]); Encounter for therapeutic drug level monitoring; Chronic low back pain  (Primary Source of Pain) (Bilateral) (L>R); Lumbar spondylosis (L>R); Chronic lower extremity pain (Left); Chronic lumbar radicular pain (L5 Dermatome) (Left); Myotonic dystrophy, 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); Diffuse myofascial pain syndrome; Musculoskeletal pain of upper extremity (Bilateral) (Proximal/Biceps muscles); Chronic (intermittent) upper extremity pain (Right); Lumbar facet hypertrophy (Bilateral) (L>R); Grade 1 Retrolisthesis of L5 over S1; Vitamin D deficiency; Myotonic dystrophy ([place]); Neurogenic pain; Therapeutic opioid induced constipation; Esophagospasm; Abnormal uterine bleeding; Opioid-induced constipation (OIC); Elevated liver enzymes; Elevated sedimentation rate; Elevated C-reactive protein (CRP); Lumbar facet syndrome (Bilateral) (L>R); History of depression; Mixed hyperlipidemia; Chronic lower extremity pain (Secondary  Area of Pain) (Bilateral) ( (R>L); Chronic upper back pain [facility] Area of Pain) (Bilateral) (R>L); Pharmacologic therapy; Disorder of skeletal system; Problems influencing health status; Chronic pain syndrome; and Spondylosis without myelopathy or radiculopathy, lumbosacral region on their problem list. Her primarily concern today is the Leg Pain (left)  Pain Assessment: Location: Left Leg Radiating: Denies Onset: More than a month ago Duration: Chronic pain Quality: Aching Severity: 2 /10 (subjective, self-reported pain score)  Note: Reported level is compatible with observation.                         When using our objective Pain Scale, levels between 6 and 10/10 are said to belong in an emergency room, as it progressively worsens from a 6/10, described as severely limiting, requiring emergency care not usually available at an outpatient pain management facility. At a 6/10 level, communication becomes difficult and requires great effort. Assistance to reach the emergency department may be required. Facial flushing and profuse sweating along with potentially dangerous increases in heart rate and blood pressure will be evident. Timing: Intermittent Modifying factors: medications, hot shower BP: 111/73  HR: 63  Sheryl Barker comes in today for post-procedure evaluation after the treatment done on 07/12/2017.  Further details on both, my assessment(s), as well as the proposed treatment plan, please see below.  Post-Procedure Assessment  07/11/2017 Procedure: Diagnostic bilateral lumbar facet Bowen #3  under fluoroscopic guidance and IV sedation Pre-procedure pain score:  6/10 Post-procedure pain score: 0/10 (100% relief) Influential Factors: BMI: 32.28 kg/m Intra-procedural challenges: None observed.         Assessment challenges: None detected.              Reported side-effects: None.        Post-procedural adverse reactions or complications: None reported         Sedation: Sedation  provided. When no sedatives are used, the analgesic levels obtained are directly associated  to the effectiveness of the local anesthetics. However, when sedation is provided, the level of analgesia obtained during the initial 1 hour following the intervention, is believed to be the result of a combination of factors. These factors may include, but are not limited to: 1. The effectiveness of the local anesthetics used. 2. The effects of the analgesic(s) and/or anxiolytic(s) used. 3. The degree of discomfort experienced by the patient at the time of the procedure. 4. The patients ability and reliability in recalling and recording the events. 5. The presence and influence of possible secondary gains and/or psychosocial factors. Reported result: Relief experienced during the 1st hour after the procedure: 100 % (Ultra-Short Term Relief)            Interpretative annotation: Clinically appropriate result. Analgesia during this period is likely to be Local Anesthetic and/or IV Sedative (Analgesic/Anxiolytic) related.          Effects of local anesthetic: The analgesic effects attained during this period are directly associated to the localized infiltration of local anesthetics and therefore cary significant diagnostic value as to the etiological location, or anatomical origin, of the pain. Expected duration of relief is directly dependent on the pharmacodynamics of the local anesthetic used. Long-acting (4-6 hours) anesthetics used.  Reported result: Relief during the next 4 to 6 hour after the procedure: 100 % (Short-Term Relief)            Interpretative annotation: Clinically appropriate result. Analgesia during this period is likely to be Local Anesthetic-related.          Long-term benefit: Defined as the period of time past the expected duration of local anesthetics (1 hour for short-acting and 4-6 hours for long-acting). With the possible exception of prolonged sympathetic blockade from the local  anesthetics, benefits during this period are typically attributed to, or associated with, other factors such as analgesic sensory neuropraxia, antiinflammatory effects, or beneficial biochemical changes provided by agents other than the local anesthetics.  Reported result: Extended relief following procedure: 100 % (Long-Term Relief)            Interpretative annotation: Clinically appropriate result. Good relief. No permanent benefit expected. Inflammation plays a part in the etiology to the pain.          Current benefits: Defined as reported results that persistent at this point in time.   Analgesia: 75-100 % Sheryl Barker reports improvement of axial symptoms. Function: Sheryl Barker reports improvement in function ROM: Sheryl Barker reports improvement in ROM Interpretative annotation: Ongoing benefit. Therapeutic benefit observed. Effective therapeutic approach.          Interpretation: Results would suggest a successful diagnostic intervention.                  Plan:  Set up procedure as a PRN palliative treatment option for this patient.                Laboratory Chemistry  Inflammation Markers (CRP: Acute Phase) (ESR: Chronic Phase) Lab Results  Component Value Date   CRP 1.4 06/11/2017   ESRSEDRATE 12 06/11/2017                         Renal Function Markers Lab Results  Component Value Date   BUN 14 09/21/2015   CREATININE 0.65 09/21/2015   GFRAA >60 09/21/2015   GFRNONAA >60 09/21/2015  Hepatic Function Markers Lab Results  Component Value Date   AST 68 (H) 09/21/2015   ALT 70 (H) 09/21/2015   ALBUMIN 3.6 09/21/2015   ALKPHOS 132 (H) 09/21/2015   LIPASE 29 12/20/2013                        Electrolytes Lab Results  Component Value Date   NA 140 09/21/2015   K 3.5 09/21/2015   CL 105 09/21/2015   CALCIUM 9.9 09/21/2015   MG 2.1 06/11/2017                        Neuropathy Markers Lab Results  Component Value Date   VITAMINB12 298  06/11/2017                        Bone Pathology Markers Lab Results  Component Value Date   25OHVITD1 14 (L) 09/21/2015   25OHVITD2 9.7 09/21/2015   25OHVITD3 4.3 09/21/2015                         Coagulation Parameters Lab Results  Component Value Date   PLT 214 06/19/2014                        Cardiovascular Markers Lab Results  Component Value Date   BNP 59 09/17/2013   TROPONINI <0.03 06/19/2014   HGB 14.1 06/19/2014   HCT 42.6 06/19/2014                         Note: Lab results reviewed.  Recent Diagnostic Imaging Results  DG C-Arm 1-60 Min-No Report Fluoroscopy was utilized by the requesting physician.  No radiographic  interpretation.   Complexity Note: I personally reviewed the fluoroscopic imaging of the procedure.                        Meds   Current Outpatient Medications:  .  DULoxetine (CYMBALTA) 20 MG capsule, Take 60 mg by mouth daily. , Disp: , Rfl: 0 .  oxyCODONE (OXY IR/ROXICODONE) 5 MG immediate release tablet, Take 1 tablet (5 mg total) by mouth every 6 (six) hours as needed for severe pain. Must last 30 days., Disp: 10 tablet, Rfl: 0 .  Vitamin D, Ergocalciferol, (DRISDOL) 50000 units CAPS capsule, Take 1 capsule by mouth every morning., Disp: , Rfl: 3 .  bisacodyl (DULCOLAX) 5 MG EC tablet, Take 2 tablets (10 mg total) by mouth at bedtime as needed for moderate constipation ((Hold for loose stool))., Disp: 100 tablet, Rfl: 0 .  NEURONTIN 300 MG capsule, Take 1 capsule (300 mg total) by mouth 3 (three) times daily. In addition take 1-3 capsules at bedtime., Disp: 180 capsule, Rfl: 2  ROS  Constitutional: Denies any fever or chills Gastrointestinal: No reported hemesis, hematochezia, vomiting, or acute GI distress Musculoskeletal: Denies any acute onset joint swelling, redness, loss of ROM, or weakness Neurological: No reported episodes of acute onset apraxia, aphasia, dysarthria, agnosia, amnesia, paralysis, loss of coordination, or loss of  consciousness  Allergies  Sheryl Barker is allergic to tramadol; hydrocodone-acetaminophen; and morphine and related.  PFSH  Drug: Sheryl Barker  reports that she does not use drugs. Alcohol:  reports that she does not drink alcohol. Tobacco:  reports that she has never smoked. She has never used smokeless tobacco.  Medical:  has a past medical history of Anxiety, Broken ankle, Chest pain (05/04/2014), Depression, Fibroid, Hypercholesteremia, IBS (irritable bowel syndrome), MD (muscular dystrophy) ([place]), MD (muscular dystrophy) ([place]), Muscular dystrophy ([place]), and Vitamin D insufficiency (01/07/2015). Surgical: Sheryl Barker  has a past surgical history that includes Tubal ligation and Thermal avu. Family: family history includes Arthritis in her mother; Diabetes in her mother and paternal grandmother; Drug abuse in her brother; Gout in her father; Hyperlipidemia in her mother; Hypertension in her father; Mental illness in her mother; Muscular dystrophy in her mother and sister; Seizures in her mother; Stroke in her mother.  Constitutional Exam  General appearance: Well nourished, well developed, and well hydrated. In no apparent acute distress Vitals:   07/24/17 0901  BP: 111/73  Pulse: 63  Temp: 98 F (36.7 C)  SpO2: 96%  Weight: 200 lb (90.7 kg)  Height: 5' 6"  (1.676 m)   BMI Assessment: Estimated body mass index is 32.28 kg/m as calculated from the following:   Height as of this encounter: 5' 6"  (1.676 m).   Weight as of this encounter: 200 lb (90.7 kg).  BMI interpretation table: BMI level Category Range association with higher incidence of chronic pain  <18 kg/m2 Underweight   18.5-24.9 kg/m2 Ideal body weight   25-29.9 kg/m2 Overweight Increased incidence by 20%  30-34.9 kg/m2 Obese (Class I) Increased incidence by 68%  35-39.9 kg/m2 Severe obesity (Class II) Increased incidence by 136%  >40 kg/m2 Extreme obesity (Class III) Increased incidence by 254%   Patient's current BMI Ideal Body  weight  Body mass index is 32.28 kg/m. Ideal body weight: 59.3 kg (130 lb 11.7 oz) Adjusted ideal body weight: 71.9 kg (158 lb 7 oz)   BMI Readings from Last 4 Encounters:  07/24/17 32.28 kg/m  07/11/17 32.28 kg/m  06/24/17 31.47 kg/m  06/14/17 31.47 kg/m   Wt Readings from Last 4 Encounters:  07/24/17 200 lb (90.7 kg)  07/11/17 200 lb (90.7 kg)  06/24/17 195 lb (88.5 kg)  06/14/17 195 lb (88.5 kg)  Psych/Mental status: Alert, oriented x 3 (person, place, & time)       Eyes: PERLA Respiratory: No evidence of acute respiratory distress  Cervical Spine Area Exam  Skin & Axial Inspection: No masses, redness, edema, swelling, or associated skin lesions Alignment: Symmetrical Functional ROM: Unrestricted ROM      Stability: No instability detected Muscle Tone/Strength: Functionally intact. No obvious neuro-muscular anomalies detected. Sensory (Neurological): Unimpaired Palpation: No palpable anomalies              Upper Extremity (UE) Exam    Side: Right upper extremity  Side: Left upper extremity  Skin & Extremity Inspection: Skin color, temperature, and hair growth are WNL. No peripheral edema or cyanosis. No masses, redness, swelling, asymmetry, or associated skin lesions. No contractures.  Skin & Extremity Inspection: Skin color, temperature, and hair growth are WNL. No peripheral edema or cyanosis. No masses, redness, swelling, asymmetry, or associated skin lesions. No contractures.  Functional ROM: Unrestricted ROM          Functional ROM: Unrestricted ROM          Muscle Tone/Strength: Functionally intact. No obvious neuro-muscular anomalies detected.  Muscle Tone/Strength: Functionally intact. No obvious neuro-muscular anomalies detected.  Sensory (Neurological): Unimpaired          Sensory (Neurological): Unimpaired          Palpation: No palpable anomalies  Palpation: No palpable anomalies              Provocative Test(s):  Phalen's test: deferred Tinel's  test: deferred Apley's scratch test (touch opposite shoulder):  Action 1 (Across chest): deferred Action 2 (Overhead): deferred Action 3 (LB reach): deferred   Provocative Test(s):  Phalen's test: deferred Tinel's test: deferred Apley's scratch test (touch opposite shoulder):  Action 1 (Across chest): deferred Action 2 (Overhead): deferred Action 3 (LB reach): deferred    Thoracic Spine Area Exam  Skin & Axial Inspection: No masses, redness, or swelling Alignment: Symmetrical Functional ROM: Unrestricted ROM Stability: No instability detected Muscle Tone/Strength: Functionally intact. No obvious neuro-muscular anomalies detected. Sensory (Neurological): Unimpaired Muscle strength & Tone: No palpable anomalies  Lumbar Spine Area Exam  Skin & Axial Inspection: No masses, redness, or swelling Alignment: Symmetrical Functional ROM: Unrestricted ROM       Stability: No instability detected Muscle Tone/Strength: Functionally intact. No obvious neuro-muscular anomalies detected. Sensory (Neurological): Unimpaired Palpation: No palpable anomalies       Provocative Tests: Lumbar Hyperextension/rotation test: deferred today       Lumbar quadrant test (Kemp's test): deferred today       Lumbar Lateral bending test: deferred today       Patrick's Maneuver: deferred today                   FABER test: deferred today       Thigh-thrust test: deferred today       S-I compression test: deferred today       S-I distraction test: deferred today        Gait & Posture Assessment  Ambulation: Unassisted Gait: Relatively normal for age and body habitus Posture: WNL   Lower Extremity Exam    Side: Right lower extremity  Side: Left lower extremity  Stability: No instability observed          Stability: No instability observed          Skin & Extremity Inspection: Skin color, temperature, and hair growth are WNL. No peripheral edema or cyanosis. No masses, redness, swelling, asymmetry, or  associated skin lesions. No contractures.  Skin & Extremity Inspection: Skin color, temperature, and hair growth are WNL. No peripheral edema or cyanosis. No masses, redness, swelling, asymmetry, or associated skin lesions. No contractures.  Functional ROM: Unrestricted ROM                  Functional ROM: Unrestricted ROM                  Muscle Tone/Strength: Functionally intact. No obvious neuro-muscular anomalies detected.  Muscle Tone/Strength: Functionally intact. No obvious neuro-muscular anomalies detected.  Sensory (Neurological): Unimpaired  Sensory (Neurological): Unimpaired  Palpation: No palpable anomalies  Palpation: No palpable anomalies   Assessment  Primary Diagnosis & Pertinent Problem List: The primary encounter diagnosis was Chronic low back pain  (Primary Source of Pain) (Bilateral) (L>R). Diagnoses of Chronic lower extremity pain (Secondary Area of Pain) (Bilateral) ( (R>L), Chronic upper back pain (Tertiary Area of Pain) (Bilateral) (R>L), and Chronic pain syndrome were also pertinent to this visit.  Status Diagnosis  Improved Improved Improved 1. Chronic low back pain  (Primary Source of Pain) (Bilateral) (L>R)   2. Chronic lower extremity pain (Secondary Area of Pain) (Bilateral) ( (R>L)   3. Chronic upper back pain [facility] Area of Pain) (Bilateral) (R>L)   4. Chronic pain syndrome     Problems updated and reviewed  during this visit: No problems updated. Plan of Care  Pharmacotherapy (Medications Ordered): Meds ordered this encounter  Medications  . oxyCODONE (OXY IR/ROXICODONE) 5 MG immediate release tablet    Sig: Take 1 tablet (5 mg total) by mouth every 6 (six) hours as needed for severe pain. Must last 30 days.    Dispense:  10 tablet    Refill:  0    Do not place this medication, or any other prescription from our practice, on "Automatic Refill". Patient may have prescription filled one day early if pharmacy is closed on scheduled refill date. Do not  fill until: 07/24/17 To last until: 08/23/17   Medications administered today: Sheryl Barker had no medications administered during this visit.  Procedure Orders    No procedure(s) ordered today   Lab Orders  No laboratory test(s) ordered today   Imaging Orders  No imaging studies ordered today   Referral Orders  No referral(s) requested today   Interventional management options: Planned, scheduled, and/or pending:   None at this time   Considering:   Diagnostic bilateral lumbar epidural steroid injection Diagnostic bilateral lumbar facet nerve Lamontagne #4 Possible bilateral lumbar facet RFA Trigger point injections   Palliative PRN treatment(s):   Possible bilateral lumbar facet RFA (PRN)   Provider-requested follow-up: Return if symptoms worsen or fail to improve, for Med-Mgmt, w/ Dionisio David, NP.  No future appointments. Primary Care Physician: Dion Body, MD Location: [facility] Outpatient Pain Management Facility Note by: Gaspar Cola, MD Date: 07/24/2017; Time: 5:33 PM

## 2017-07-24 ENCOUNTER — Ambulatory Visit: Payer: Medicare HMO | Attending: Pain Medicine | Admitting: Pain Medicine

## 2017-07-24 ENCOUNTER — Other Ambulatory Visit: Payer: Self-pay

## 2017-07-24 ENCOUNTER — Encounter: Payer: Self-pay | Admitting: Pain Medicine

## 2017-07-24 VITALS — BP 111/73 | HR 63 | Temp 98.0°F | Ht 66.0 in | Wt 200.0 lb

## 2017-07-24 DIAGNOSIS — M545 Low back pain: Secondary | ICD-10-CM | POA: Diagnosis not present

## 2017-07-24 DIAGNOSIS — R079 Chest pain, unspecified: Secondary | ICD-10-CM | POA: Diagnosis not present

## 2017-07-24 DIAGNOSIS — G7100 Muscular dystrophy, unspecified: Secondary | ICD-10-CM | POA: Insufficient documentation

## 2017-07-24 DIAGNOSIS — G894 Chronic pain syndrome: Secondary | ICD-10-CM

## 2017-07-24 DIAGNOSIS — M79604 Pain in right leg: Secondary | ICD-10-CM | POA: Diagnosis not present

## 2017-07-24 DIAGNOSIS — G8929 Other chronic pain: Secondary | ICD-10-CM

## 2017-07-24 DIAGNOSIS — E559 Vitamin D deficiency, unspecified: Secondary | ICD-10-CM | POA: Insufficient documentation

## 2017-07-24 DIAGNOSIS — R7982 Elevated C-reactive protein (CRP): Secondary | ICD-10-CM | POA: Insufficient documentation

## 2017-07-24 DIAGNOSIS — M79605 Pain in left leg: Secondary | ICD-10-CM | POA: Insufficient documentation

## 2017-07-24 DIAGNOSIS — E782 Mixed hyperlipidemia: Secondary | ICD-10-CM | POA: Insufficient documentation

## 2017-07-24 DIAGNOSIS — E785 Hyperlipidemia, unspecified: Secondary | ICD-10-CM | POA: Insufficient documentation

## 2017-07-24 DIAGNOSIS — K589 Irritable bowel syndrome without diarrhea: Secondary | ICD-10-CM | POA: Diagnosis not present

## 2017-07-24 DIAGNOSIS — F329 Major depressive disorder, single episode, unspecified: Secondary | ICD-10-CM | POA: Insufficient documentation

## 2017-07-24 DIAGNOSIS — M549 Dorsalgia, unspecified: Secondary | ICD-10-CM

## 2017-07-24 DIAGNOSIS — E538 Deficiency of other specified B group vitamins: Secondary | ICD-10-CM | POA: Diagnosis not present

## 2017-07-24 DIAGNOSIS — M47817 Spondylosis without myelopathy or radiculopathy, lumbosacral region: Secondary | ICD-10-CM | POA: Diagnosis not present

## 2017-07-24 DIAGNOSIS — N939 Abnormal uterine and vaginal bleeding, unspecified: Secondary | ICD-10-CM | POA: Diagnosis not present

## 2017-07-24 DIAGNOSIS — G723 Periodic paralysis: Secondary | ICD-10-CM | POA: Diagnosis not present

## 2017-07-24 DIAGNOSIS — G7111 Myotonic muscular dystrophy: Secondary | ICD-10-CM | POA: Diagnosis not present

## 2017-07-24 DIAGNOSIS — M7918 Myalgia, other site: Secondary | ICD-10-CM | POA: Diagnosis not present

## 2017-07-24 DIAGNOSIS — K224 Dyskinesia of esophagus: Secondary | ICD-10-CM | POA: Diagnosis not present

## 2017-07-24 DIAGNOSIS — M79601 Pain in right arm: Secondary | ICD-10-CM | POA: Diagnosis not present

## 2017-07-24 DIAGNOSIS — M4726 Other spondylosis with radiculopathy, lumbar region: Secondary | ICD-10-CM | POA: Diagnosis not present

## 2017-07-24 DIAGNOSIS — E669 Obesity, unspecified: Secondary | ICD-10-CM | POA: Insufficient documentation

## 2017-07-24 DIAGNOSIS — R748 Abnormal levels of other serum enzymes: Secondary | ICD-10-CM | POA: Insufficient documentation

## 2017-07-24 DIAGNOSIS — M546 Pain in thoracic spine: Secondary | ICD-10-CM | POA: Insufficient documentation

## 2017-07-24 DIAGNOSIS — Z79891 Long term (current) use of opiate analgesic: Secondary | ICD-10-CM | POA: Diagnosis not present

## 2017-07-24 DIAGNOSIS — F411 Generalized anxiety disorder: Secondary | ICD-10-CM | POA: Insufficient documentation

## 2017-07-24 MED ORDER — OXYCODONE HCL 5 MG PO TABS: 5.0000 mg | ORAL_TABLET | Freq: Four times a day (QID) | ORAL | 0 refills | Status: AC | PRN

## 2017-08-05 ENCOUNTER — Ambulatory Visit: Payer: Medicare HMO | Admitting: Pain Medicine

## 2017-08-06 DIAGNOSIS — H2513 Age-related nuclear cataract, bilateral: Secondary | ICD-10-CM | POA: Diagnosis not present

## 2017-08-13 ENCOUNTER — Ambulatory Visit: Payer: Medicare HMO | Admitting: Pain Medicine

## 2017-08-15 ENCOUNTER — Other Ambulatory Visit: Payer: Self-pay | Admitting: *Deleted

## 2017-08-15 ENCOUNTER — Inpatient Hospital Stay
Admission: RE | Admit: 2017-08-15 | Discharge: 2017-08-15 | Disposition: A | Discharge status: Home / Self Care | Payer: Self-pay | Source: Ambulatory Visit | Attending: *Deleted | Admitting: *Deleted

## 2017-08-15 ENCOUNTER — Other Ambulatory Visit: Payer: Self-pay | Admitting: Family Medicine

## 2017-08-15 DIAGNOSIS — N6459 Other signs and symptoms in breast: Secondary | ICD-10-CM

## 2017-08-15 DIAGNOSIS — Z9289 Personal history of other medical treatment: Secondary | ICD-10-CM

## 2017-08-16 DIAGNOSIS — N930 Postcoital and contact bleeding: Secondary | ICD-10-CM | POA: Diagnosis not present

## 2017-08-16 DIAGNOSIS — Z124 Encounter for screening for malignant neoplasm of cervix: Secondary | ICD-10-CM | POA: Diagnosis not present

## 2017-08-19 DIAGNOSIS — N930 Postcoital and contact bleeding: Secondary | ICD-10-CM | POA: Diagnosis not present

## 2017-08-19 DIAGNOSIS — N83201 Unspecified ovarian cyst, right side: Secondary | ICD-10-CM | POA: Diagnosis not present

## 2017-08-19 DIAGNOSIS — N83202 Unspecified ovarian cyst, left side: Secondary | ICD-10-CM | POA: Diagnosis not present

## 2017-09-03 ENCOUNTER — Ambulatory Visit
Admission: RE | Admit: 2017-09-03 | Discharge: 2017-09-03 | Disposition: A | Discharge status: Home / Self Care | Payer: Medicare HMO | Source: Ambulatory Visit | Attending: Family Medicine | Admitting: Family Medicine

## 2017-09-03 DIAGNOSIS — N6459 Other signs and symptoms in breast: Secondary | ICD-10-CM

## 2017-09-03 DIAGNOSIS — R928 Other abnormal and inconclusive findings on diagnostic imaging of breast: Secondary | ICD-10-CM | POA: Diagnosis not present

## 2017-09-18 DIAGNOSIS — N83201 Unspecified ovarian cyst, right side: Secondary | ICD-10-CM | POA: Diagnosis not present

## 2017-09-18 DIAGNOSIS — R102 Pelvic and perineal pain: Secondary | ICD-10-CM | POA: Diagnosis not present

## 2017-09-18 DIAGNOSIS — N83202 Unspecified ovarian cyst, left side: Secondary | ICD-10-CM | POA: Diagnosis not present

## 2017-09-18 DIAGNOSIS — N930 Postcoital and contact bleeding: Secondary | ICD-10-CM | POA: Diagnosis not present

## 2017-10-02 NOTE — H&P (Signed)
Sheryl BarkerBlockis a 42 y.o.femalehere forLAVH and right salpingoophorectomy  And left salpingectomy  pt is here for f/up for right ovarian cystpelvic painand post coital bleeding . .she has myotonic dystrophy   U/s today : Uterus anteverted  Fibroids seen:1)near cervix=1.6cm 2)anterior=2.4cm 3)fundal posterior=3.2cm 4)posterior=1.8cm  Endometrium=7.28mm  No free fluid seen  Lt ovary simple cyst=1.5cm  Rt ovary complex cyst with septation=8.4cm; septation=0.33cm  Rt ovary volume=289.30ml  Rt ovary cyst volume=213.33ml  Doppler waveforms performed on Rt ovary    Past Medical History:has a past medical history of Anxiety, Chronic low back pain, Hyperlipidemia, and Muscular dystrophy, myotonic (CMS-HCC). Past Surgical History:has a past surgical history that includes tubal ligation; Thermal Ablation Endometrium (10/2016); and Tubal ligation. Family History:family history includes Gout in her father; High blood pressure (Hypertension) in her father; Myopathy in her mother. Social History:reports that she has never smoked. She has never used smokeless tobacco. She reports that she does not drink alcohol or use drugs. OB/GYN History:         OB History   Gravida  2   Para  2   Term     Preterm     AB     Living  2     SAB     TAB     Ectopic     Molar     Multiple     Live Births  2         Allergies:is allergic to hydrocodone; morphine; and tramadol., demerol Medications:  Current Outpatient Medications:  . calcium carbonate-vitamin D3 (CALTRATE 600+D) 600 mg(1,500mg ) -400 unit tablet, Take 1 tablet by mouth 2 (two) times daily with meals, Disp: , Rfl:  . cyclobenzaprine (FLEXERIL) 5 MG tablet, Take 5 mg by mouth 3 (three) times daily as needed for Muscle spasms, Disp: , Rfl:  . DULoxetine (CYMBALTA) 60 MG DR capsule, Take 1 capsule (60 mg total) by mouth once daily, Disp: 90 capsule, Rfl:  1  Review of Systems: General: No fatigue or weightloss Eyes:No vision changes Ears:No hearing difficulty Respiratory:No cough or shortness of breath Pulmonary: No asthma or shortness of breath Cardiovascular:No chest pain, palpitations, dyspnea on exertion Gastrointestinal:No abdominal bloating, chronic diarrhea, constipations, masses, pain or hematochezia Genitourinary:No hematuria, dysuria, abnormal vaginal discharge,+pelvic pain,+ postcoital bleedingMenometrorrhagia Lymphatic:No swollen lymph nodes Musculoskeletal:No muscle weakness Neurologic:No extremity weakness, syncope, seizure disorder Psychiatric:No history of depression, delusions or suicidal/homicidal ideation   Exam:      Vitals:   10/02/2017  BP: 109/86  Pulse: 73    Body mass index is 33.07 kg/m.  WDWN white/ female in NAD  Lungs: CTA  CV: RRR without murmur   Neck: no thyromegaly Abdomen: soft , no mass, normal active bowel sounds, non-tender, no rebound tenderness Pelvic: tanner stage 5 ,  External genitalia: vulva /labia no lesions Urethra: no prolapse Vagina: normal physiologic d/c, adequate room for LAVH Cervix: no lesions, no cervical motion tenderness  Uterus: normal size shape and contour, non-tender Adnexa:no mass, non-tender    Impression:   The primary encounter diagnosis was Ovarian cyst, right. Diagnoses of Postcoital bleeding and Pelvic pain in female were also pertinent to this visit.    Plan:  I have spoken with the patient regarding treatment options including expectant management, or surgical intervention.ie LAVH and RSO and left salpingectomyAfter a full discussion the pt elects to proceed with surgery   I have explained the procedure to the  pt .    Caroline Sauger, MD  Electronically signed by Boykin Nearing, MD at 10/02/2017 3:30 PM

## 2017-10-02 NOTE — H&P (Addendum)
Sheryl Barker is a 42 y.o. female here forLAVH and right salpingoophorectomy  And left salpingectomy  pt is here for f/up for right ovarian cyst pelvic pain  and post coital bleeding . .she has myotonic dystrophy   U/s today : Uterus anteverted  Fibroids seen:1)near cervix=1.6cm 2)anterior=2.4cm 3)fundal posterior=3.2cm 4)posterior=1.8cm  Endometrium=7.56mm  No free fluid seen  Lt ovary simple cyst=1.5cm  Rt ovary complex cyst with septation=8.4cm; septation=0.33cm  Rt ovary volume=289.38ml  Rt ovary cyst volume=213.37ml  Doppler waveforms performed on Rt ovary    Past Medical History:  has a past medical history of Anxiety, Chronic low back pain, Hyperlipidemia, and Muscular dystrophy, myotonic (CMS-HCC).  Past Surgical History:  has a past surgical history that includes tubal ligation; Thermal Ablation Endometrium (10/2016); and Tubal ligation. Family History: family history includes Gout in her father; High blood pressure (Hypertension) in her father; Myopathy in her mother. Social History:  reports that she has never smoked. She has never used smokeless tobacco. She reports that she does not drink alcohol or use drugs. OB/GYN History:          OB History    Gravida  2   Para  2   Term      Preterm      AB      Living  2     SAB      TAB      Ectopic      Molar      Multiple      Live Births  2          Allergies: is allergic to hydrocodone; morphine; and tramadol., demerol Medications:  Current Outpatient Medications:  .  calcium carbonate-vitamin D3 (CALTRATE 600+D) 600 mg(1,500mg ) -400 unit tablet, Take 1 tablet by mouth 2 (two) times daily with meals, Disp: , Rfl:  .  cyclobenzaprine (FLEXERIL) 5 MG tablet, Take 5 mg by mouth 3 (three) times daily as needed for Muscle spasms, Disp: , Rfl:  .  DULoxetine (CYMBALTA) 60 MG DR capsule, Take 1 capsule (60 mg total) by mouth once daily, Disp: 90 capsule, Rfl: 1  Review  of Systems: General:                      No fatigue or weight loss Eyes:                           No vision changes Ears:                            No hearing difficulty Respiratory:                No cough or shortness of breath Pulmonary:                  No asthma or shortness of breath Cardiovascular:           No chest pain, palpitations, dyspnea on exertion Gastrointestinal:          No abdominal bloating, chronic diarrhea, constipations, masses, pain or hematochezia Genitourinary:             No hematuria, dysuria, abnormal vaginal discharge, +pelvic pain, + postcoital bleeding Menometrorrhagia Lymphatic:                   No swollen lymph nodes Musculoskeletal:         No muscle  weakness Neurologic:                  No extremity weakness, syncope, seizure disorder Psychiatric:                  No history of depression, delusions or suicidal/homicidal ideation    Exam:      Vitals:   10/03/2017 1050  BP: 109/86  Pulse: 73    Body mass index is 33.07 kg/m.  WDWN white/ female in NAD   Lungs: CTA  CV : RRR without murmur    Neck:  no thyromegaly Abdomen: soft , no mass, normal active bowel sounds,  non-tender, no rebound tenderness Pelvic: tanner stage 5 ,  External genitalia: vulva /labia no lesions Urethra: no prolapse Vagina: normal physiologic d/c, adequate room for LAVH  Cervix: no lesions, no cervical motion tenderness   Uterus: normal size shape and contour, non-tender Adnexa: no mass,  non-tender     Impression:   The primary encounter diagnosis was Ovarian cyst, right. Diagnoses of Postcoital bleeding and Pelvic pain in female were also pertinent to this visit.    Plan:  I have spoken with the patient regarding treatment options including expectant management, or surgical intervention.ie LAVH and RSO and left salpingectomy  After a full discussion the pt elects to proceed with surgery   I have explained the procedure to the pt  .    Caroline Sauger, MD

## 2017-10-14 ENCOUNTER — Other Ambulatory Visit: Payer: Self-pay

## 2017-10-14 ENCOUNTER — Encounter
Admission: RE | Admit: 2017-10-14 | Discharge: 2017-10-14 | Disposition: A | Discharge status: Home / Self Care | Payer: Medicare HMO | Source: Ambulatory Visit | Attending: Obstetrics and Gynecology | Admitting: Obstetrics and Gynecology

## 2017-10-14 DIAGNOSIS — Z01812 Encounter for preprocedural laboratory examination: Secondary | ICD-10-CM | POA: Insufficient documentation

## 2017-10-14 DIAGNOSIS — G7100 Muscular dystrophy, unspecified: Secondary | ICD-10-CM | POA: Diagnosis not present

## 2017-10-14 DIAGNOSIS — R9431 Abnormal electrocardiogram [ECG] [EKG]: Secondary | ICD-10-CM | POA: Insufficient documentation

## 2017-10-14 DIAGNOSIS — Z01810 Encounter for preprocedural cardiovascular examination: Secondary | ICD-10-CM | POA: Diagnosis not present

## 2017-10-14 LAB — BASIC METABOLIC PANEL
ANION GAP: 7 (ref 5–15)
BUN: 9 mg/dL (ref 6–20)
CALCIUM: 9.6 mg/dL (ref 8.9–10.3)
CO2: 29 mmol/L (ref 22–32)
Chloride: 105 mmol/L (ref 98–111)
Creatinine, Ser: 0.61 mg/dL (ref 0.44–1.00)
GFR calc Af Amer: >60 mL/min (ref >60)
Glucose, Bld: 108 mg/dL — ABNORMAL HIGH (ref 70–99)
Potassium: 3.1 mmol/L — ABNORMAL LOW (ref 3.5–5.1)
Sodium: 141 mmol/L (ref 135–145)

## 2017-10-14 LAB — TYPE AND SCREEN
ABO/RH(D): O POS
ANTIBODY SCREEN: NEGATIVE

## 2017-10-14 LAB — CBC
HCT: 43.8 % (ref 35.0–47.0)
HEMOGLOBIN: 14.9 g/dL (ref 12.0–16.0)
MCH: 31.4 pg (ref 26.0–34.0)
MCHC: 34 g/dL (ref 32.0–36.0)
MCV: 92.5 fL (ref 80.0–100.0)
PLATELETS: 213 10*3/uL (ref 150–440)
RBC: 4.74 MIL/uL (ref 3.80–5.20)
RDW: 13.9 % (ref 11.5–14.5)
WBC: 10 10*3/uL (ref 3.6–11.0)

## 2017-10-14 NOTE — Pre-Procedure Instructions (Addendum)
KT 3.1 FAXED TO DR Ouida Sills EKG'S REVIEWED BY DR Rosey Bath AND OK

## 2017-10-14 NOTE — Patient Instructions (Signed)
Your procedure is scheduled on: Monday 10/21/17 Report to [facility]. To find out your arrival time please call [phone] between 1PM - 3PM on Friday 10/14/17.  Remember: Instructions that are not followed completely may result in serious medical risk, up to and including death, or upon the discretion of your surgeon and anesthesiologist your surgery may need to be rescheduled.     _X__ 1. Do not eat food after midnight the night before your procedure.                 No gum chewing or hard candies. You may drink clear liquids up to 2 hours                 before you are scheduled to arrive for your surgery- DO not drink clear                 liquids within 2 hours of the start of your surgery.                 Clear Liquids include:  water, apple juice without pulp, clear carbohydrate                 drink such as Clearfast or Gatorade, Black Coffee or Tea (Do not add                 anything to coffee or tea).  __X__2.  On the morning of surgery brush your teeth with toothpaste and water, you                 may rinse your mouth with mouthwash if you wish.  Do not swallow any              toothpaste of mouthwash.     _X__ 3.  No Alcohol for 24 hours before or after surgery.   _X__ 4.  Do Not Smoke or use e-cigarettes For 24 Hours Prior to Your Surgery.                 Do not use any chewable tobacco products for at least 6 hours prior to                 surgery.  ____  5.  Bring all medications with you on the day of surgery if instructed.   __X__  6.  Notify your doctor if there is any change in your medical condition      (cold, fever, infections).     Do not wear jewelry, make-up, hairpins, clips or nail polish. Do not wear lotions, powders, or perfumes.  Do not shave 48 hours prior to surgery. Men may shave face and neck. Do not bring valuables to the hospital.    [facility] is not responsible for any belongings or  valuables.  Contacts, dentures/partials or body piercings may not be worn into surgery. Bring a case for your contacts, glasses or hearing aids, a denture cup will be supplied. Leave your suitcase in the car. After surgery it may be brought to your room. For patients admitted to the hospital, discharge time is determined by your treatment team.   Patients discharged the day of surgery will not be allowed to drive home.   Please read over the following fact sheets that you were given:   MRSA Information  __X__ Take these medicines the morning of surgery with A SIP OF WATER:  1. None  2. Take meds as usual except naproxen sodium (ALEVE)  3.   4.  5.  6.  ____ Fleet Enema (as directed)   __X__ Use CHG Soap/SAGE wipes as directed  ____ Use inhalers on the day of surgery  ____ Stop metformin/Janumet/Farxiga 2 days prior to surgery    ____ Take 1/2 of usual insulin dose the night before surgery. No insulin the morning          of surgery.   ____ Stop Blood Thinners Coumadin/Plavix/Xarelto/Pleta/Pradaxa/Eliquis/Effient/Aspirin  on   Or contact your Surgeon, Cardiologist or Medical Doctor regarding  ability to stop your blood thinners  __X__ Stop Anti-inflammatories 7 days before surgery such as Advil, Ibuprofen, Motrin,  BC or Goodies Powder, Naprosyn, Naproxen, Aleve, Aspirin YOU MAY TAKE TYLENOL IF NEEDED    __X__ Stop all herbal supplements, fish oil or vitamin E until after surgery.    ____ Bring C-Pap to the hospital.

## 2017-10-21 ENCOUNTER — Observation Stay
Admission: RE | Admit: 2017-10-21 | Discharge: 2017-10-22 | Disposition: A | Discharge status: Home / Self Care | Payer: Medicare HMO | Source: Ambulatory Visit | Attending: Obstetrics and Gynecology | Admitting: Obstetrics and Gynecology

## 2017-10-21 ENCOUNTER — Encounter: Payer: Self-pay | Admitting: *Deleted

## 2017-10-21 ENCOUNTER — Ambulatory Visit: Payer: Medicare HMO | Admitting: Anesthesiology

## 2017-10-21 ENCOUNTER — Encounter
Admission: RE | Disposition: A | Discharge status: Home / Self Care | Payer: Self-pay | Source: Ambulatory Visit | Attending: Obstetrics and Gynecology

## 2017-10-21 ENCOUNTER — Other Ambulatory Visit: Payer: Self-pay

## 2017-10-21 DIAGNOSIS — Z888 Allergy status to other drugs, medicaments and biological substances status: Secondary | ICD-10-CM | POA: Insufficient documentation

## 2017-10-21 DIAGNOSIS — M1990 Unspecified osteoarthritis, unspecified site: Secondary | ICD-10-CM | POA: Diagnosis not present

## 2017-10-21 DIAGNOSIS — E559 Vitamin D deficiency, unspecified: Secondary | ICD-10-CM | POA: Insufficient documentation

## 2017-10-21 DIAGNOSIS — Z8249 Family history of ischemic heart disease and other diseases of the circulatory system: Secondary | ICD-10-CM | POA: Diagnosis not present

## 2017-10-21 DIAGNOSIS — Z8269 Family history of other diseases of the musculoskeletal system and connective tissue: Secondary | ICD-10-CM | POA: Insufficient documentation

## 2017-10-21 DIAGNOSIS — N802 Endometriosis of fallopian tube: Secondary | ICD-10-CM | POA: Diagnosis not present

## 2017-10-21 DIAGNOSIS — Z885 Allergy status to narcotic agent status: Secondary | ICD-10-CM | POA: Diagnosis not present

## 2017-10-21 DIAGNOSIS — G7111 Myotonic muscular dystrophy: Secondary | ICD-10-CM | POA: Insufficient documentation

## 2017-10-21 DIAGNOSIS — G8929 Other chronic pain: Secondary | ICD-10-CM | POA: Diagnosis not present

## 2017-10-21 DIAGNOSIS — Z9889 Other specified postprocedural states: Secondary | ICD-10-CM

## 2017-10-21 DIAGNOSIS — Z8349 Family history of other endocrine, nutritional and metabolic diseases: Secondary | ICD-10-CM | POA: Insufficient documentation

## 2017-10-21 DIAGNOSIS — N870 Mild cervical dysplasia: Secondary | ICD-10-CM | POA: Diagnosis not present

## 2017-10-21 DIAGNOSIS — N838 Other noninflammatory disorders of ovary, fallopian tube and broad ligament: Secondary | ICD-10-CM | POA: Insufficient documentation

## 2017-10-21 DIAGNOSIS — E785 Hyperlipidemia, unspecified: Secondary | ICD-10-CM | POA: Insufficient documentation

## 2017-10-21 DIAGNOSIS — D251 Intramural leiomyoma of uterus: Secondary | ICD-10-CM | POA: Diagnosis not present

## 2017-10-21 DIAGNOSIS — N736 Female pelvic peritoneal adhesions (postinfective): Secondary | ICD-10-CM | POA: Insufficient documentation

## 2017-10-21 DIAGNOSIS — N888 Other specified noninflammatory disorders of cervix uteri: Secondary | ICD-10-CM | POA: Insufficient documentation

## 2017-10-21 DIAGNOSIS — D270 Benign neoplasm of right ovary: Secondary | ICD-10-CM | POA: Insufficient documentation

## 2017-10-21 DIAGNOSIS — D250 Submucous leiomyoma of uterus: Secondary | ICD-10-CM | POA: Diagnosis not present

## 2017-10-21 DIAGNOSIS — F419 Anxiety disorder, unspecified: Secondary | ICD-10-CM | POA: Insufficient documentation

## 2017-10-21 DIAGNOSIS — M545 Low back pain: Secondary | ICD-10-CM | POA: Diagnosis not present

## 2017-10-21 DIAGNOSIS — E7800 Pure hypercholesterolemia, unspecified: Secondary | ICD-10-CM | POA: Diagnosis not present

## 2017-10-21 DIAGNOSIS — Z886 Allergy status to analgesic agent status: Secondary | ICD-10-CM | POA: Insufficient documentation

## 2017-10-21 DIAGNOSIS — R102 Pelvic and perineal pain: Secondary | ICD-10-CM | POA: Insufficient documentation

## 2017-10-21 DIAGNOSIS — N930 Postcoital and contact bleeding: Secondary | ICD-10-CM | POA: Diagnosis not present

## 2017-10-21 DIAGNOSIS — N72 Inflammatory disease of cervix uteri: Principal | ICD-10-CM | POA: Insufficient documentation

## 2017-10-21 DIAGNOSIS — Z79899 Other long term (current) drug therapy: Secondary | ICD-10-CM | POA: Insufficient documentation

## 2017-10-21 DIAGNOSIS — K589 Irritable bowel syndrome without diarrhea: Secondary | ICD-10-CM | POA: Insufficient documentation

## 2017-10-21 HISTORY — PX: CYSTOSCOPY: SHX5120

## 2017-10-21 HISTORY — PX: LAPAROSCOPIC UNILATERAL SALPINGECTOMY: SHX5934

## 2017-10-21 HISTORY — PX: LAPAROSCOPIC VAGINAL HYSTERECTOMY WITH SALPINGO OOPHORECTOMY: SHX6681

## 2017-10-21 LAB — ABO/RH: ABO/RH(D): O POS

## 2017-10-21 SURGERY — HYSTERECTOMY, VAGINAL, LAPAROSCOPY-ASSISTED, WITH SALPINGO-OOPHORECTOMY
Anesthesia: General | Laterality: Right

## 2017-10-21 MED ORDER — OXYCODONE HCL 5 MG/5ML PO SOLN: 5.0000 mg | Freq: Once | ORAL | Status: DC | PRN

## 2017-10-21 MED ORDER — FENTANYL CITRATE (PF) 100 MCG/2ML IJ SOLN
25.0000 ug | INTRAMUSCULAR | Status: DC | PRN
  Administered 2017-10-21: 50 ug via INTRAVENOUS

## 2017-10-21 MED ORDER — ONDANSETRON HCL 4 MG PO TABS: 4.0000 mg | ORAL_TABLET | Freq: Four times a day (QID) | ORAL | Status: DC | PRN

## 2017-10-21 MED ORDER — FENTANYL CITRATE (PF) 250 MCG/5ML IJ SOLN
INTRAMUSCULAR | Status: AC
  Filled 2017-10-21: qty 5

## 2017-10-21 MED ORDER — FENTANYL CITRATE (PF) 100 MCG/2ML IJ SOLN
INTRAMUSCULAR | Status: DC | PRN
  Administered 2017-10-21 (×2): 50 ug via INTRAVENOUS
  Administered 2017-10-21: 100 ug via INTRAVENOUS

## 2017-10-21 MED ORDER — ROCURONIUM BROMIDE 100 MG/10ML IV SOLN
INTRAVENOUS | Status: DC | PRN
  Administered 2017-10-21: 30 mg via INTRAVENOUS

## 2017-10-21 MED ORDER — SUGAMMADEX SODIUM 500 MG/5ML IV SOLN
INTRAVENOUS | Status: DC | PRN
  Administered 2017-10-21 (×2): 200 mg via INTRAVENOUS

## 2017-10-21 MED ORDER — LACTATED RINGERS IV SOLN
INTRAVENOUS | Status: DC
  Administered 2017-10-21 – 2017-10-22 (×2): via INTRAVENOUS

## 2017-10-21 MED ORDER — MEPERIDINE HCL 50 MG PO TABS
100.0000 mg | ORAL_TABLET | ORAL | Status: DC | PRN
  Administered 2017-10-21 – 2017-10-22 (×4): 100 mg via ORAL
  Filled 2017-10-21 (×5): qty 2

## 2017-10-21 MED ORDER — CEFAZOLIN SODIUM-DEXTROSE 2-4 GM/100ML-% IV SOLN
2.0000 g | Freq: Once | INTRAVENOUS | Status: AC
  Administered 2017-10-21: 2 g via INTRAVENOUS

## 2017-10-21 MED ORDER — ONDANSETRON HCL 4 MG/2ML IJ SOLN
INTRAMUSCULAR | Status: AC
  Filled 2017-10-21: qty 2

## 2017-10-21 MED ORDER — OXYCODONE-ACETAMINOPHEN 5-325 MG PO TABS: 1.0000 | ORAL_TABLET | ORAL | Status: DC | PRN

## 2017-10-21 MED ORDER — OXYCODONE HCL 5 MG PO TABS: 5.0000 mg | ORAL_TABLET | Freq: Once | ORAL | Status: DC | PRN

## 2017-10-21 MED ORDER — LACTATED RINGERS IV SOLN
INTRAVENOUS | Status: DC
  Administered 2017-10-21: 07:00:00 via INTRAVENOUS

## 2017-10-21 MED ORDER — SOD CITRATE-CITRIC ACID 500-334 MG/5ML PO SOLN
30.0000 mL | ORAL | Status: DC
  Filled 2017-10-21: qty 30

## 2017-10-21 MED ORDER — DEXAMETHASONE SODIUM PHOSPHATE 10 MG/ML IJ SOLN
INTRAMUSCULAR | Status: DC | PRN
  Administered 2017-10-21: 10 mg via INTRAVENOUS

## 2017-10-21 MED ORDER — LACTATED RINGERS IV SOLN
INTRAVENOUS | Status: DC | PRN
  Administered 2017-10-21: 08:00:00 via INTRAVENOUS

## 2017-10-21 MED ORDER — MEPERIDINE HCL 50 MG/ML IJ SOLN
50.0000 mg | INTRAMUSCULAR | Status: DC | PRN
  Administered 2017-10-21 (×3): 50 mg via INTRAVENOUS
  Filled 2017-10-21 (×3): qty 1

## 2017-10-21 MED ORDER — ONDANSETRON HCL 4 MG/2ML IJ SOLN
INTRAMUSCULAR | Status: DC | PRN
  Administered 2017-10-21: 4 mg via INTRAVENOUS

## 2017-10-21 MED ORDER — LACTATED RINGERS IV SOLN: INTRAVENOUS | Status: DC

## 2017-10-21 MED ORDER — ONDANSETRON HCL 4 MG/2ML IJ SOLN: 4.0000 mg | Freq: Four times a day (QID) | INTRAMUSCULAR | Status: DC | PRN

## 2017-10-21 MED ORDER — KETOROLAC TROMETHAMINE 30 MG/ML IJ SOLN
INTRAMUSCULAR | Status: DC | PRN
  Administered 2017-10-21: 30 mg via INTRAVENOUS

## 2017-10-21 MED ORDER — LIDOCAINE-EPINEPHRINE 1 %-1:100000 IJ SOLN
INTRAMUSCULAR | Status: DC | PRN
  Administered 2017-10-21: 7 mL

## 2017-10-21 MED ORDER — BUPIVACAINE HCL 0.5 % IJ SOLN
INTRAMUSCULAR | Status: DC | PRN
  Administered 2017-10-21: 10 mL

## 2017-10-21 MED ORDER — CEFAZOLIN SODIUM-DEXTROSE 2-4 GM/100ML-% IV SOLN
INTRAVENOUS | Status: AC
  Filled 2017-10-21: qty 100

## 2017-10-21 MED ORDER — LIDOCAINE-EPINEPHRINE 1 %-1:100000 IJ SOLN
INTRAMUSCULAR | Status: AC
  Filled 2017-10-21: qty 1

## 2017-10-21 MED ORDER — FENTANYL CITRATE (PF) 100 MCG/2ML IJ SOLN
INTRAMUSCULAR | Status: AC
  Administered 2017-10-21: 50 ug via INTRAVENOUS
  Filled 2017-10-21: qty 2

## 2017-10-21 MED ORDER — BUPIVACAINE HCL (PF) 0.5 % IJ SOLN
INTRAMUSCULAR | Status: AC
  Filled 2017-10-21: qty 30

## 2017-10-21 MED ORDER — PROPOFOL 10 MG/ML IV BOLUS
INTRAVENOUS | Status: DC | PRN
  Administered 2017-10-21: 180 mg via INTRAVENOUS

## 2017-10-21 MED ORDER — LIDOCAINE HCL (CARDIAC) PF 100 MG/5ML IV SOSY
PREFILLED_SYRINGE | INTRAVENOUS | Status: DC | PRN
  Administered 2017-10-21: 100 mg via INTRAVENOUS

## 2017-10-21 MED ORDER — KETOROLAC TROMETHAMINE 30 MG/ML IJ SOLN
30.0000 mg | Freq: Three times a day (TID) | INTRAMUSCULAR | Status: DC
  Administered 2017-10-21 – 2017-10-22 (×2): 30 mg via INTRAVENOUS
  Filled 2017-10-21 (×2): qty 1

## 2017-10-21 MED ORDER — FLUORESCEIN SODIUM 10 % IV SOLN
INTRAVENOUS | Status: AC
  Filled 2017-10-21: qty 5

## 2017-10-21 MED ORDER — PROPOFOL 10 MG/ML IV BOLUS
INTRAVENOUS | Status: AC
  Filled 2017-10-21: qty 20

## 2017-10-21 MED ORDER — MIDAZOLAM HCL 2 MG/2ML IJ SOLN
INTRAMUSCULAR | Status: AC
  Filled 2017-10-21: qty 2

## 2017-10-21 MED ORDER — FAMOTIDINE 20 MG PO TABS
20.0000 mg | ORAL_TABLET | Freq: Once | ORAL | Status: AC
  Administered 2017-10-21: 20 mg via ORAL

## 2017-10-21 MED ORDER — MIDAZOLAM HCL 2 MG/2ML IJ SOLN
INTRAMUSCULAR | Status: DC | PRN
  Administered 2017-10-21: 2 mg via INTRAVENOUS

## 2017-10-21 MED ORDER — FAMOTIDINE 20 MG PO TABS
ORAL_TABLET | ORAL | Status: AC
  Filled 2017-10-21: qty 1

## 2017-10-21 SURGICAL SUPPLY — 63 items
BAG URINE DRAINAGE (UROLOGICAL SUPPLIES) ×5 IMPLANT
BLADE SURG SZ11 CARB STEEL (BLADE) ×5 IMPLANT
CANISTER SUCT 1200ML W/VALVE (MISCELLANEOUS) ×5 IMPLANT
CATH FOLEY 2WAY  5CC 16FR (CATHETERS) ×2
CATH ROBINSON RED A/P 16FR (CATHETERS) ×5 IMPLANT
CATH URTH 16FR FL 2W BLN LF (CATHETERS) ×3 IMPLANT
CHLORAPREP W/TINT 26ML (MISCELLANEOUS) ×5 IMPLANT
CLOSURE WOUND 1/2 X4 (GAUZE/BANDAGES/DRESSINGS) ×1
CLOSURE WOUND 1/4X4 (GAUZE/BANDAGES/DRESSINGS) ×1
DERMABOND ADVANCED (GAUZE/BANDAGES/DRESSINGS) ×2
DERMABOND ADVANCED .7 DNX12 (GAUZE/BANDAGES/DRESSINGS) ×3 IMPLANT
DRAPE SURG 17X11 SM STRL (DRAPES) ×5 IMPLANT
DRSG TEGADERM 2-3/8X2-3/4 SM (GAUZE/BANDAGES/DRESSINGS) ×20 IMPLANT
ELECT REM PT RETURN 9FT ADLT (ELECTROSURGICAL) ×5
ELECTRODE REM PT RTRN 9FT ADLT (ELECTROSURGICAL) ×3 IMPLANT
FILTER LAP SMOKE EVAC STRL (MISCELLANEOUS) ×5 IMPLANT
GLOVE BIO SURGEON STRL SZ8 (GLOVE) ×40 IMPLANT
GOWN STRL REUS W/ TWL LRG LVL3 (GOWN DISPOSABLE) ×9 IMPLANT
GOWN STRL REUS W/ TWL XL LVL3 (GOWN DISPOSABLE) ×9 IMPLANT
GOWN STRL REUS W/TWL LRG LVL3 (GOWN DISPOSABLE) ×6
GOWN STRL REUS W/TWL XL LVL3 (GOWN DISPOSABLE) ×6
GRASPER SUT TROCAR 14GX15 (MISCELLANEOUS) ×5 IMPLANT
HANDLE YANKAUER SUCT BULB TIP (MISCELLANEOUS) ×5 IMPLANT
IRRIGATION STRYKERFLOW (MISCELLANEOUS) ×3 IMPLANT
IRRIGATOR STRYKERFLOW (MISCELLANEOUS) ×5
IV NS 1000ML (IV SOLUTION) ×2
IV NS 1000ML BAXH (IV SOLUTION) ×3 IMPLANT
KIT PINK PAD W/HEAD ARE REST (MISCELLANEOUS) ×5
KIT PINK PAD W/HEAD ARM REST (MISCELLANEOUS) ×3 IMPLANT
KIT TURNOVER CYSTO (KITS) ×5 IMPLANT
LABEL OR SOLS (LABEL) ×5 IMPLANT
NEEDLE HYPO 22GX1.5 SAFETY (NEEDLE) ×5 IMPLANT
NS IRRIG 500ML POUR BTL (IV SOLUTION) ×5 IMPLANT
PACK BASIN MINOR ARMC (MISCELLANEOUS) ×5 IMPLANT
PACK GYN LAPAROSCOPIC (MISCELLANEOUS) ×5 IMPLANT
PAD OB MATERNITY 4.3X12.25 (PERSONAL CARE ITEMS) ×5 IMPLANT
PAD PREP 24X41 OB/GYN DISP (PERSONAL CARE ITEMS) ×5 IMPLANT
POUCH SPECIMEN RETRIEVAL 10MM (ENDOMECHANICALS) IMPLANT
SCISSORS METZENBAUM CVD 33 (INSTRUMENTS) IMPLANT
SHEARS HARMONIC ACE PLUS 36CM (ENDOMECHANICALS) ×5 IMPLANT
SLEEVE ENDOPATH XCEL 5M (ENDOMECHANICALS) ×10 IMPLANT
SPONGE GAUZE 2X2 8PLY STER LF (GAUZE/BANDAGES/DRESSINGS) ×3
SPONGE GAUZE 2X2 8PLY STRL LF (GAUZE/BANDAGES/DRESSINGS) ×12 IMPLANT
SPONGE XRAY 4X4 16PLY STRL (MISCELLANEOUS) ×5 IMPLANT
STRIP CLOSURE SKIN 1/2X4 (GAUZE/BANDAGES/DRESSINGS) ×4 IMPLANT
STRIP CLOSURE SKIN 1/4X4 (GAUZE/BANDAGES/DRESSINGS) ×4 IMPLANT
SUT VIC AB 0 CT1 27 (SUTURE) ×4
SUT VIC AB 0 CT1 27XCR 8 STRN (SUTURE) ×6 IMPLANT
SUT VIC AB 0 CT1 36 (SUTURE) ×10 IMPLANT
SUT VIC AB 0 CT2 27 (SUTURE) IMPLANT
SUT VIC AB 2-0 CT1 27 (SUTURE) ×2
SUT VIC AB 2-0 CT1 TAPERPNT 27 (SUTURE) ×3 IMPLANT
SUT VIC AB 2-0 UR6 27 (SUTURE) IMPLANT
SUT VIC AB 4-0 SH 27 (SUTURE) ×2
SUT VIC AB 4-0 SH 27XANBCTRL (SUTURE) ×3 IMPLANT
SWABSTK COMLB BENZOIN TINCTURE (MISCELLANEOUS) ×5 IMPLANT
SYR 10ML LL (SYRINGE) ×5 IMPLANT
SYR CONTROL 10ML (SYRINGE) ×5 IMPLANT
TROCAR ENDO BLADELESS 11MM (ENDOMECHANICALS) IMPLANT
TROCAR XCEL NON-BLD 5MMX100MML (ENDOMECHANICALS) ×5 IMPLANT
TROCAR XCEL UNIV SLVE 11M 100M (ENDOMECHANICALS) IMPLANT
TUBING INSUF HEATED (TUBING) ×5 IMPLANT
TUBING INSUFFLATION (TUBING) ×5 IMPLANT

## 2017-10-21 NOTE — Progress Notes (Signed)
Patient ID: Sheryl Barker, female   DOB: 29-Aug-1975, 42 y.o.   MRN: [id number] DOS , doing well  po Demerol working well VSS  OU good . ABd non distended  A: slow IV rate  Anticipate d/c in am

## 2017-10-21 NOTE — Anesthesia Post-op Follow-up Note (Signed)
Anesthesia QCDR form completed.        

## 2017-10-21 NOTE — Anesthesia Preprocedure Evaluation (Addendum)
Anesthesia Evaluation  Patient identified by MRN, date of birth, ID band Patient awake    Reviewed: Allergy & Precautions, H&P , NPO status , Patient's Chart, lab work & pertinent test results  History of Anesthesia Complications Negative for: history of anesthetic complications  Airway Mallampati: II  TM Distance: <3 FB Neck ROM: full    Dental  (+) Chipped, Poor Dentition   Pulmonary neg pulmonary ROS, neg shortness of breath,           Cardiovascular Exercise Tolerance: Good (-) angina(-) Past MI and (-) DOE negative cardio ROS       Neuro/Psych PSYCHIATRIC DISORDERS Anxiety Depression  Neuromuscular disease negative psych ROS   GI/Hepatic negative GI ROS, Neg liver ROS, neg GERD  ,  Endo/Other  negative endocrine ROS  Renal/GU      Musculoskeletal  (+) Arthritis ,   Abdominal   Peds  Hematology negative hematology ROS (+)   Anesthesia Other Findings Patient has muscular dystrophy diagnosis.  She is unaware of which type.  She reports no problems with past anesthetics.  She reports no weakness other that weakness and pain associated with her back problem.  Past Medical History: No date: Anxiety No date: Broken ankle     Comment:  right 11/16 05/04/2014: Chest pain No date: Depression No date: Fibroid No date: Hypercholesteremia No date: IBS (irritable bowel syndrome) No date: MD (muscular dystrophy) ([place]) No date: MD (muscular dystrophy) ([place]) No date: Muscular dystrophy ([place]) 01/07/2015: Vitamin D insufficiency  Past Surgical History: 01/2017: BREAST BIOPSY; Right     Comment:  Fibroadenoma No date: Thermal avu No date: TUBAL LIGATION  BMI    Body Mass Index:  34.09 kg/m      Reproductive/Obstetrics negative OB ROS                            Anesthesia Physical Anesthesia Plan  ASA: III  Anesthesia Plan: General ETT   Post-op Pain Management:    Induction:  Intravenous  PONV Risk Score and Plan: Ondansetron, Dexamethasone and Midazolam  Airway Management Planned: Oral ETT  Additional Equipment:   Intra-op Plan:   Post-operative Plan: Extubation in OR  Informed Consent: I have reviewed the patients History and Physical, chart, labs and discussed the procedure including the risks, benefits and alternatives for the proposed anesthesia with the patient or authorized representative who has indicated his/her understanding and acceptance.   Dental Advisory Given  Plan Discussed with: Anesthesiologist, CRNA and Surgeon  Anesthesia Plan Comments: (Patient consented for risks of anesthesia including but not limited to:  - adverse reactions to medications - damage to teeth, lips or other oral mucosa - sore throat or hoarseness - Damage to heart, brain, lungs or loss of life  Patient voiced understanding.)        Anesthesia Quick Evaluation

## 2017-10-21 NOTE — Anesthesia Procedure Notes (Signed)
Procedure Name: Intubation Date/Time: 10/21/2017 7:55 AM Performed by: Justus Memory, CRNA Pre-anesthesia Checklist: Patient identified, Patient being monitored, Timeout performed, Emergency Drugs available and Suction available Patient Re-evaluated:Patient Re-evaluated prior to induction Oxygen Delivery Method: Circle system utilized Preoxygenation: Pre-oxygenation with 100% oxygen Induction Type: IV induction Ventilation: Mask ventilation without difficulty Laryngoscope Size: Mac and 3 Grade View: Grade I Tube type: Oral Tube size: 7.0 mm Number of attempts: 1 Airway Equipment and Method: Stylet Placement Confirmation: ETT inserted through vocal cords under direct vision,  positive ETCO2 and breath sounds checked- equal and bilateral Secured at: 21 cm Tube secured with: Tape Dental Injury: Teeth and Oropharynx as per pre-operative assessment

## 2017-10-21 NOTE — Brief Op Note (Signed)
10/21/2017  10:18 AM  PATIENT:  Sheryl Barker  42 y.o. female  PRE-OPERATIVE DIAGNOSIS:  right ovarian cyst, post coital bleeding post ablation  POST-OPERATIVE DIAGNOSIS:  right ovarian cyst, post coital bleeding post ablation  PROCEDURE:  Procedure(s): LAPAROSCOPIC ASSISTED VAGINAL HYSTERECTOMY WITH SALPINGO OOPHORECTOMY (Right) LAPAROSCOPIC UNILATERAL SALPINGECTOMY (Left)  SURGEON:  Surgeon(s) and Role:    * Donta Mcinroy, Gwen Her, MD - Primary    * Ward, Honor Loh, MD - Assisting  PHYSICIAN ASSISTANT:   ASSISTANTS: none   ANESTHESIA:   general  EBL:  50 cc IOF 1000 cc uo 100 cc   BLOOD ADMINISTERED:none  DRAINS: Urinary Catheter (Foley)   LOCAL MEDICATIONS USED:  MARCAINE     SPECIMEN:  Source of Specimen:  cervix uterus bilateral tubes and right ovary  DISPOSITION OF SPECIMEN:  PATHOLOGY  COUNTS:  YES  TOURNIQUET:  * No tourniquets in log *  DICTATION: .Other Dictation: Dictation Number verbal  PLAN OF CARE: Admit for overnight observation  PATIENT DISPOSITION:  PACU - hemodynamically stable.   Delay start of Pharmacological VTE agent (>24hrs) due to surgical blood loss or risk of bleeding: not applicable

## 2017-10-21 NOTE — Op Note (Signed)
NAME: Sheryl Barker, Sheryl Barker MEDICAL RECORD DG:[id number] ACCOUNT [id number] DATE OF BIRTH:[birth year] FACILITY: [facility] LOCATION: [facility] PHYSICIAN:THOMAS Josefine Class, MD  OPERATIVE REPORT  DATE OF PROCEDURE:  10/21/2017  PREOPERATIVE DIAGNOSES: 1.  Right pelvic pain with known right ovarian cyst. 2.  Postcoital bleeding, status post endometrial ablation.  POSTOPERATIVE DIAGNOSES: 1.  Right pelvic pain with known right ovarian cyst. 2.  Postcoital bleeding, status post endometrial ablation.  PROCEDURE: 1.  Laparoscopic-assisted vaginal hysterectomy. 2.  Right salpingo-oophorectomy. 3.  Left salpingectomy. 4.  Cystoscopy.  ANESTHESIA:  General endotracheal anesthesia.  SURGEON:  Laverta Baltimore, MD  FIRST ASSISTANT:  Larey Days, MD  INDICATIONS:  A 42 year old female with a long history of right-sided pelvic pain.  The patient was noted to have an enlarged right ovary.  The patient also complains of postcoital spotting, and she is status post an endometrial ablation.  DESCRIPTION OF PROCEDURE:  After adequate general endotracheal anesthesia, the patient was placed in dorsal supine position with the legs in the [place] stirrups.  The patient's abdomen, perineum and vagina were prepped and draped in normal sterile  fashion.  The patient did receive 2 g IV Ancef prior to commencement of the case.  Timeout was performed.  Straight catheterization of the bladder yielded 50 mL clear urine.  Gloves were changed.  Attention was directed to the patient's abdomen where a 5  mm infraumbilical incision was made, and the laparoscope was advanced into the abdominal cavity under direct visualization with the Optiview cannula.  The patient was placed in Trendelenburg.  Second and third port sites were placed.  A second port site  was placed at the left lower quadrant 3 cm medial to the left anterior iliac spine.  A 5 mm trocar was advanced under direct visualization.  A similar procedure was  repeated on the right side, again 3 cm medial to the right anterior iliac spine.  Of  note, there were some flimsy adhesions of the right colon to the right sidewall.  These were taken down partially with the Harmonic scalpel.  Attention was directed to the patient's right ovary, which was approximately 6 cm in size.  A needle aspirator  was placed into the right ovarian cyst, and clear fluid resulted and the ovary was decompressed.  This allowed for better visualization of the infundibulopelvic ligament.  A Harmonic scalpel was used, and the Bovie cautery was used to cauterize the right  infundibulopelvic ligament.  Dissection through the broad ligament ensued with the Harmonic scalpel.  The right round ligament was opened, and the right uterine artery was skeletonized after creating a partial bladder flap.  The uterine artery was  cauterized and transected with the Harmonic scalpel.  A similar procedure was repeated on the left side; however, the left ovary was kept in place, and the left mesosalpinx was cauterized and dissected.  The left round ligament was opened, and again the  left uterine artery was skeletonized, cauterized and transected.  Upper abdomen appeared normal.  Good hemostasis was noted.  Procedure then ensued vaginally, and the cervix was grasped with 2 thyroid tenacula.  A weighted speculum was placed in the  posterior vaginal vault.  The posterior cul-de-sac was entered sharply after injecting the cervix circumferentially with 1% lidocaine with 1:100,000 epinephrine(  approx 8 cc used).  Once gaining entry into the posterior cul-de-sac, the long-billed weighted speculum was  placed.  The uterosacral ligaments were bilaterally clamped, transected, suture ligated with 0 Vicryl suture.  Cardinal ligaments were bilaterally  clamped, transected, suture ligated with 0 Vicryl suture, and several bites were then made in the broad  ligament, transected, suture ligated.  The cervix, uterus, right  fallopian tube and ovary and left fallopian tube were then delivered through the vagina.  Good hemostasis was noted.  The vaginal cuff was closed with a running 0 Vicryl suture, and the  uterosacral ligaments were plicated centrally and the rest of the vaginal cuff was closed.  Attention was then redirected from the abdomen with the laparoscope.  Good hemostasis was noted.  Irrigation was performed, and the intraabdominal pressure was  brought down to 7 mmHg with good hemostasis noted.  The course of the ureters could not be clearly identified due to anatomic considerations.  Therefore, a cystoscopy was performed at the end of the case, and there was normal ureteral efflux noted  bilaterally.  A Foley catheter was placed at the end of the case.  The patient's abdomen was then deflated, and all incisions were closed with a single 4-0 Vicryl suture.  Dermabond placed over top of these incisions.  There were no complications.  ESTIMATED BLOOD LOSS:  50 mL.  URINE OUTPUT:  100 mL.  INTRAOPERATIVE FLUIDS:  1000 mL.  The patient did receive 30 mg of IV Toradol prior to leaving the operating room.  The patient was taken to recovery room in good condition.  LN/NUANCE  D:10/21/2017 T:10/21/2017 JOB:[id number]

## 2017-10-21 NOTE — Progress Notes (Signed)
READY TO PROCEED lavh AND RIGHT SALPINGOOPHORECTOMY AND LEFT SALPINGECTOMY . NEFG hcg LABS REVIEWED . aLL QUESTIONS ANSWERED . pROCEED

## 2017-10-21 NOTE — Transfer of Care (Signed)
Immediate Anesthesia Transfer of Care Note  Patient: Sheryl Barker  Procedure(s) Performed: LAPAROSCOPIC ASSISTED VAGINAL HYSTERECTOMY WITH SALPINGO OOPHORECTOMY (Right ) LAPAROSCOPIC UNILATERAL SALPINGECTOMY (Left ) CYSTOSCOPY  Patient Location: PACU  Anesthesia Type:General  Level of Consciousness: sedated  Airway & Oxygen Therapy: Patient Spontanous Breathing and Patient connected to face mask oxygen  Post-op Assessment: Report given to RN and Post -op Vital signs reviewed and stable  Post vital signs: Reviewed and stable  Last Vitals:  Vitals Value Taken Time  BP 125/74 10/21/2017 10:53 AM  Temp    Pulse 82 10/21/2017 10:56 AM  Resp    SpO2 91 % 10/21/2017 10:56 AM  Vitals shown include unvalidated device data.  Last Pain:  Vitals:   10/21/17 1030  TempSrc:   PainSc: 8          Complications: No apparent anesthesia complications

## 2017-10-21 NOTE — Anesthesia Postprocedure Evaluation (Signed)
Anesthesia Post Note  Patient: Ariabella Brien Haberman  Procedure(s) Performed: LAPAROSCOPIC ASSISTED VAGINAL HYSTERECTOMY WITH SALPINGO OOPHORECTOMY (Right ) LAPAROSCOPIC UNILATERAL SALPINGECTOMY (Left ) CYSTOSCOPY  Patient location during evaluation: PACU Anesthesia Type: General Level of consciousness: awake and alert Pain management: pain level controlled Vital Signs Assessment: post-procedure vital signs reviewed and stable Respiratory status: spontaneous breathing, nonlabored ventilation, respiratory function stable and patient connected to nasal cannula oxygen Cardiovascular status: blood pressure returned to baseline and stable Postop Assessment: no apparent nausea or vomiting Anesthetic complications: no     Last Vitals:  Vitals:   10/21/17 1130 10/21/17 1209  BP: 113/72 102/62  Pulse: 77   Resp: (!) 21   Temp: (!) 36.4 C 36.8 C  SpO2: 91% 94%    Last Pain:  Vitals:   10/21/17 1209  TempSrc: Oral  PainSc:                  Precious Haws Burnett Lieber

## 2017-10-22 DIAGNOSIS — D250 Submucous leiomyoma of uterus: Secondary | ICD-10-CM | POA: Diagnosis not present

## 2017-10-22 DIAGNOSIS — N736 Female pelvic peritoneal adhesions (postinfective): Secondary | ICD-10-CM | POA: Diagnosis not present

## 2017-10-22 DIAGNOSIS — E7800 Pure hypercholesterolemia, unspecified: Secondary | ICD-10-CM | POA: Diagnosis not present

## 2017-10-22 DIAGNOSIS — N888 Other specified noninflammatory disorders of cervix uteri: Secondary | ICD-10-CM | POA: Diagnosis not present

## 2017-10-22 DIAGNOSIS — E785 Hyperlipidemia, unspecified: Secondary | ICD-10-CM | POA: Diagnosis not present

## 2017-10-22 DIAGNOSIS — N838 Other noninflammatory disorders of ovary, fallopian tube and broad ligament: Secondary | ICD-10-CM | POA: Diagnosis not present

## 2017-10-22 DIAGNOSIS — N802 Endometriosis of fallopian tube: Secondary | ICD-10-CM | POA: Diagnosis not present

## 2017-10-22 DIAGNOSIS — N72 Inflammatory disease of cervix uteri: Secondary | ICD-10-CM | POA: Diagnosis not present

## 2017-10-22 DIAGNOSIS — D270 Benign neoplasm of right ovary: Secondary | ICD-10-CM | POA: Diagnosis not present

## 2017-10-22 DIAGNOSIS — R102 Pelvic and perineal pain: Secondary | ICD-10-CM | POA: Diagnosis not present

## 2017-10-22 DIAGNOSIS — N930 Postcoital and contact bleeding: Secondary | ICD-10-CM | POA: Diagnosis not present

## 2017-10-22 LAB — CBC
HCT: 38.2 % (ref 35.0–47.0)
Hemoglobin: 12.8 g/dL (ref 12.0–16.0)
MCH: 31.3 pg (ref 26.0–34.0)
MCHC: 33.5 g/dL (ref 32.0–36.0)
MCV: 93.3 fL (ref 80.0–100.0)
PLATELETS: 212 10*3/uL (ref 150–440)
RBC: 4.1 MIL/uL (ref 3.80–5.20)
RDW: 14 % (ref 11.5–14.5)
WBC: 12.1 10*3/uL — AB (ref 3.6–11.0)

## 2017-10-22 LAB — SURGICAL PATHOLOGY

## 2017-10-22 LAB — BASIC METABOLIC PANEL
ANION GAP: 3 — AB (ref 5–15)
BUN: 9 mg/dL (ref 6–20)
CALCIUM: 8.9 mg/dL (ref 8.9–10.3)
CO2: 31 mmol/L (ref 22–32)
Chloride: 103 mmol/L (ref 98–111)
Creatinine, Ser: 0.6 mg/dL (ref 0.44–1.00)
GFR calc Af Amer: >60 mL/min (ref >60)
Glucose, Bld: 153 mg/dL — ABNORMAL HIGH (ref 70–99)
Potassium: 4.5 mmol/L (ref 3.5–5.1)
SODIUM: 137 mmol/L (ref 135–145)

## 2017-10-22 LAB — POCT I-STAT 4, (NA,K, GLUC, HGB,HCT)
GLUCOSE: 97 mg/dL (ref 70–99)
HCT: 43 % (ref 36.0–46.0)
Hemoglobin: 14.6 g/dL (ref 12.0–15.0)
Potassium: 3.9 mmol/L (ref 3.5–5.1)
SODIUM: 138 mmol/L (ref 135–145)

## 2017-10-22 NOTE — Progress Notes (Signed)
Patient discharged home with father. Discharge instructions and prescriptions given and reviewed with patient. Patient verbalized understanding. Will be escorted out by auxillary.

## 2017-10-22 NOTE — Discharge Summary (Signed)
 Physician Discharge Summary  Patient ID: Sheryl Barker MRN: [id number] DOB/AGE: 06-12-1975 41 y.o.  Admit date: 10/21/2017 Discharge date:   Admission Diagnoses:postcoital bleeding , right pelvic pain and right ovarian cyst   Discharge Diagnoses: same  Active Problems:   Postoperative state   Discharged Condition: good  Hospital Course: underwent an uncomplicated LAVH RSO and left salpingectomy , cystoscopy . Pain adequately control postop . Marland Kitchen   Consults: None  Significant Diagnostic Studies: labs:  Results for orders placed or performed during the hospital encounter of 10/21/17 (from the past 72 hour(s))  ABO/Rh     Status: None   Collection Time: 10/21/17  6:49 AM  Result Value Ref Range   ABO/RH(D)      O POS Performed at [facility], [address] [place], [place] [address]   CBC     Status: Abnormal   Collection Time:   5:20 AM  Result Value Ref Range   WBC 12.1 (H) 3.6 - 11.0 K/uL   RBC 4.10 3.80 - 5.20 MIL/uL   Hemoglobin 12.8 12.0 - 16.0 g/dL   HCT 38.2 35.0 - 47.0 %   MCV 93.3 80.0 - 100.0 fL   MCH 31.3 26.0 - 34.0 pg   MCHC 33.5 32.0 - 36.0 g/dL   RDW 14.0 11.5 - 14.5 %   Platelets 212 150 - 440 K/uL    Comment: Performed at [facility], [address] [place], [place] [address]  Basic metabolic panel     Status: Abnormal   Collection Time:   5:20 AM  Result Value Ref Range   Sodium 137 135 - 145 mmol/L   Potassium 4.5 3.5 - 5.1 mmol/L   Chloride 103 98 - 111 mmol/L   CO2 31 22 - 32 mmol/L   Glucose, Bld 153 (H) 70 - 99 mg/dL   BUN 9 6 - 20 mg/dL   Creatinine, Ser 0.60 0.44 - 1.00 mg/dL   Calcium 8.9 8.9 - 10.3 mg/dL   GFR calc non Af Amer >60 >60 mL/min   GFR calc Af Amer >60 >60 mL/min    Comment: (NOTE) The eGFR has been calculated using the CKD EPI equation. This calculation has not been validated in all clinical situations. eGFR's persistently <60 mL/min signify possible Chronic  Kidney Disease.    Anion gap 3 (L) 5 - 15    Comment: Performed at [facility], [address] [place], [place] [address]    Treatments: surgery: as above  Discharge Exam: Blood pressure 90/69, pulse 71, temperature 98.3 F (36.8 C), temperature source Oral, resp. rate 17, height 5' 5.5" (1.664 m), weight 94.3 kg, SpO2 94 %. Lungs CTA   CV RRR  Abdomen : soft NT   incision site C/D/I   Disposition:    Allergies as of       Reactions   Tramadol Nausea And Vomiting   Hydrocodone-acetaminophen Itching   Itching w/o a rash.   Morphine And Related Palpitations   Increased heart rate, "thought I was having a heart attack".   Tylenol [acetaminophen]    Unable to take due to elevated liver enzymes      Medication List    STOP taking these medications   naproxen sodium 220 MG tablet Commonly known as:  ALEVE     TAKE these medications   bisacodyl 5 MG EC tablet Commonly known as:  DULCOLAX Take 2 tablets (10 mg total) by mouth at bedtime as needed for moderate constipation ((  Hold for loose stool)).   CALCIUM+D3 PO Take 1 tablet by mouth at bedtime.   DULoxetine 60 MG capsule Commonly known as:  CYMBALTA Take 60 mg by mouth at bedtime.   NEURONTIN 300 MG capsule Generic drug:  gabapentin Take 1 capsule (300 mg total) by mouth 3 (three) times daily. In addition take 1-3 capsules at bedtime.   oxyCODONE 5 MG immediate release tablet Commonly known as:  Oxy IR/ROXICODONE Take 1 tablet (5 mg total) by mouth every 6 (six) hours as needed for severe pain. Must last 30 days. What changed:  reasons to take this      Follow-up Information    Shadi Larner, Gwen Her, MD Follow up in 2 week(s).   Specialty:  Obstetrics and Gynecology Contact information: [address] [place] [address] [phone]           Signed: Gwen Her Chan Rosasco , 8:59 AM

## 2017-10-22 NOTE — Plan of Care (Signed)
Alert and oriented with aprop. Affect.  Afeb. Has been on 2L/ since Post-Op and with O2 sat's maintained at 94-98%. BBS clear. Pt. Does I.S. With encouragement.  Surgical incisions are well approximated with surgical glue. Pt, has had stated pain control with IV Demerol X1 and P.O. Demerol X1. Pt. States she has passed flatus X1. She has had scant vaginal bleeding.  Pt. Has been assisted with ambulation up to the bathroom and tolerated well. She has voided X2 this shift with a volume of 200cc each time. Pt. Is tolerating a regular diet.

## 2017-11-03 DEATH — deceased

## 2019-03-30 IMAGING — CR DG THORACIC SPINE 2V
1 series · 3 of 3 positions shown · non-contrast
Comparison: None.

CLINICAL DATA: Back pain with BILATERAL sciatica.

EXAM:
THORACIC SPINE 2 VIEWS

[Series 1: dg thoracic spine 2 view · 0.14mm/px · 3 of 3 slices shown]
[im 1/3]
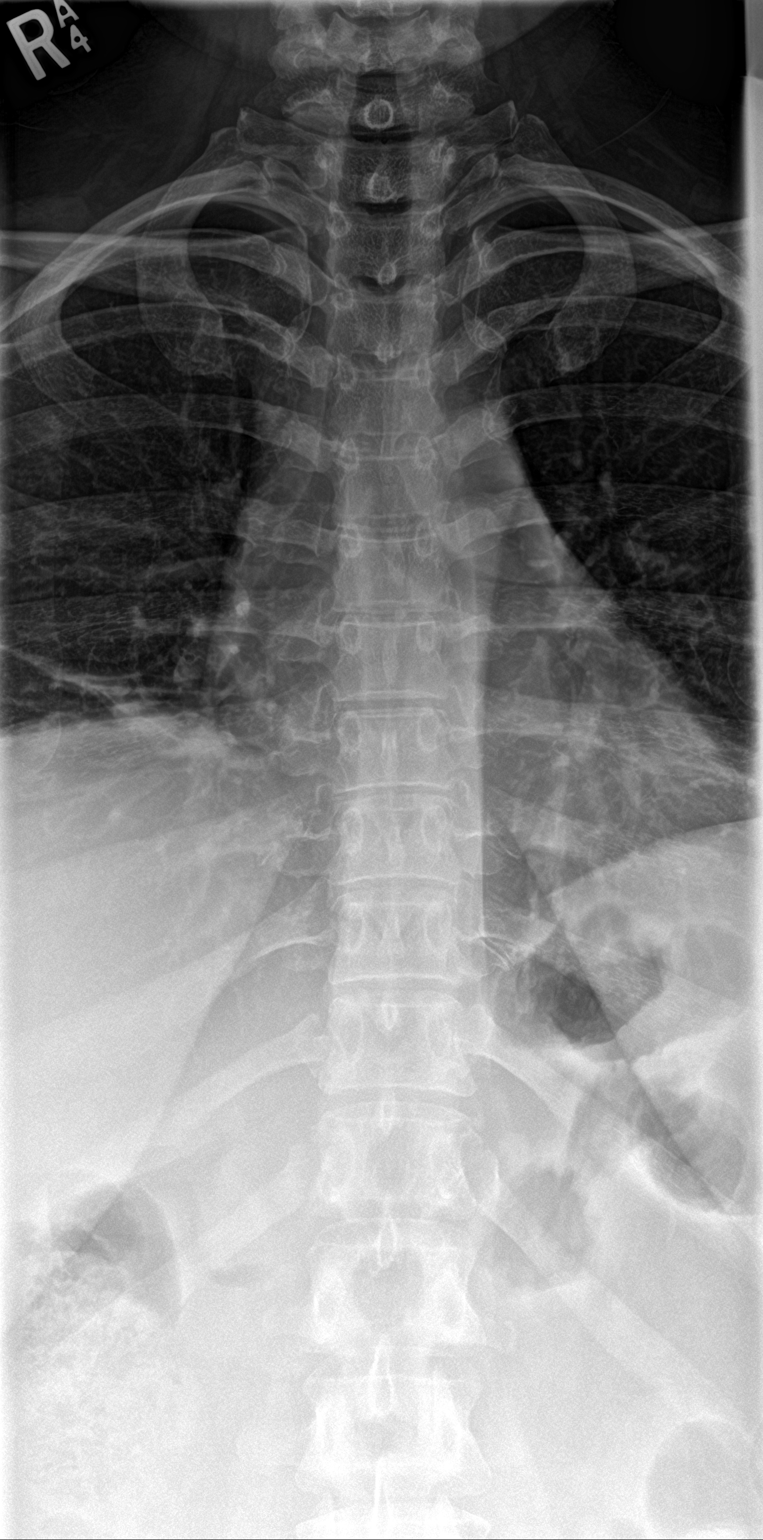
[im 2/3]
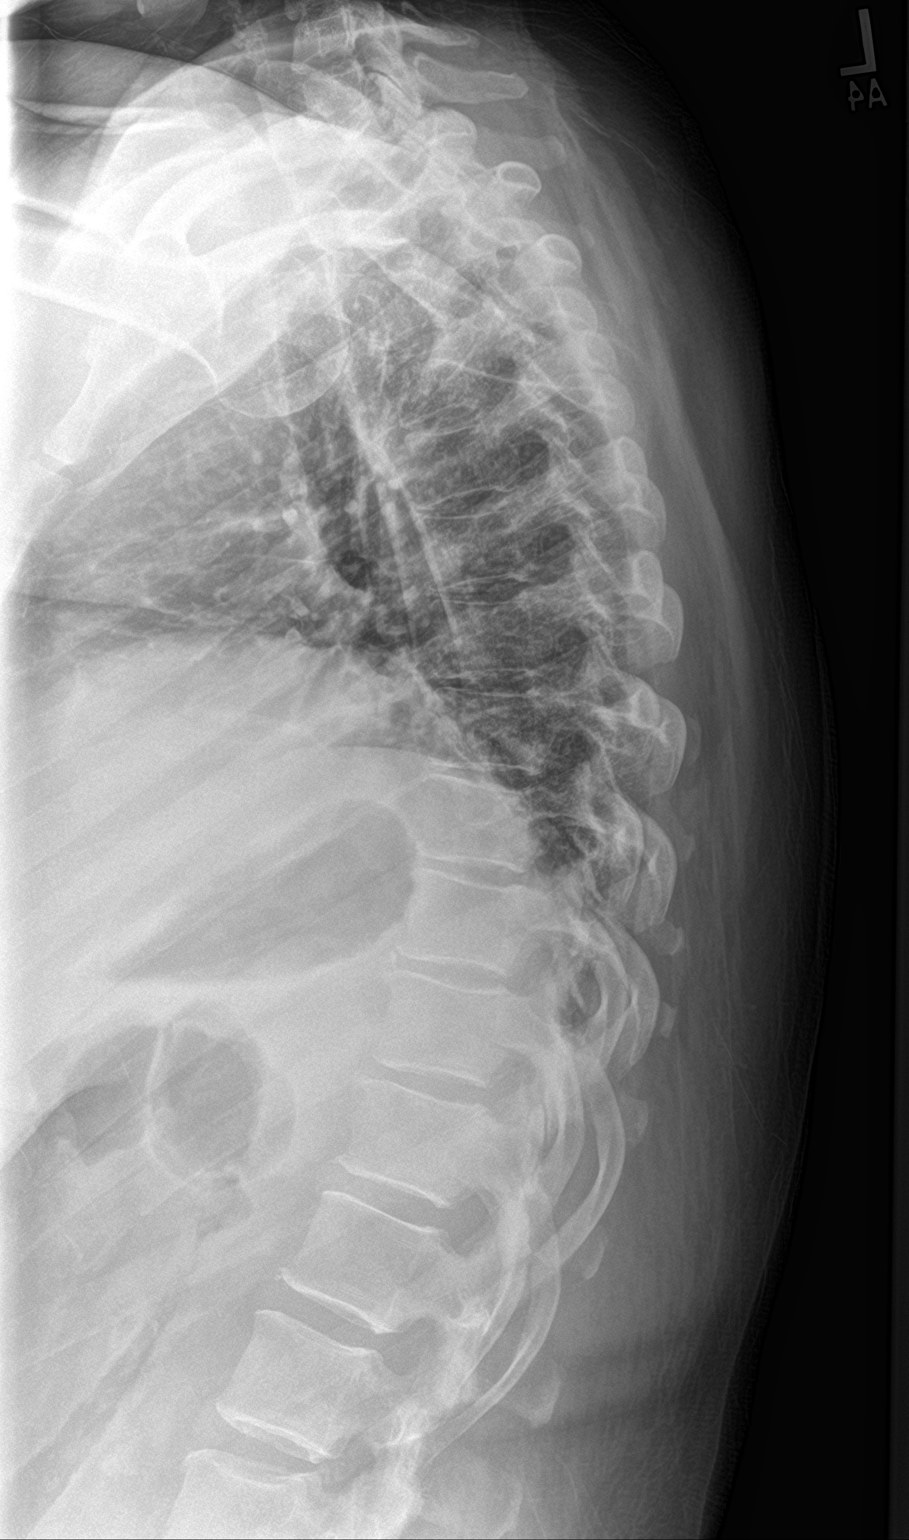
[im 3/3]
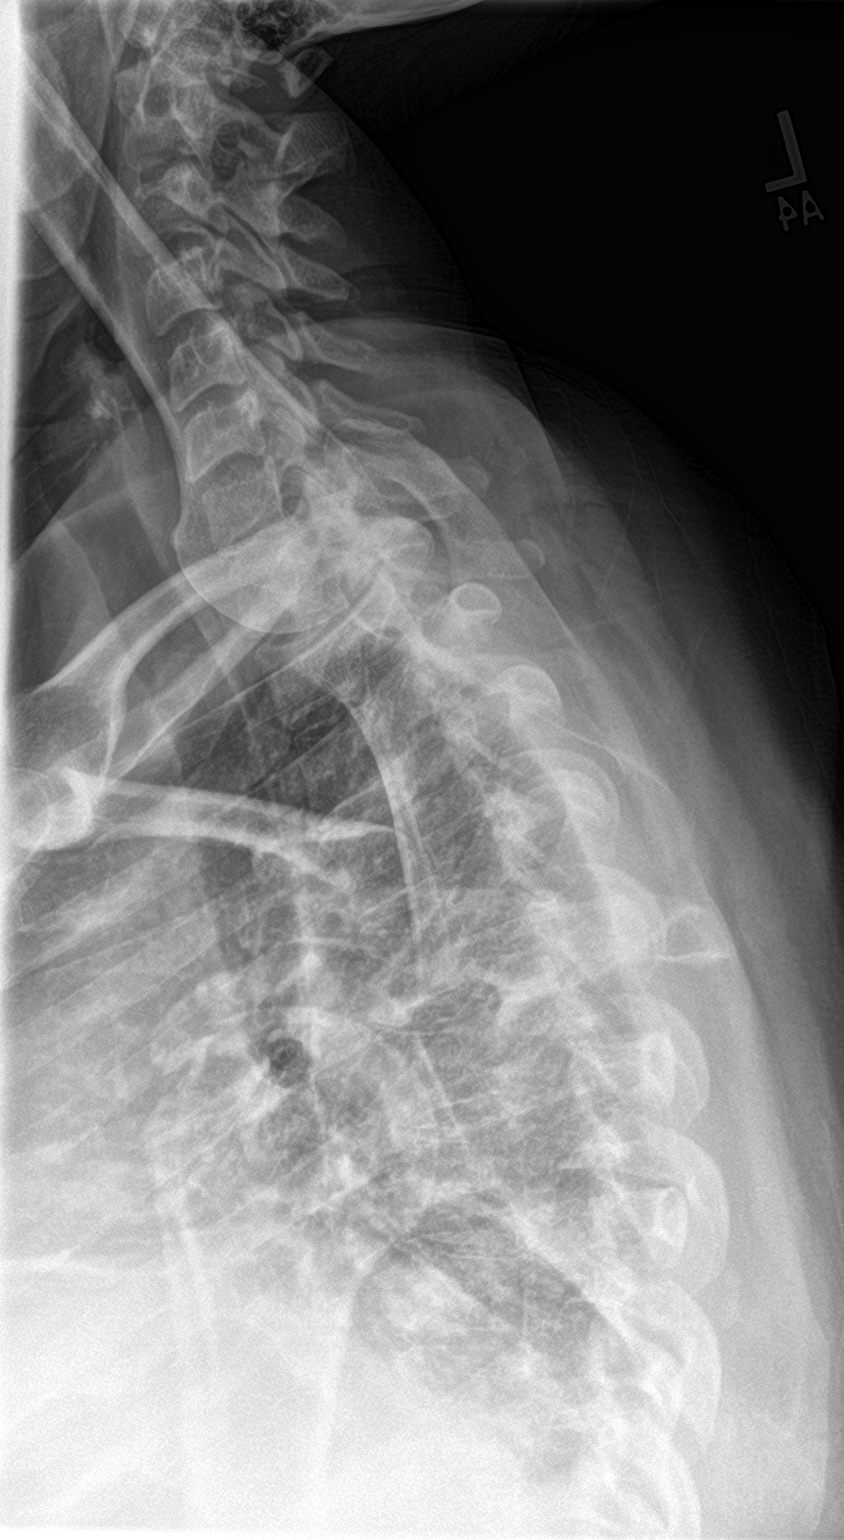

[3 of 3 positions shown; findings below may reference images not displayed]

FINDINGS: There is no evidence of thoracic spine fracture. Alignment is
normal. No other significant bone abnormalities are identified.
Incidental RIGHT lung base scarring, incompletely evaluated.
IMPRESSION: Negative for thoracic spine abnormality.

## 2019-03-30 IMAGING — CR DG LUMBAR SPINE COMPLETE W/ BEND
1 series · 7 of 7 positions shown · non-contrast
Comparison: MRI lumbar spine 08/13/2014.

CLINICAL DATA: Low back pain.  BILATERAL sciatica.

EXAM:
LUMBAR SPINE - COMPLETE WITH BENDING VIEWS

[Series 1: dg lumbar spine complete w/bend 6+v · 0.14mm/px · 7 of 7 slices shown]
[im 1/7]
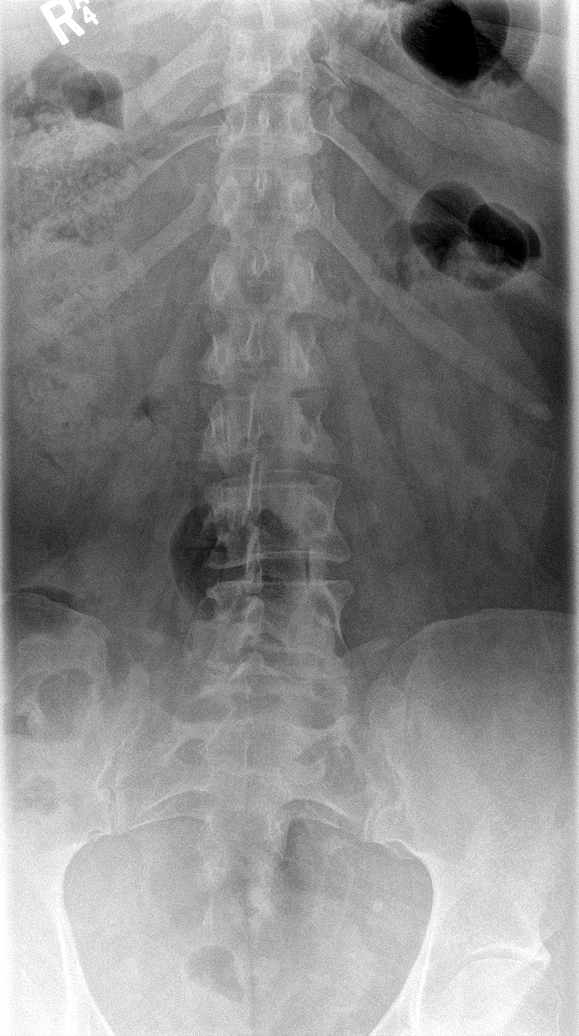
[im 2/7]
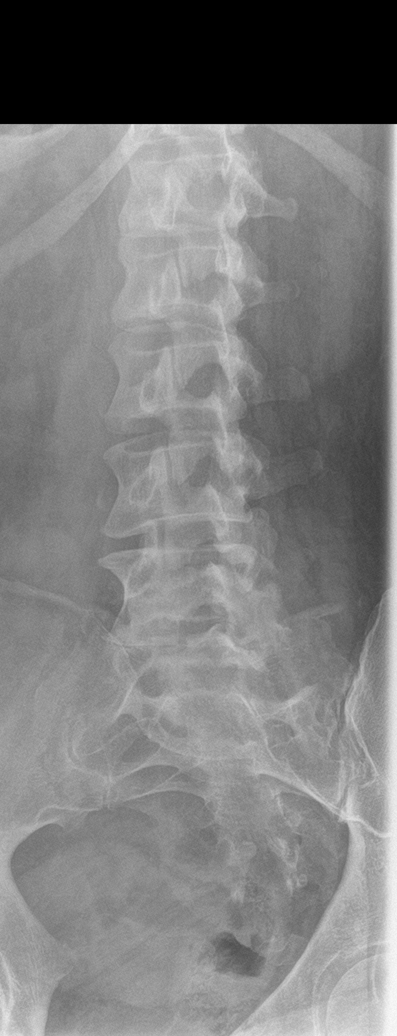
[im 3/7]
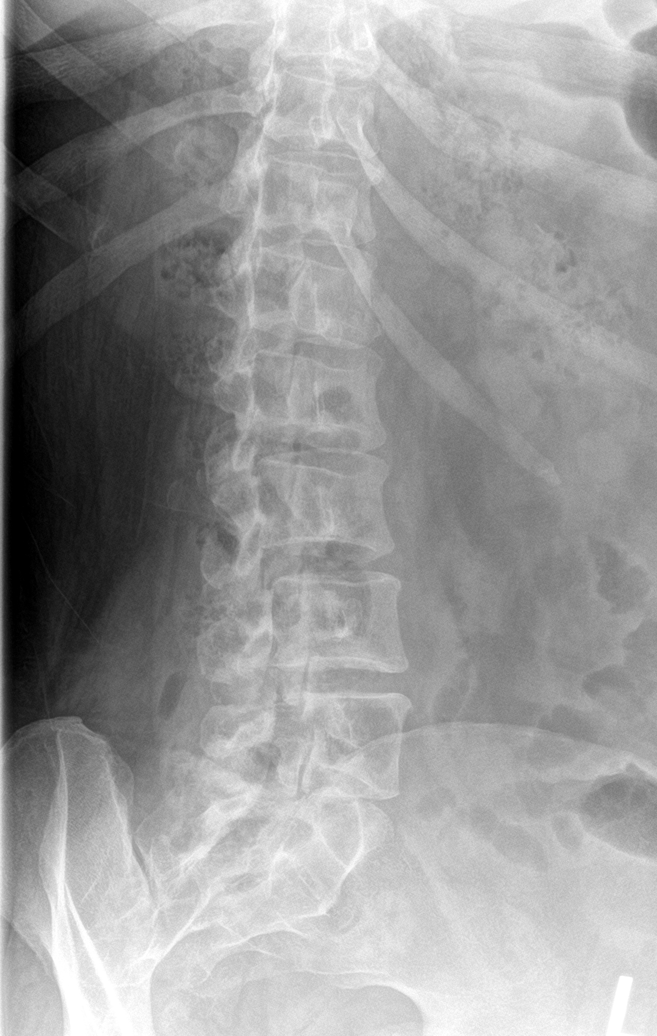
[im 4/7]
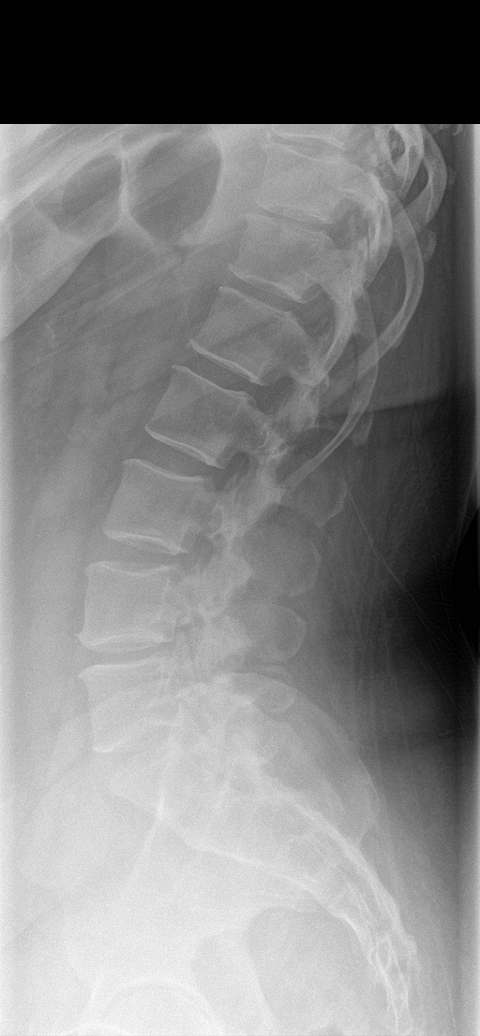
[im 5/7]
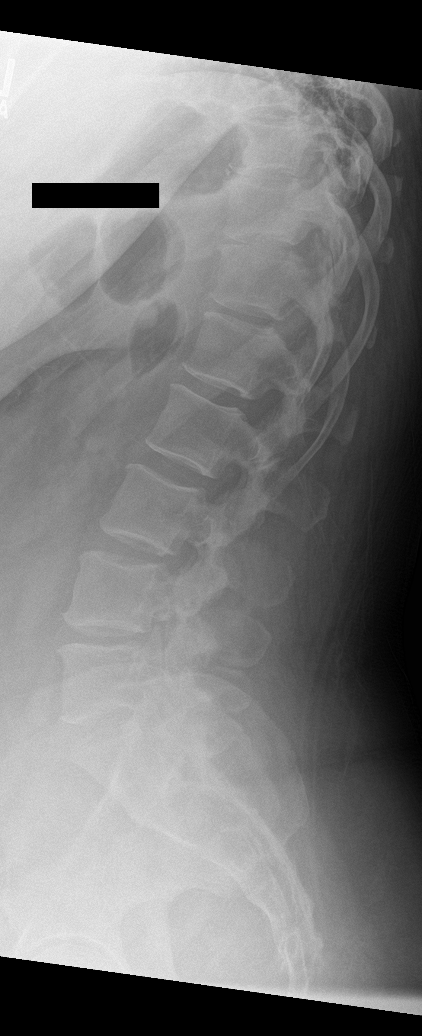
[im 6/7]
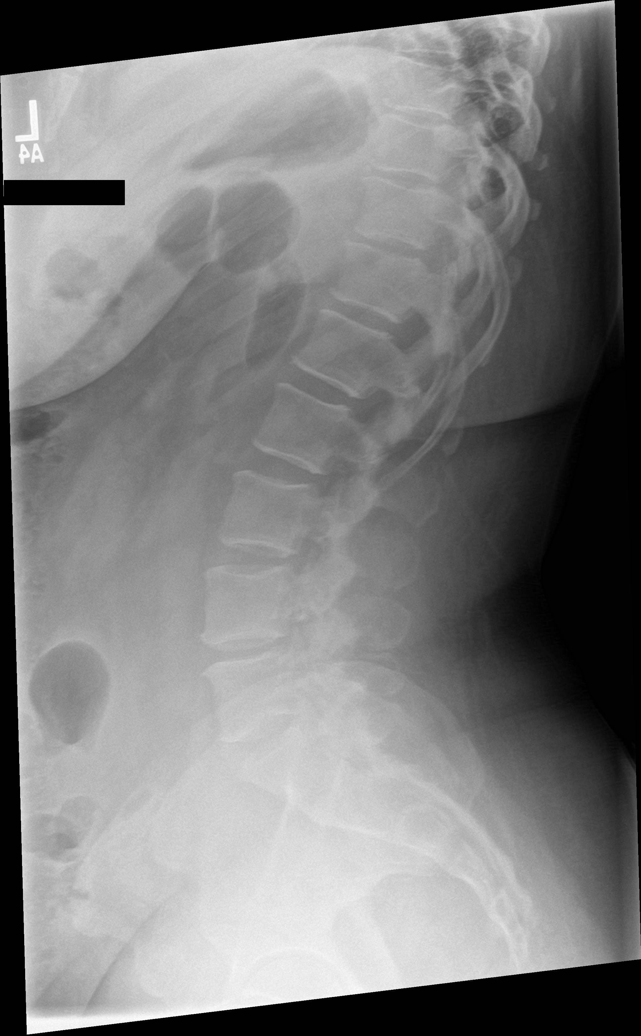
[im 7/7]
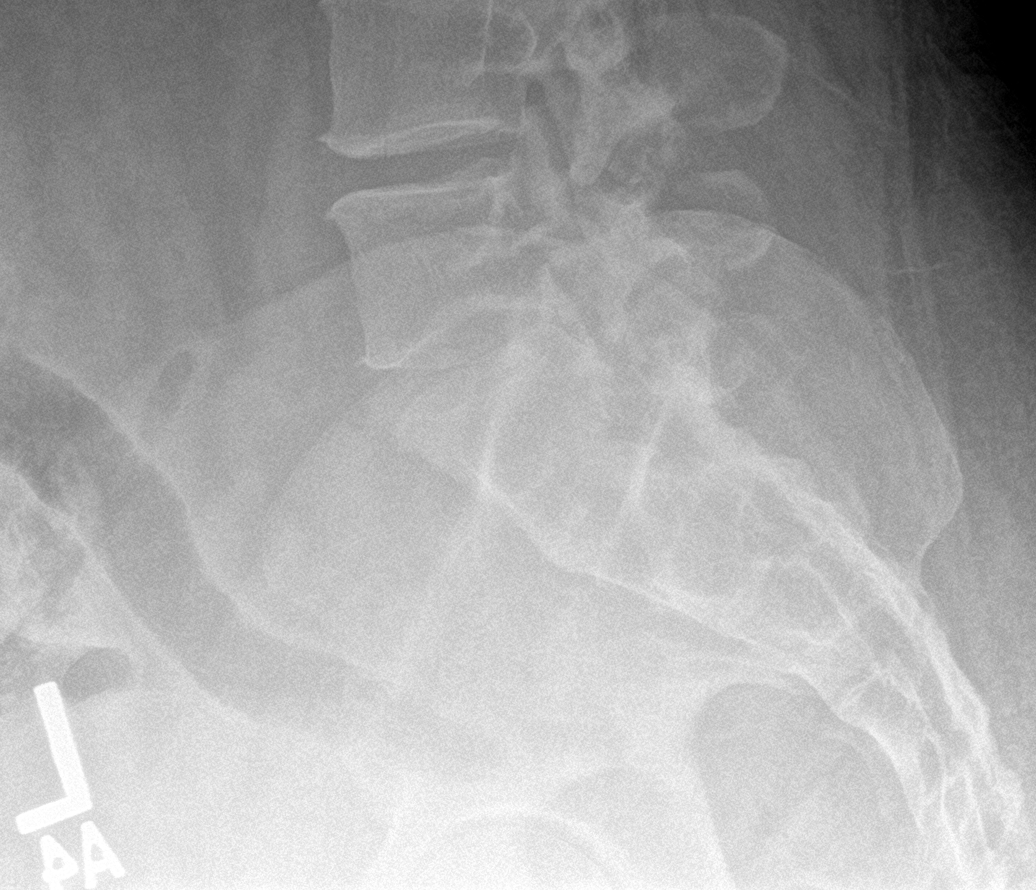

[7 of 7 positions shown; findings below may reference images not displayed]

FINDINGS: There is no evidence of lumbar spine fracture. Alignment is normal.
Intervertebral disc spaces are maintained. Flexion extension views
demonstrate no abnormal movement. Lower lumbar facet arthropathy
without pars defects noted at L4-5 and L5-S1.
IMPRESSION: Preserved intervertebral disc height. Lower lumbar facet
arthropathy. Normal alignment without dynamic instability.

## 2019-04-02 IMAGING — CR DG CERVICAL SPINE 2 OR 3 VIEWS
1 series · 4 of 4 positions shown · non-contrast
Comparison: None.

CLINICAL DATA: Cervical and thoracic back pain after fall down
stairs today.

EXAM:
CERVICAL SPINE - 2-3 VIEW

[Series 1: dg cervical spine 2 or 3 views · 0.14mm/px · 4 of 4 slices shown]
[im 1/4]
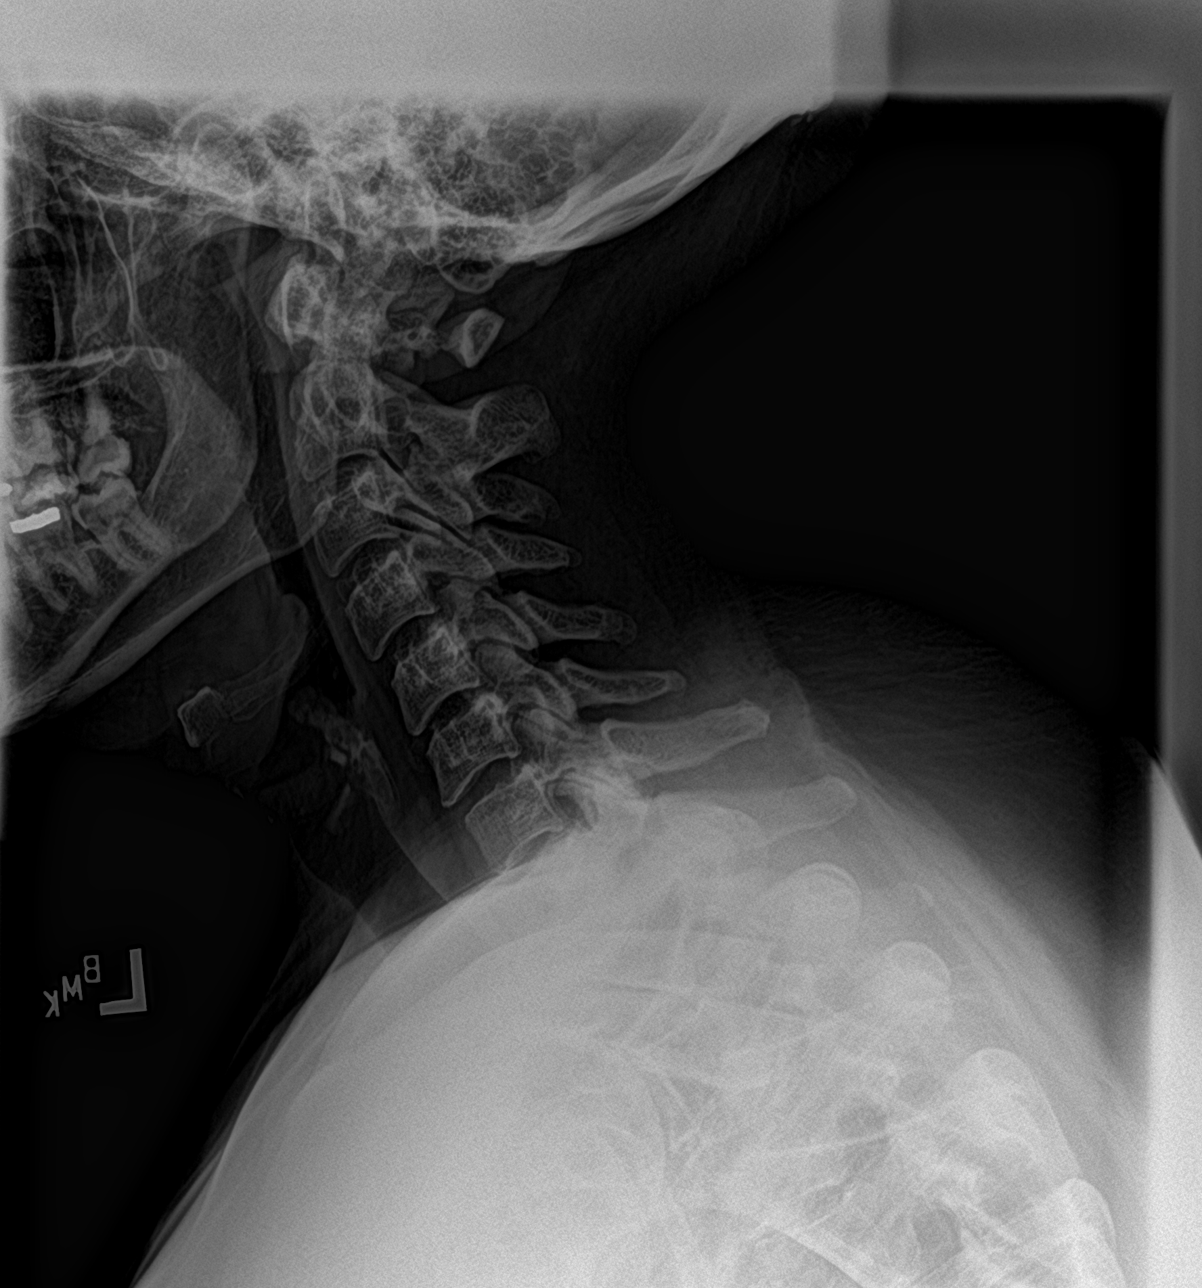
[im 2/4]
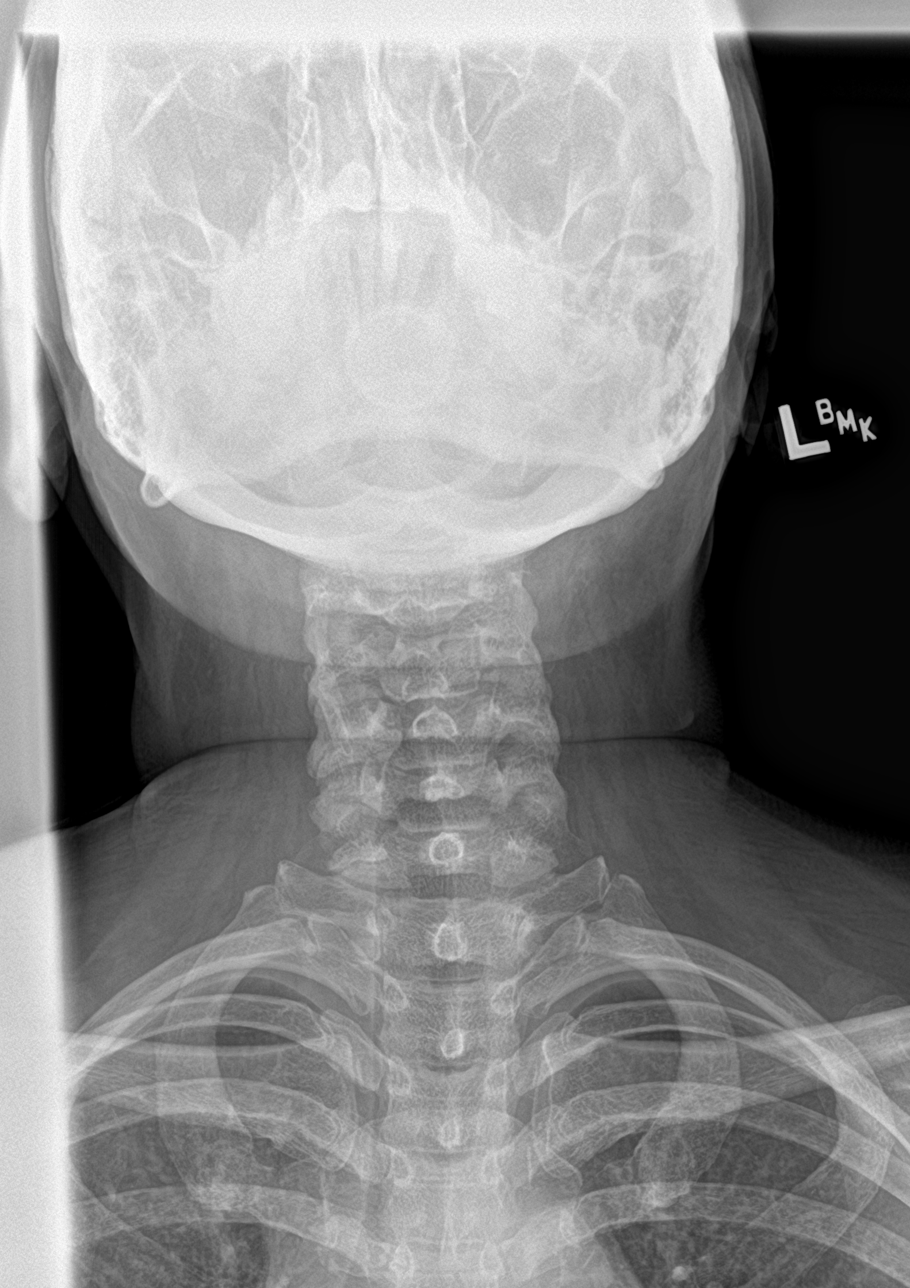
[im 3/4]
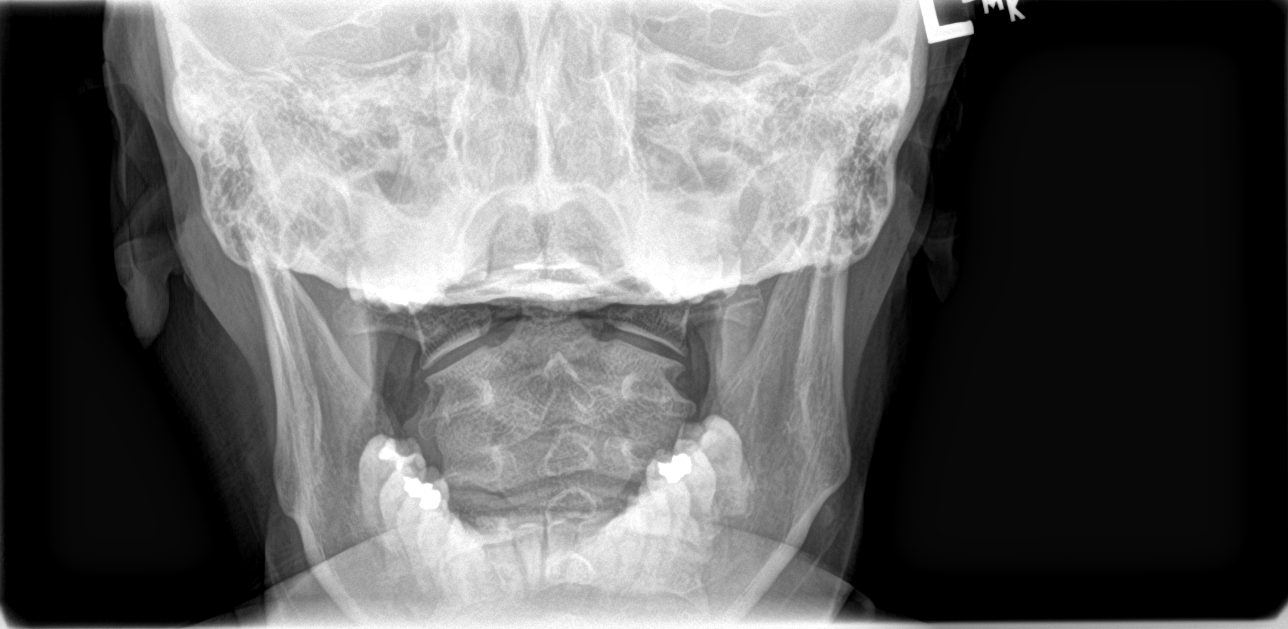
[im 4/4]
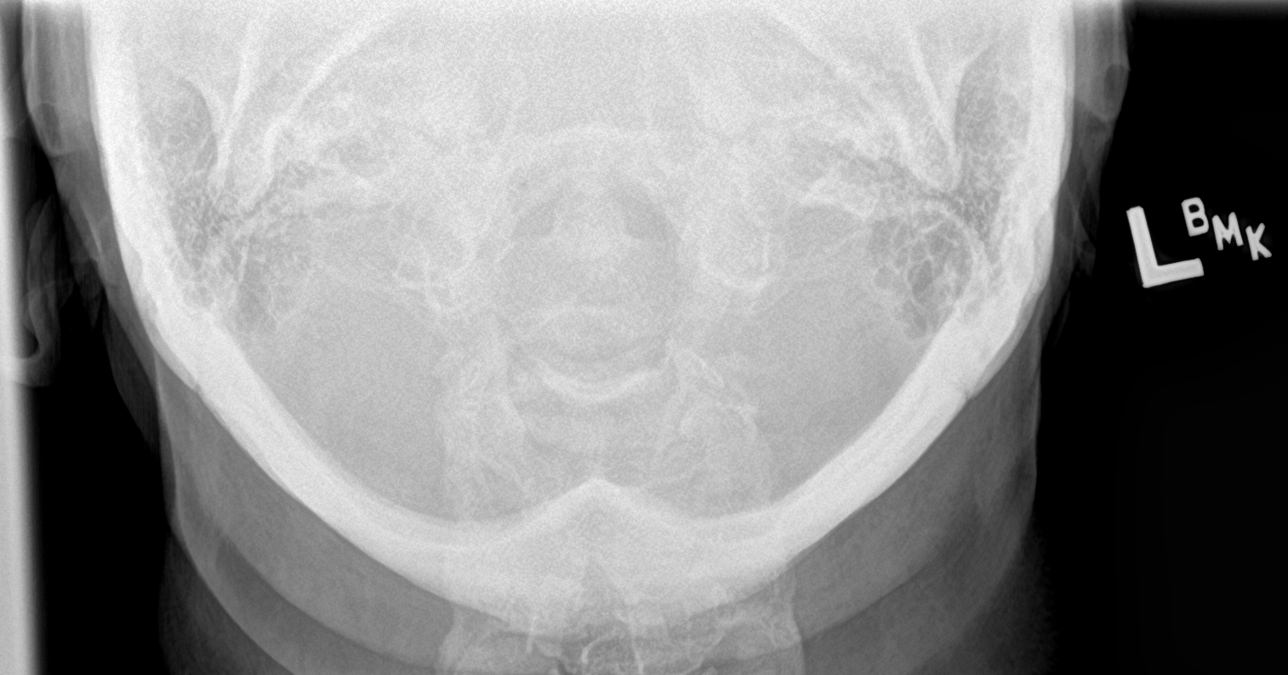

[4 of 4 positions shown; findings below may reference images not displayed]

FINDINGS: Cervical spine alignment is maintained. Vertebral body heights and
intervertebral disc spaces are preserved. The dens is intact.
Posterior elements appear well-aligned. There is no evidence of
fracture. No prevertebral soft tissue edema.
IMPRESSION: Negative cervical spine radiographs.
